# Patient Record
Sex: Female | Born: 1950 | Race: White | Hispanic: No | Smoking: Never smoker
Health system: Southern US, Community
[De-identification: ages and names within clinical notes are randomized; demographics above are authoritative.]

## PROBLEM LIST (undated history)

## (undated) DIAGNOSIS — M810 Age-related osteoporosis without current pathological fracture: Secondary | ICD-10-CM

## (undated) DIAGNOSIS — M069 Rheumatoid arthritis, unspecified: Secondary | ICD-10-CM

## (undated) DIAGNOSIS — J90 Pleural effusion, not elsewhere classified: Secondary | ICD-10-CM

## (undated) DIAGNOSIS — E7800 Pure hypercholesterolemia, unspecified: Secondary | ICD-10-CM

## (undated) DIAGNOSIS — G43909 Migraine, unspecified, not intractable, without status migrainosus: Secondary | ICD-10-CM

## (undated) DIAGNOSIS — C439 Malignant melanoma of skin, unspecified: Secondary | ICD-10-CM

## (undated) DIAGNOSIS — J189 Pneumonia, unspecified organism: Secondary | ICD-10-CM

## (undated) HISTORY — DX: Migraine, unspecified, not intractable, without status migrainosus: G43.909

## (undated) HISTORY — PX: FINGER SURGERY: SHX640

## (undated) HISTORY — DX: Pure hypercholesterolemia, unspecified: E78.00

## (undated) HISTORY — DX: Age-related osteoporosis without current pathological fracture: M81.0

## (undated) HISTORY — DX: Rheumatoid arthritis, unspecified: M06.9

## (undated) HISTORY — DX: Malignant melanoma of skin, unspecified: C43.9

---

## 1998-01-04 ENCOUNTER — Other Ambulatory Visit: Admission: RE | Admit: 1998-01-04 | Discharge: 1998-01-04 | Payer: Self-pay | Admitting: Obstetrics and Gynecology

## 1999-08-08 ENCOUNTER — Other Ambulatory Visit: Admission: RE | Admit: 1999-08-08 | Discharge: 1999-08-08 | Payer: Self-pay | Admitting: Obstetrics and Gynecology

## 2002-08-02 ENCOUNTER — Other Ambulatory Visit: Admission: RE | Admit: 2002-08-02 | Discharge: 2002-08-02 | Payer: Self-pay | Admitting: Obstetrics and Gynecology

## 2002-10-27 ENCOUNTER — Ambulatory Visit (HOSPITAL_BASED_OUTPATIENT_CLINIC_OR_DEPARTMENT_OTHER): Admission: RE | Admit: 2002-10-27 | Discharge: 2002-10-27 | Payer: Self-pay | Admitting: Orthopedic Surgery

## 2002-10-28 ENCOUNTER — Encounter (INDEPENDENT_AMBULATORY_CARE_PROVIDER_SITE_OTHER): Payer: Self-pay | Admitting: Specialist

## 2004-03-05 ENCOUNTER — Other Ambulatory Visit: Admission: RE | Admit: 2004-03-05 | Discharge: 2004-03-05 | Payer: Self-pay | Admitting: Obstetrics and Gynecology

## 2005-03-14 ENCOUNTER — Ambulatory Visit: Payer: Self-pay | Admitting: Sports Medicine

## 2005-04-25 ENCOUNTER — Ambulatory Visit: Payer: Self-pay | Admitting: Sports Medicine

## 2005-07-30 ENCOUNTER — Ambulatory Visit (HOSPITAL_COMMUNITY): Admission: RE | Admit: 2005-07-30 | Discharge: 2005-07-30 | Payer: Self-pay | Admitting: Gastroenterology

## 2006-09-07 ENCOUNTER — Other Ambulatory Visit: Admission: RE | Admit: 2006-09-07 | Discharge: 2006-09-07 | Payer: Self-pay | Admitting: Obstetrics and Gynecology

## 2009-10-02 ENCOUNTER — Ambulatory Visit: Payer: Self-pay | Admitting: Obstetrics and Gynecology

## 2009-10-15 ENCOUNTER — Other Ambulatory Visit: Admission: RE | Admit: 2009-10-15 | Discharge: 2009-10-15 | Payer: Self-pay | Admitting: Obstetrics and Gynecology

## 2009-10-15 ENCOUNTER — Ambulatory Visit: Payer: Self-pay | Admitting: Obstetrics and Gynecology

## 2009-12-04 ENCOUNTER — Ambulatory Visit: Payer: Self-pay | Admitting: Obstetrics and Gynecology

## 2009-12-07 ENCOUNTER — Ambulatory Visit: Payer: Self-pay | Admitting: Obstetrics and Gynecology

## 2010-11-29 NOTE — Op Note (Signed)
NAMEMELORA, MENON                              ACCOUNT NO.:  [id number]   MEDICAL RECORD NO.:  [id number]                   PATIENT TYPE:  AMB   LOCATION:  DSC                                  FACILITY:  [facility]   PHYSICIAN:  Katy Fitch. Naaman Plummer., M.D.          DATE OF BIRTH:  [birth year]   DATE OF PROCEDURE:  10/27/2002  DATE OF DISCHARGE:                                 OPERATIVE REPORT   PREOPERATIVE DIAGNOSIS:  Ossifying mass, right long finger volar aspect P2.   POSTOPERATIVE DIAGNOSIS:  Ossifying mass, right long finger volar aspect P2.   OPERATION PERFORMED:  Excisional biopsy of an ossifying mass on volar radial  aspect of right long finger involving neurovascular bundle, rule out  probable ossifying chondroma.   SURGEON:  Katy Fitch. Sypher, M.D.   ASSISTANT:  Jonni Sanger, P.A.   ANESTHESIA:  IV regional forearm level.   SUPERVISING ANESTHESIOLOGIST:  Judie Petit, M.D.   INDICATIONS FOR PROCEDURE:  The patient is a 60 year old woman who presented  for evaluation and management of a firm mass on the volar aspect of her  right long finger.  She had a history of this enlarging over the past  several years.  This was painless.  She had no history of injury,  specifically, no history of a penetrating injury.   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  After informed consent, she was brought to  the operating room at this time for excisional biopsy.   DESCRIPTION OF PROCEDURE:  The patient was brought to the operating room and  placed in supine position upon the operating table.  Following placement of  an IV regional block at the proximal forearm level, the right arm was  prepped with Betadine soap and solution and sterilely draped.  Following  confirmation of satisfactory anesthesia, a Brunner zigzag incision was  fashioned in a manner to allow VY flap creation to reduce the skin mass.  Subcutaneous  tissues were carefully divided taking care to identify the  radial neurovascular bundle.  The mass was circumferentially dissected and  found to have a pedicle emanating from the volar aspect of the PIP joint.  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   This was circumferentially dissected and gently teased off the radial proper  digital nerve.  The wound was then irrigated.  A delta shaped portion of the  skin was removed to relieve the redundant dermis followed by repair of the  skin with a corner suture of 5-0 nylon and horizontal mattress suture of 5-0  nylon.  Skin was dressed with Xeroflo, sterile gauze and Coban.   For aftercare, the patient was given a prescription for Vicodin p.r.n. pain.  She will return to our office in follow-up for one week.  Pathology results  should be available within four days.                                               Katy Fitch Naaman Plummer., M.D.    RVS/MEDQ  D:  10/27/2002  T:  10/27/2002  Job:  [id number]

## 2010-11-29 NOTE — Op Note (Signed)
[name] [name], ROGEL                  ACCOUNT NO.:  [id number]   MEDICAL RECORD NO.:  [id number]          PATIENT TYPE:  AMB   LOCATION:  ENDO                         FACILITY:  [facility]   PHYSICIAN:  Danise Edge, M.D.   DATE OF BIRTH:  [birth year]   DATE OF PROCEDURE:  07/30/2005  DATE OF DISCHARGE:                                 OPERATIVE REPORT   PROCEDURE:  Screening colonoscopy.Marland Kitchen   REFERRING PHYSICIAN:  Erskine Speed, M.D. and Rande Brunt. Eda Paschal, M.D.   INDICATIONS:  Ms. Lilinoe Acklin is a 60 year old female born May 11, 1951.  Ms. Wilmes is scheduled to undergo her first screening colonoscopy with  polypectomy to prevent colon cancer.   ENDOSCOPIST:  Danise Edge, M.D.   PREMEDICATION:  Versed 8.5 mg, Demerol 60 mg.   DESCRIPTION OF PROCEDURE:  After obtaining informed consent, Ms. Tocci was  placed in the left lateral decubitus position. I administered intravenous  Demerol and intravenous Versed to achieve conscious sedation for the  procedure. The patient's blood pressure, oxygen saturation and cardiac  rhythm were monitored throughout the procedure and documented in the medical  record.   Anal inspection and digital rectal exam were normal. The Olympus adjustable  pediatric colonoscope was introduced into the rectum and advanced to the  cecum. A normal-appearing ileocecal valve and appendiceal orifice were  identified. Colonic preparation for the exam today was excellent.   RECTUM:  Normal. Retroflexed view of the distal rectum normal.  SIGMOID COLON AND DESCENDING COLON:  Normal.  SPLENIC FLEXURE:  Normal.  TRANSVERSE COLON:  Normal.  HEPATIC FLEXURE:  Normal.  ASCENDING COLON:  Normal.  CECUM AND ILEOCECAL VALVE:  Normal.   ASSESSMENT:  Normal screening proctocolonoscopy to the cecum. No endoscopic  evidence for the presence of colorectal neoplasia.           ______________________________  Danise Edge, M.D.     MJ/MEDQ  D:  07/30/2005  T:  07/30/2005   Job:  [id number]   cc:   Erskine Speed, M.D.  Fax: [phone]   Rande Brunt. Eda Paschal, M.D.  Fax: [phone]

## 2010-12-16 ENCOUNTER — Encounter (INDEPENDENT_AMBULATORY_CARE_PROVIDER_SITE_OTHER): Payer: BC Managed Care – PPO | Admitting: Obstetrics and Gynecology

## 2010-12-16 DIAGNOSIS — N390 Urinary tract infection, site not specified: Secondary | ICD-10-CM

## 2010-12-16 DIAGNOSIS — R82998 Other abnormal findings in urine: Secondary | ICD-10-CM

## 2010-12-23 ENCOUNTER — Other Ambulatory Visit: Payer: BC Managed Care – PPO

## 2011-01-16 ENCOUNTER — Other Ambulatory Visit: Payer: Self-pay | Admitting: Internal Medicine

## 2011-01-16 DIAGNOSIS — R29810 Facial weakness: Secondary | ICD-10-CM

## 2011-01-16 DIAGNOSIS — H547 Unspecified visual loss: Secondary | ICD-10-CM

## 2011-01-17 ENCOUNTER — Ambulatory Visit
Admission: RE | Admit: 2011-01-17 | Discharge: 2011-01-17 | Disposition: A | Discharge status: Home / Self Care | Payer: BC Managed Care – PPO | Source: Ambulatory Visit | Attending: Internal Medicine | Admitting: Internal Medicine

## 2011-01-17 DIAGNOSIS — R29810 Facial weakness: Secondary | ICD-10-CM

## 2011-01-17 DIAGNOSIS — H547 Unspecified visual loss: Secondary | ICD-10-CM

## 2011-01-20 ENCOUNTER — Other Ambulatory Visit: Payer: BC Managed Care – PPO

## 2011-01-23 ENCOUNTER — Ambulatory Visit (INDEPENDENT_AMBULATORY_CARE_PROVIDER_SITE_OTHER): Payer: BC Managed Care – PPO | Admitting: Obstetrics and Gynecology

## 2011-01-23 DIAGNOSIS — R350 Frequency of micturition: Secondary | ICD-10-CM

## 2011-01-23 DIAGNOSIS — N390 Urinary tract infection, site not specified: Secondary | ICD-10-CM

## 2011-02-07 ENCOUNTER — Ambulatory Visit (INDEPENDENT_AMBULATORY_CARE_PROVIDER_SITE_OTHER): Payer: BC Managed Care – PPO | Admitting: Obstetrics and Gynecology

## 2011-02-07 DIAGNOSIS — Z5189 Encounter for other specified aftercare: Secondary | ICD-10-CM

## 2011-02-07 DIAGNOSIS — R82998 Other abnormal findings in urine: Secondary | ICD-10-CM

## 2011-03-11 ENCOUNTER — Encounter: Payer: Self-pay | Admitting: Gynecology

## 2011-03-11 DIAGNOSIS — N952 Postmenopausal atrophic vaginitis: Secondary | ICD-10-CM | POA: Insufficient documentation

## 2011-03-20 ENCOUNTER — Ambulatory Visit (INDEPENDENT_AMBULATORY_CARE_PROVIDER_SITE_OTHER): Payer: BC Managed Care – PPO | Admitting: Obstetrics and Gynecology

## 2011-03-20 ENCOUNTER — Other Ambulatory Visit (HOSPITAL_COMMUNITY)
Admission: RE | Admit: 2011-03-20 | Discharge: 2011-03-20 | Disposition: A | Discharge status: Home / Self Care | Payer: BC Managed Care – PPO | Source: Ambulatory Visit | Attending: Obstetrics and Gynecology | Admitting: Obstetrics and Gynecology

## 2011-03-20 ENCOUNTER — Encounter: Payer: Self-pay | Admitting: Obstetrics and Gynecology

## 2011-03-20 VITALS — BP 104/60 | Ht 66.5 in | Wt 140.0 lb

## 2011-03-20 DIAGNOSIS — Z01419 Encounter for gynecological examination (general) (routine) without abnormal findings: Secondary | ICD-10-CM | POA: Insufficient documentation

## 2011-03-20 DIAGNOSIS — N950 Postmenopausal bleeding: Secondary | ICD-10-CM

## 2011-03-20 NOTE — Progress Notes (Signed)
The patient came to see me today for an annual GYN exam. She does not take HRT to 3 weeks ago she had 2 or 3 episodes of light spotting. She's had none since. She is up-to-date on mammograms but we and get the report. She will have it sent to [place]. Her last bone density showed osteopenia without elevated fracture risk. She is having no pelvic pain.  Physical examination: HEENT within normal limits. Neck: Thyroid not large. No masses. Supraclavicular nodes: not enlarged. Breasts: Examined in both sitting midline position. No skin changes and no masses. Abdomen: Soft no guarding rebound or masses or hernia. Pelvic: External: Within normal limits. BUS: Within normal limits. Vaginal:within normal limits. Poor estrogen effect. No evidence of cystocele rectocele or enterocele. Cervix: clean. Uterus: Normal size and shape. Adnexa: No masses. Rectovaginal exam: Confirmatory and negative. Extremities: Within normal limits.  Assessment: Possible postmenopausal bleeding. Osteopenia.  Plan: Endometrial biopsy done. Patient had not gotten [place] a urine before the biopsy so we decided to wait till next week to do a urinalysis so it would not be contaminated. I will see her that same day and give her her biopsy report and we will decide what else needs to be done.

## 2011-03-21 NOTE — Progress Notes (Signed)
Addended byCammie Mcgee T on: 03/21/2011 02:16 PM   Modules accepted: Orders

## 2011-03-27 ENCOUNTER — Ambulatory Visit (INDEPENDENT_AMBULATORY_CARE_PROVIDER_SITE_OTHER): Payer: BC Managed Care – PPO | Admitting: Obstetrics and Gynecology

## 2011-03-27 DIAGNOSIS — R300 Dysuria: Secondary | ICD-10-CM

## 2011-03-27 DIAGNOSIS — R82998 Other abnormal findings in urine: Secondary | ICD-10-CM

## 2011-03-27 DIAGNOSIS — N950 Postmenopausal bleeding: Secondary | ICD-10-CM

## 2011-03-27 NOTE — Progress Notes (Signed)
Patient came back today for followup. She's had no more vaginal bleeding. We did a urinalysis and she did not have microscopic hematuria. Her Pap smear and endometrial biopsy were fine. We discussed proceeding with an SIH to rule out an occult  Polyp. Patient has elected to wait. She will call immediately if she has any bleeding and we will schedule SIH.

## 2012-08-31 ENCOUNTER — Other Ambulatory Visit: Payer: Self-pay | Admitting: Gynecology

## 2012-08-31 DIAGNOSIS — M8580 Other specified disorders of bone density and structure, unspecified site: Secondary | ICD-10-CM

## 2012-09-02 ENCOUNTER — Ambulatory Visit (INDEPENDENT_AMBULATORY_CARE_PROVIDER_SITE_OTHER): Payer: BC Managed Care – PPO

## 2012-09-02 DIAGNOSIS — M8580 Other specified disorders of bone density and structure, unspecified site: Secondary | ICD-10-CM

## 2012-09-02 DIAGNOSIS — M899 Disorder of bone, unspecified: Secondary | ICD-10-CM

## 2012-09-06 ENCOUNTER — Encounter: Payer: Self-pay | Admitting: Gynecology

## 2012-09-24 ENCOUNTER — Encounter: Payer: Self-pay | Admitting: *Deleted

## 2012-10-20 ENCOUNTER — Ambulatory Visit (INDEPENDENT_AMBULATORY_CARE_PROVIDER_SITE_OTHER): Payer: BC Managed Care – PPO | Admitting: Gynecology

## 2012-10-20 ENCOUNTER — Encounter: Payer: Self-pay | Admitting: Gynecology

## 2012-10-20 VITALS — BP 120/76 | Ht 66.0 in | Wt 139.0 lb

## 2012-10-20 DIAGNOSIS — M949 Disorder of cartilage, unspecified: Secondary | ICD-10-CM

## 2012-10-20 DIAGNOSIS — N952 Postmenopausal atrophic vaginitis: Secondary | ICD-10-CM

## 2012-10-20 DIAGNOSIS — M8580 Other specified disorders of bone density and structure, unspecified site: Secondary | ICD-10-CM

## 2012-10-20 DIAGNOSIS — M899 Disorder of bone, unspecified: Secondary | ICD-10-CM

## 2012-10-20 DIAGNOSIS — Z01419 Encounter for gynecological examination (general) (routine) without abnormal findings: Secondary | ICD-10-CM

## 2012-10-20 NOTE — Progress Notes (Signed)
[name] [name] October 19, 1950 [id number]        62 y.o.  G2P2002 for annual exam.  Doing well without complaints. Former patient of Dr. Eda Paschal.  Past medical history,surgical history, medications, allergies, family history and social history were all reviewed and documented in the EPIC chart. ROS:  Was performed and pertinent positives and negatives are included in the history.  Exam: [name] [name] assistant Filed Vitals:   10/20/12 0923  BP: 120/76  Height: 5\' 6"  (1.676 m)  Weight: 139 lb (63.05 kg)   General appearance  Normal Skin grossly normal Head/Neck normal with no cervical or supraclavicular adenopathy thyroid normal Lungs  clear Cardiac RR, without RMG Abdominal  soft, nontender, without masses, organomegaly or hernia Breasts  examined lying and sitting without masses, retractions, discharge or axillary adenopathy. Pelvic  Ext/BUS/vagina  normal with atrophic changes  Cervix  normal with atrophic changes  Uterus  anteverted, normal size, shape and contour, midline and mobile nontender   Adnexa  Without masses or tenderness    Anus and perineum  normal   Rectovaginal  normal sphincter tone without palpated masses or tenderness.    Assessment/Plan:  62 y.o. G31P2002 female for annual exam.   1. Postmenopausal. Doing well without significant hot flashes, night sweats, vaginal dryness or irritation. Continue expectant management. Follow up if any issues. 2. Osteopenia.  DEXA 08/2012 with T score -2.1 FRAX 19%/1.7%. Stable at the AP spine and left hip. 4% loss on the right hip from prior study. Reviewed all this with the patient. Calcium/vitamin D recommendations reviewed. She does exercise routinely. Recommend vitamin D level today. Repeat DEXA in 2 years. Probable need for treatment as we move forward reviewed with her. 3. Mammography 2013. Patient will follow up in 2014 for her mammogram. SBE monthly reviewed. 4. Pap smear 2012. No Pap smear done today. No history of abnormal Pap smears.  Plan repeat next year at 3 year interval. 5. Colonoscopy 8 years ago. Due to repeat a 10 year interval. 6. Health maintenance. Routinely sees Dr. Chilton Si who does all of her blood work. No other blood work done vitamin D done today. Follow up one year, sooner as needed.    [name] Lords MD, 10:12 AM 10/20/2012

## 2012-10-20 NOTE — Patient Instructions (Signed)
Follow up one year, sooner as needed.

## 2012-10-26 ENCOUNTER — Encounter: Payer: Self-pay | Admitting: Obstetrics and Gynecology

## 2013-02-25 ENCOUNTER — Ambulatory Visit
Admission: RE | Admit: 2013-02-25 | Discharge: 2013-02-25 | Disposition: A | Discharge status: Home / Self Care | Payer: BC Managed Care – PPO | Source: Ambulatory Visit | Attending: Internal Medicine | Admitting: Internal Medicine

## 2013-02-25 ENCOUNTER — Other Ambulatory Visit: Payer: Self-pay | Admitting: Internal Medicine

## 2013-02-25 DIAGNOSIS — R52 Pain, unspecified: Secondary | ICD-10-CM

## 2013-07-14 DIAGNOSIS — M810 Age-related osteoporosis without current pathological fracture: Secondary | ICD-10-CM

## 2013-07-14 HISTORY — DX: Age-related osteoporosis without current pathological fracture: M81.0

## 2013-08-03 ENCOUNTER — Other Ambulatory Visit: Payer: Self-pay | Admitting: Dermatology

## 2013-11-14 ENCOUNTER — Inpatient Hospital Stay (HOSPITAL_COMMUNITY): Payer: No Typology Code available for payment source

## 2013-11-14 ENCOUNTER — Emergency Department (HOSPITAL_COMMUNITY): Payer: No Typology Code available for payment source

## 2013-11-14 ENCOUNTER — Other Ambulatory Visit: Payer: Self-pay | Admitting: Internal Medicine

## 2013-11-14 ENCOUNTER — Encounter (HOSPITAL_COMMUNITY): Payer: No Typology Code available for payment source | Admitting: Anesthesiology

## 2013-11-14 ENCOUNTER — Encounter (HOSPITAL_COMMUNITY)
Admission: EM | Disposition: A | Discharge status: Home Health | Payer: Self-pay | Source: Home / Self Care | Attending: Internal Medicine

## 2013-11-14 ENCOUNTER — Inpatient Hospital Stay (HOSPITAL_COMMUNITY)
Admission: EM | Admit: 2013-11-14 | Discharge: 2013-11-15 | DRG: 482 | Disposition: A | Discharge status: Home Health | Payer: No Typology Code available for payment source | Attending: Internal Medicine | Admitting: Internal Medicine

## 2013-11-14 ENCOUNTER — Inpatient Hospital Stay (HOSPITAL_COMMUNITY): Payer: No Typology Code available for payment source | Admitting: Anesthesiology

## 2013-11-14 ENCOUNTER — Ambulatory Visit
Admission: RE | Admit: 2013-11-14 | Discharge: 2013-11-14 | Disposition: A | Discharge status: Home / Self Care | Payer: No Typology Code available for payment source | Source: Ambulatory Visit | Attending: Internal Medicine | Admitting: Internal Medicine

## 2013-11-14 ENCOUNTER — Encounter (HOSPITAL_COMMUNITY): Payer: Self-pay | Admitting: Emergency Medicine

## 2013-11-14 DIAGNOSIS — Y93K1 Activity, walking an animal: Secondary | ICD-10-CM

## 2013-11-14 DIAGNOSIS — Z8249 Family history of ischemic heart disease and other diseases of the circulatory system: Secondary | ICD-10-CM

## 2013-11-14 DIAGNOSIS — E785 Hyperlipidemia, unspecified: Secondary | ICD-10-CM | POA: Diagnosis present

## 2013-11-14 DIAGNOSIS — W19XXXA Unspecified fall, initial encounter: Secondary | ICD-10-CM

## 2013-11-14 DIAGNOSIS — M8580 Other specified disorders of bone density and structure, unspecified site: Secondary | ICD-10-CM

## 2013-11-14 DIAGNOSIS — N952 Postmenopausal atrophic vaginitis: Secondary | ICD-10-CM

## 2013-11-14 DIAGNOSIS — Z7982 Long term (current) use of aspirin: Secondary | ICD-10-CM

## 2013-11-14 DIAGNOSIS — M899 Disorder of bone, unspecified: Secondary | ICD-10-CM | POA: Diagnosis present

## 2013-11-14 DIAGNOSIS — S72001A Fracture of unspecified part of neck of right femur, initial encounter for closed fracture: Secondary | ICD-10-CM | POA: Diagnosis present

## 2013-11-14 DIAGNOSIS — S72033A Displaced midcervical fracture of unspecified femur, initial encounter for closed fracture: Principal | ICD-10-CM | POA: Diagnosis present

## 2013-11-14 DIAGNOSIS — Z79899 Other long term (current) drug therapy: Secondary | ICD-10-CM

## 2013-11-14 DIAGNOSIS — M949 Disorder of cartilage, unspecified: Secondary | ICD-10-CM

## 2013-11-14 HISTORY — PX: HIP ARTHROPLASTY: SHX981

## 2013-11-14 LAB — CBC WITH DIFFERENTIAL/PLATELET
Basophils Absolute: 0 10*3/uL (ref 0.0–0.1)
Basophils Relative: 0 % (ref 0–1)
EOS PCT: 1 % (ref 0–5)
Eosinophils Absolute: 0.1 10*3/uL (ref 0.0–0.7)
HEMATOCRIT: 40 % (ref 36.0–46.0)
Hemoglobin: 13.6 g/dL (ref 12.0–15.0)
LYMPHS ABS: 1.7 10*3/uL (ref 0.7–4.0)
LYMPHS PCT: 12 % (ref 12–46)
MCH: 32.5 pg (ref 26.0–34.0)
MCHC: 34 g/dL (ref 30.0–36.0)
MCV: 95.5 fL (ref 78.0–100.0)
MONO ABS: 0.8 10*3/uL (ref 0.1–1.0)
Monocytes Relative: 6 % (ref 3–12)
NEUTROS ABS: 11.6 10*3/uL — AB (ref 1.7–7.7)
Neutrophils Relative %: 81 % — ABNORMAL HIGH (ref 43–77)
PLATELETS: 268 10*3/uL (ref 150–400)
RBC: 4.19 MIL/uL (ref 3.87–5.11)
RDW: 12.3 % (ref 11.5–15.5)
WBC: 14.3 10*3/uL — AB (ref 4.0–10.5)

## 2013-11-14 LAB — BASIC METABOLIC PANEL
BUN: 23 mg/dL (ref 6–23)
CHLORIDE: 95 meq/L — AB (ref 96–112)
CO2: 26 mEq/L (ref 19–32)
Calcium: 9.5 mg/dL (ref 8.4–10.5)
Creatinine, Ser: 0.97 mg/dL (ref 0.50–1.10)
GFR calc non Af Amer: 60 mL/min — ABNORMAL LOW (ref >90)
GFR, EST AFRICAN AMERICAN: 70 mL/min — AB (ref >90)
Glucose, Bld: 108 mg/dL — ABNORMAL HIGH (ref 70–99)
Potassium: 3.9 mEq/L (ref 3.7–5.3)
SODIUM: 136 meq/L — AB (ref 137–147)

## 2013-11-14 LAB — PROTIME-INR
INR: 0.97 (ref 0.00–1.49)
PROTHROMBIN TIME: 12.7 s (ref 11.6–15.2)

## 2013-11-14 LAB — TYPE AND SCREEN
ABO/RH(D): O POS
ANTIBODY SCREEN: NEGATIVE

## 2013-11-14 LAB — ABO/RH: ABO/RH(D): O POS

## 2013-11-14 SURGERY — HEMIARTHROPLASTY, HIP, DIRECT ANTERIOR APPROACH, FOR FRACTURE
Anesthesia: General | Site: Hip | Laterality: Right

## 2013-11-14 MED ORDER — FENTANYL CITRATE 0.05 MG/ML IJ SOLN
INTRAMUSCULAR | Status: AC
  Filled 2013-11-14: qty 5

## 2013-11-14 MED ORDER — SODIUM CHLORIDE 0.9 % IV SOLN
1000.0000 mL | Freq: Once | INTRAVENOUS | Status: AC
  Administered 2013-11-14: 1000 mL via INTRAVENOUS

## 2013-11-14 MED ORDER — OXYCODONE-ACETAMINOPHEN 5-325 MG PO TABS: 1.0000 | ORAL_TABLET | Freq: Four times a day (QID) | ORAL | Status: DC | PRN

## 2013-11-14 MED ORDER — GLYCOPYRROLATE 0.2 MG/ML IJ SOLN
INTRAMUSCULAR | Status: DC | PRN
  Administered 2013-11-14: 0.4 mg via INTRAVENOUS

## 2013-11-14 MED ORDER — CEFAZOLIN SODIUM-DEXTROSE 2-3 GM-% IV SOLR
INTRAVENOUS | Status: DC | PRN
  Administered 2013-11-14: 2 g via INTRAVENOUS

## 2013-11-14 MED ORDER — SODIUM CHLORIDE 0.9 % IJ SOLN
3.0000 mL | Freq: Two times a day (BID) | INTRAMUSCULAR | Status: DC
  Administered 2013-11-15: 3 mL via INTRAVENOUS

## 2013-11-14 MED ORDER — MORPHINE SULFATE 4 MG/ML IJ SOLN
4.0000 mg | Freq: Once | INTRAMUSCULAR | Status: AC
Start: 2013-11-14 — End: 2013-11-14
  Administered 2013-11-14: 4 mg via INTRAVENOUS
  Filled 2013-11-14: qty 1

## 2013-11-14 MED ORDER — SUCCINYLCHOLINE CHLORIDE 20 MG/ML IJ SOLN
INTRAMUSCULAR | Status: DC | PRN
  Administered 2013-11-14: 100 mg via INTRAVENOUS

## 2013-11-14 MED ORDER — SIMVASTATIN 40 MG PO TABS
40.0000 mg | ORAL_TABLET | Freq: Every day | ORAL | Status: DC
  Filled 2013-11-14: qty 1

## 2013-11-14 MED ORDER — MIDAZOLAM HCL 5 MG/5ML IJ SOLN
INTRAMUSCULAR | Status: DC | PRN
  Administered 2013-11-14: 2 mg via INTRAVENOUS

## 2013-11-14 MED ORDER — ONDANSETRON HCL 4 MG PO TABS: 4.0000 mg | ORAL_TABLET | Freq: Four times a day (QID) | ORAL | Status: DC | PRN

## 2013-11-14 MED ORDER — BUPIVACAINE HCL (PF) 0.25 % IJ SOLN
INTRAMUSCULAR | Status: AC
  Filled 2013-11-14: qty 30

## 2013-11-14 MED ORDER — FENTANYL CITRATE 0.05 MG/ML IJ SOLN
INTRAMUSCULAR | Status: DC | PRN
  Administered 2013-11-14: 50 ug via INTRAVENOUS
  Administered 2013-11-14: 100 ug via INTRAVENOUS
  Administered 2013-11-14: 50 ug via INTRAVENOUS

## 2013-11-14 MED ORDER — MIDAZOLAM HCL 2 MG/2ML IJ SOLN
INTRAMUSCULAR | Status: AC
  Filled 2013-11-14: qty 2

## 2013-11-14 MED ORDER — DOCUSATE SODIUM 100 MG PO CAPS
100.0000 mg | ORAL_CAPSULE | Freq: Two times a day (BID) | ORAL | Status: DC
  Administered 2013-11-15: 100 mg via ORAL

## 2013-11-14 MED ORDER — MORPHINE SULFATE 4 MG/ML IJ SOLN: 4.0000 mg | INTRAMUSCULAR | Status: DC | PRN

## 2013-11-14 MED ORDER — ASPIRIN EC 325 MG PO TBEC: 325.0000 mg | DELAYED_RELEASE_TABLET | Freq: Two times a day (BID) | ORAL | Status: DC

## 2013-11-14 MED ORDER — 0.9 % SODIUM CHLORIDE (POUR BTL) OPTIME
TOPICAL | Status: DC | PRN
  Administered 2013-11-14: 1000 mL

## 2013-11-14 MED ORDER — LACTATED RINGERS IV SOLN: INTRAVENOUS | Status: DC

## 2013-11-14 MED ORDER — OXYCODONE HCL 5 MG PO TABS
5.0000 mg | ORAL_TABLET | ORAL | Status: DC | PRN
  Administered 2013-11-15 (×2): 5 mg via ORAL
  Filled 2013-11-14 (×3): qty 1

## 2013-11-14 MED ORDER — ASPIRIN EC 325 MG PO TBEC
325.0000 mg | DELAYED_RELEASE_TABLET | Freq: Two times a day (BID) | ORAL | Status: DC
  Administered 2013-11-15: 325 mg via ORAL
  Filled 2013-11-14 (×3): qty 1

## 2013-11-14 MED ORDER — CEFAZOLIN SODIUM-DEXTROSE 2-3 GM-% IV SOLR
INTRAVENOUS | Status: AC
  Filled 2013-11-14: qty 50

## 2013-11-14 MED ORDER — GLYCOPYRROLATE 0.2 MG/ML IJ SOLN
INTRAMUSCULAR | Status: AC
Start: 2013-11-14 — End: 2013-11-14
  Filled 2013-11-14: qty 2

## 2013-11-14 MED ORDER — NEOSTIGMINE METHYLSULFATE 10 MG/10ML IV SOLN
INTRAVENOUS | Status: DC | PRN
  Administered 2013-11-14: 3 mg via INTRAVENOUS

## 2013-11-14 MED ORDER — ACETAMINOPHEN 325 MG PO TABS: 650.0000 mg | ORAL_TABLET | Freq: Four times a day (QID) | ORAL | Status: DC | PRN

## 2013-11-14 MED ORDER — CEFAZOLIN SODIUM-DEXTROSE 2-3 GM-% IV SOLR
2.0000 g | Freq: Four times a day (QID) | INTRAVENOUS | Status: AC
  Administered 2013-11-15 (×2): 2 g via INTRAVENOUS
  Filled 2013-11-14 (×2): qty 50

## 2013-11-14 MED ORDER — PROPOFOL 10 MG/ML IV BOLUS
INTRAVENOUS | Status: DC | PRN
  Administered 2013-11-14: 150 mg via INTRAVENOUS

## 2013-11-14 MED ORDER — ONDANSETRON HCL 4 MG/2ML IJ SOLN
4.0000 mg | Freq: Once | INTRAMUSCULAR | Status: AC
  Administered 2013-11-14: 4 mg via INTRAVENOUS
  Filled 2013-11-14: qty 2

## 2013-11-14 MED ORDER — ROCURONIUM BROMIDE 100 MG/10ML IV SOLN
INTRAVENOUS | Status: DC | PRN
  Administered 2013-11-14: 25 mg via INTRAVENOUS

## 2013-11-14 MED ORDER — LIDOCAINE HCL (CARDIAC) 20 MG/ML IV SOLN
INTRAVENOUS | Status: AC
  Filled 2013-11-14: qty 5

## 2013-11-14 MED ORDER — SODIUM CHLORIDE 0.9 % IV SOLN: 250.0000 mL | INTRAVENOUS | Status: DC | PRN

## 2013-11-14 MED ORDER — ONDANSETRON HCL 4 MG/2ML IJ SOLN: 4.0000 mg | Freq: Four times a day (QID) | INTRAMUSCULAR | Status: DC | PRN

## 2013-11-14 MED ORDER — METHOCARBAMOL 500 MG PO TABS
500.0000 mg | ORAL_TABLET | Freq: Four times a day (QID) | ORAL | Status: DC | PRN
  Administered 2013-11-15 (×2): 500 mg via ORAL
  Filled 2013-11-14 (×2): qty 1

## 2013-11-14 MED ORDER — SODIUM CHLORIDE 0.9 % IV SOLN
INTRAVENOUS | Status: DC
  Administered 2013-11-15: via INTRAVENOUS

## 2013-11-14 MED ORDER — ACETAMINOPHEN 650 MG RE SUPP: 650.0000 mg | Freq: Four times a day (QID) | RECTAL | Status: DC | PRN

## 2013-11-14 MED ORDER — SENNA 8.6 MG PO TABS
1.0000 | ORAL_TABLET | Freq: Two times a day (BID) | ORAL | Status: DC
  Administered 2013-11-15: 8.6 mg via ORAL

## 2013-11-14 MED ORDER — BUPIVACAINE HCL (PF) 0.25 % IJ SOLN
INTRAMUSCULAR | Status: DC | PRN
  Administered 2013-11-14: 10 mL

## 2013-11-14 MED ORDER — PHENYLEPHRINE HCL 10 MG/ML IJ SOLN
INTRAMUSCULAR | Status: DC | PRN
  Administered 2013-11-14 (×4): 80 ug via INTRAVENOUS

## 2013-11-14 MED ORDER — PROPOFOL 10 MG/ML IV BOLUS
INTRAVENOUS | Status: AC
Start: 2013-11-14 — End: 2013-11-14
  Filled 2013-11-14: qty 20

## 2013-11-14 MED ORDER — SODIUM CHLORIDE 0.9 % IJ SOLN: 3.0000 mL | INTRAMUSCULAR | Status: DC | PRN

## 2013-11-14 MED ORDER — METHOCARBAMOL 1000 MG/10ML IJ SOLN
500.0000 mg | Freq: Four times a day (QID) | INTRAVENOUS | Status: DC | PRN
  Filled 2013-11-14: qty 5

## 2013-11-14 MED ORDER — LIDOCAINE HCL (CARDIAC) 20 MG/ML IV SOLN
INTRAVENOUS | Status: DC | PRN
  Administered 2013-11-14: 50 mg via INTRAVENOUS

## 2013-11-14 MED ORDER — SODIUM CHLORIDE 0.9 % IV SOLN: 1000.0000 mL | INTRAVENOUS | Status: DC

## 2013-11-14 MED ORDER — ONDANSETRON HCL 4 MG/2ML IJ SOLN
INTRAMUSCULAR | Status: DC | PRN
  Administered 2013-11-14: 4 mg via INTRAVENOUS

## 2013-11-14 MED ORDER — HYDROMORPHONE HCL PF 1 MG/ML IJ SOLN: 0.2500 mg | INTRAMUSCULAR | Status: DC | PRN

## 2013-11-14 MED ORDER — LACTATED RINGERS IV SOLN
INTRAVENOUS | Status: DC | PRN
Start: 2013-11-14 — End: 2013-11-14
  Administered 2013-11-14 (×2): via INTRAVENOUS

## 2013-11-14 SURGICAL SUPPLY — 54 items
BAG SPEC THK2 15X12 ZIP CLS (MISCELLANEOUS) ×1
BAG ZIPLOCK 12X15 (MISCELLANEOUS) ×3 IMPLANT
BIT DRILL 2.4X128 (BIT) ×1 IMPLANT
BIT DRILL 2.4X128MM (BIT)
BLADE SAW SAG 73X25 THK (BLADE)
BLADE SAW SGTL 73X25 THK (BLADE) ×1 IMPLANT
BRUSH FEMORAL CANAL (MISCELLANEOUS) IMPLANT
COVER SURGICAL LIGHT HANDLE (MISCELLANEOUS) ×5 IMPLANT
DRAPE INCISE IOBAN 66X45 STRL (DRAPES) ×1 IMPLANT
DRAPE LG THREE QUARTER DISP (DRAPES) ×1 IMPLANT
DRAPE ORTHO SPLIT 77X108 STRL (DRAPES)
DRAPE POUCH INSTRU U-SHP 10X18 (DRAPES) ×3 IMPLANT
DRAPE STERI IOBAN 125X83 (DRAPES) ×2 IMPLANT
DRAPE SURG ORHT 6 SPLT 77X108 (DRAPES) ×2 IMPLANT
DRSG MEPILEX BORDER 4X4 (GAUZE/BANDAGES/DRESSINGS) ×2 IMPLANT
DRSG MEPILEX BORDER 4X8 (GAUZE/BANDAGES/DRESSINGS) ×3 IMPLANT
DURAPREP 26ML APPLICATOR (WOUND CARE) ×3 IMPLANT
ELECT BLADE TIP CTD 4 INCH (ELECTRODE) ×1 IMPLANT
ELECT REM PT RETURN 9FT ADLT (ELECTROSURGICAL) ×3
ELECTRODE REM PT RTRN 9FT ADLT (ELECTROSURGICAL) ×1 IMPLANT
EVACUATOR 1/8 PVC DRAIN (DRAIN) IMPLANT
FACESHIELD LNG OPTICON STERILE (SAFETY) ×4 IMPLANT
GLOVE BIO SURGEON STRL SZ8 (GLOVE) ×6 IMPLANT
GLOVE ORTHO TXT STRL SZ7.5 (GLOVE) ×4 IMPLANT
HANDPIECE INTERPULSE COAX TIP (DISPOSABLE)
HOOD PEEL AWAY FACE SHEILD DIS (HOOD) ×2 IMPLANT
IMMOBILIZER KNEE 20 (SOFTGOODS) IMPLANT
MANIFOLD NEPTUNE II (INSTRUMENTS) ×3 IMPLANT
NEEDLE HYPO 22GX1.5 SAFETY (NEEDLE) ×3 IMPLANT
NS IRRIG 1000ML POUR BTL (IV SOLUTION) ×3 IMPLANT
PACK GENERAL/GYN (CUSTOM PROCEDURE TRAY) ×2 IMPLANT
PACK TOTAL JOINT (CUSTOM PROCEDURE TRAY) ×1 IMPLANT
PASSER SUT SWANSON 36MM LOOP (INSTRUMENTS) ×1 IMPLANT
PIN GUIDE DRILL TIP 2.8X300 (DRILL) ×6 IMPLANT
POSITIONER SURGICAL ARM (MISCELLANEOUS) ×3 IMPLANT
PRESSURIZER FEMORAL UNIV (MISCELLANEOUS) IMPLANT
SCREW CANN 8.0X100 HIP (Screw) ×2 IMPLANT
SCREW CANN 8.0X105 HIP (Screw) ×2 IMPLANT
SCREW CANN 8.0X85 HIP (Screw) ×2 IMPLANT
SET HNDPC FAN SPRY TIP SCT (DISPOSABLE) IMPLANT
STAPLER VISISTAT 35W (STAPLE) ×3 IMPLANT
SUCTION FRAZIER TIP 10 FR DISP (SUCTIONS) ×1 IMPLANT
SUT ETHIBOND NAB CT1 #1 30IN (SUTURE) ×4 IMPLANT
SUT VIC AB 0 CT1 36 (SUTURE) ×2 IMPLANT
SUT VIC AB 0 CTX 27 (SUTURE) ×2 IMPLANT
SUT VIC AB 1 CTX 36 (SUTURE)
SUT VIC AB 1 CTX36XBRD ANBCTR (SUTURE) ×4 IMPLANT
SUT VIC AB 2-0 CT1 27 (SUTURE)
SUT VIC AB 2-0 CT1 TAPERPNT 27 (SUTURE) ×3 IMPLANT
SYR CONTROL 10ML LL (SYRINGE) ×3 IMPLANT
TOWEL OR 17X26 10 PK STRL BLUE (TOWEL DISPOSABLE) ×3 IMPLANT
TOWER CARTRIDGE SMART MIX (DISPOSABLE) IMPLANT
TRAY FOLEY CATH 14FRSI W/METER (CATHETERS) ×2 IMPLANT
WATER STERILE IRR 1500ML POUR (IV SOLUTION) ×1 IMPLANT

## 2013-11-14 NOTE — ED Notes (Addendum)
Pt was sent by PCP, Nyoka Cowden MD, to [facility], due to pain.  Image of R femur showed a hip fracture.

## 2013-11-14 NOTE — Anesthesia Postprocedure Evaluation (Signed)
  Anesthesia Post-op Note  Patient: [name] [name]  Procedure(s) Performed: Procedure(s) (LRB): RIGHT CANNULATED HIP PINNING (Right)  Patient Location: PACU  Anesthesia Type: General  Level of Consciousness: awake and alert   Airway and Oxygen Therapy: Patient Spontanous Breathing  Post-op Pain: mild  Post-op Assessment: Post-op Vital signs reviewed, Patient's Cardiovascular Status Stable, Respiratory Function Stable, Patent Airway and No signs of Nausea or vomiting  Last Vitals:  Filed Vitals:   11/14/13 2315  BP:   Pulse:   Temp: 36.8 C  Resp:     Post-op Vital Signs: stable   Complications: No apparent anesthesia complications

## 2013-11-14 NOTE — Consult Note (Signed)
Reason for Consult:fall with hip pain Referring Physician: hospitalists  [name] [name] is an 63 y.o. female.  HPI: the patient is a 63 year old female who was walking her dog today and fell onto her right hip.  She presented to the emergency room with a nondisplaced femoral neck fracture.  We are consulted for management of the fracture.she will be admitted to the internal medicine service and we will manage her hip fracture.  Past Medical History  Diagnosis Date  . Osteopenia 08/2012    T score -2.1 FRAX 19%/1.7%  . Atrophic vaginitis   . Elevated cholesterol     Past Surgical History  Procedure Laterality Date  . Finger surgery      Family History  Problem Relation Age of Onset  . Heart disease Father     Social History:  reports that she has never smoked. She has never used smokeless tobacco. She reports that she does not drink alcohol or use illicit drugs.  Allergies:  Allergies  Allergen Reactions  . Cephalexin Rash    Medications: I have reviewed the patient's current medications.  Results for orders placed during the hospital encounter of 11/14/13 (from the past 48 hour(s))  BASIC METABOLIC PANEL     Status: Abnormal   Collection Time    11/14/13  7:20 PM      Result Value Ref Range   Sodium 136 (*) 137 - 147 mEq/L   Potassium 3.9  3.7 - 5.3 mEq/L   Chloride 95 (*) 96 - 112 mEq/L   CO2 26  19 - 32 mEq/L   Glucose, Bld 108 (*) 70 - 99 mg/dL   BUN 23  6 - 23 mg/dL   Creatinine, Ser 0.97  0.50 - 1.10 mg/dL   Calcium 9.5  8.4 - 10.5 mg/dL   GFR calc non Af Amer 60 (*) >90 mL/min   GFR calc Af Amer 70 (*) >90 mL/min   Comment: (NOTE)     The eGFR has been calculated using the CKD EPI equation.     This calculation has not been validated in all clinical situations.     eGFR's persistently <90 mL/min signify possible Chronic Kidney     Disease.  CBC WITH DIFFERENTIAL     Status: Abnormal   Collection Time    11/14/13  7:20 PM      Result Value Ref Range   WBC  14.3 (*) 4.0 - 10.5 K/uL   RBC 4.19  3.87 - 5.11 MIL/uL   Hemoglobin 13.6  12.0 - 15.0 g/dL   HCT 40.0  36.0 - 46.0 %   MCV 95.5  78.0 - 100.0 fL   MCH 32.5  26.0 - 34.0 pg   MCHC 34.0  30.0 - 36.0 g/dL   RDW 12.3  11.5 - 15.5 %   Platelets 268  150 - 400 K/uL   Neutrophils Relative % 81 (*) 43 - 77 %   Neutro Abs 11.6 (*) 1.7 - 7.7 K/uL   Lymphocytes Relative 12  12 - 46 %   Lymphs Abs 1.7  0.7 - 4.0 K/uL   Monocytes Relative 6  3 - 12 %   Monocytes Absolute 0.8  0.1 - 1.0 K/uL   Eosinophils Relative 1  0 - 5 %   Eosinophils Absolute 0.1  0.0 - 0.7 K/uL   Basophils Relative 0  0 - 1 %   Basophils Absolute 0.0  0.0 - 0.1 K/uL  PROTIME-INR     Status: None  Collection Time    11/14/13  7:20 PM      Result Value Ref Range   Prothrombin Time 12.7  11.6 - 15.2 seconds   INR 0.97  0.00 - 1.49    Dg Femur Right  11/14/2013   ADDENDUM REPORT: 11/14/2013 17:05  ADDENDUM: These results were called by telephone at the time of interpretation on 11/14/2013 at 5:00 PM to Dr. Levin Erp , who verbally acknowledged these results. The findings were also discussed directly with the patient. At Dr Rolly Salter direction, the patient is to report to the emergency department.   Electronically Signed   By: Julian Hy M.D.   On: 11/14/2013 17:05   11/14/2013   CLINICAL DATA:  Fall, right hip pain  EXAM: RIGHT FEMUR - 2 VIEW  COMPARISON:  None.  FINDINGS: Nondisplaced transcervical right femoral neck fracture.  The joint spaces are preserved.  Visualized bony pelvis appears intact.  No suprapatellar knee joint effusion.  IMPRESSION: Nondisplaced transcervical right femoral neck fracture.  Electronically Signed: By: Julian Hy M.D. On: 11/14/2013 16:58    ROS ROS: I have reviewed the patient's review of systems thoroughly and there are no positive responses as relates to the HPI. Exam: Blood pressure 115/52, pulse 79, temperature 98.2 F (36.8 C), temperature source Oral, resp. rate 18, last  menstrual period 07/14/1993, SpO2 100.00%. Physical Exam Well-developed well-nourished patient in no acute distress. Alert and oriented x3 HEENT:within normal limits Cardiac: Regular rate and rhythm Pulmonary: Lungs clear to auscultation Abdomen: Soft and nontender.  Normal active bowel sounds  Musculoskeletal: (right hip neurovascular intact distally.  There is pain with all range of motion. Assessment/Plan: 63 year old female with nondisplaced femoral neck fracture right side.//Had a long discussion with the patient today about treatment options but I feel that the most appropriate course of action will be percutaneous hip pinning.  She will have to maintain some nonweightbearing but I think this gives her the best opportunity for excellent long-term result.I have had a prolonged discussion with the patient regarding the risk and benefits of the surgical procedure.  The patient understands the risks include but are not limited to bleeding, infection and failure of the surgery to cure the problem and need for further surgery.  The patient understands there is a slight risk of death at the time of surgery.  The patient understands these risks along with the potential benefits and wishes to proceed with surgical intervention.    The patient will be followed in the office in the postoperative period.  Alta Corning 11/14/2013, 8:11 PM

## 2013-11-14 NOTE — Progress Notes (Signed)
CSW met with patient and friend at bedside.  Patient is alert, oriented x4, and cooperative.  Patient reports that she is not willing to go to any rehab facilities therefore declined resources that were offered.  Patient's friend on the other hand requested the resource list for review.  Patient and friend reports she lives with her daughter and she would possibly need someone to assist her while her daughter is at work.  The patient reports that she work with [facility] and is aware of the facilities and services.       Chesley Noon, MSW, [place], 11/14/2013 Evening Clinical Social Worker [phone]

## 2013-11-14 NOTE — Brief Op Note (Signed)
11/14/2013  10:45 PM  PATIENT:  [name] [name]  63 y.o. female  PRE-OPERATIVE DIAGNOSIS:  right hip fracture  POST-OPERATIVE DIAGNOSIS:  right hip fracture  PROCEDURE:  Procedure(s) with comments: RIGHT CANNULATED HIP PINNING (Right) - BIOMET  SURGEON:  Surgeon(s) and Role:    * Alta Corning, MD - Primary  PHYSICIAN ASSISTANT:   ASSISTANTS: bethune   ANESTHESIA:   general  EBL:  Total I/O In: -  Out: 800 [Urine:800]  BLOOD ADMINISTERED:none  DRAINS: none   LOCAL MEDICATIONS USED:  MARCAINE     SPECIMEN:  No Specimen  DISPOSITION OF SPECIMEN:  N/A  COUNTS:  YES  TOURNIQUET:  * No tourniquets in log *  DICTATION: .Other Dictation: Dictation Number [id number]  PLAN OF CARE: Admit to inpatient   PATIENT DISPOSITION:  PACU - hemodynamically stable.   Delay start of Pharmacological VTE agent (>24hrs) due to surgical blood loss or risk of bleeding: no

## 2013-11-14 NOTE — ED Notes (Signed)
Bed: VA91 Expected date:  Expected time:  Means of arrival:  Comments: Hold Triage 1

## 2013-11-14 NOTE — ED Notes (Signed)
6 FLoor notified re pt going to the OR prior to going to the floor

## 2013-11-14 NOTE — ED Notes (Signed)
Pt c/o R hip injury after tripping and falling this morning.  Pain score 1/10, increases w/ movement.  Pt had xray at [facility] that resulted a hip fracture.

## 2013-11-14 NOTE — ED Provider Notes (Signed)
CSN: [id number]     Arrival date & time 11/14/13  1737 History   First MD Initiated Contact with Patient 11/14/13 1832     Chief Complaint  Patient presents with  . Hip Injury     (Consider location/radiation/quality/duration/timing/severity/associated sxs/prior Treatment) The history is provided by the patient. No language interpreter was used.    [name] [name] is a(n) 63 y.o. female who presents to ED from PCP with hip fracture. Patient statest that she was walking her dog on a trail today when she  Tripped over a root landing on her R hip. SHe had immedate severe pain and was unable to ambulate. She denies hitting her head or LOC. She contacted a friend who brought her crutches. She was able to ambulate out of the woods with crutches. She saw her pcp who ordered an x ray which showed a nondisplaced transcervical right femoral neck fracture. She deneis numbness or tingling in the foot. She has a history of osteopenia. Pain is 1/10 at rest. She feels better with the hip sitting in flexion.    Past Medical History  Diagnosis Date  . Osteopenia 08/2012    T score -2.1 FRAX 19%/1.7%  . Atrophic vaginitis   . Elevated cholesterol    Past Surgical History  Procedure Laterality Date  . Finger surgery     Family History  Problem Relation Age of Onset  . Heart disease Father    History  Substance Use Topics  . Smoking status: Never Smoker   . Smokeless tobacco: Never Used  . Alcohol Use: No   OB History   Grav Para Term Preterm Abortions TAB SAB Ect Mult Living   2 2 2       2      Review of Systems  Ten systems reviewed and are negative for acute change, except as noted in the HPI.    Allergies  Cephalexin  Home Medications   Prior to Admission medications   Medication Sig Start Date End Date Taking? Authorizing Provider  aspirin 81 MG tablet Take 81 mg by mouth daily.     Yes Historical Provider, MD  Calcium Carbonate-Vitamin D (CALCIUM + D PO) Take by mouth daily.      Yes Historical Provider, MD  ibuprofen (ADVIL,MOTRIN) 200 MG tablet Take 200 mg by mouth every 6 (six) hours as needed (pain).   Yes Historical Provider, MD  simvastatin (ZOCOR) 40 MG tablet Take 40 mg by mouth daily.    Yes Historical Provider, MD   BP 115/52  Pulse 79  Temp(Src) 98.2 F (36.8 C) (Oral)  Resp 18  SpO2 100%  LMP 07/14/1993 Physical Exam Physical Exam  Nursing note and vitals reviewed. Constitutional: She is oriented to person, place, and time. She appears well-developed and well-nourished. No distress.  HENT:  Head: Normocephalic and atraumatic.  Eyes: Conjunctivae normal and EOM are normal. Pupils are equal, round, and reactive to light. No scleral icterus.  Neck: Normal range of motion.  Cardiovascular: Normal rate, regular rhythm and normal heart sounds.  Exam reveals no gallop and no friction rub.  Distal pulses intact No murmur heard. Pulmonary/Chest: Effort normal and breath sounds normal. No respiratory distress.  Abdominal: Soft. Bowel sounds are normal. She exhibits no distension and no mass. There is no tenderness. There is no guarding.  Neurological: She is alert and oriented to person, place, and time.  Musculoskeletal: R hip in flexion. TTP  Limited ROM due to pain. Skin: Skin is warm and dry.  She is not diaphoretic.    ED Course  Procedures (including critical care time) Labs Review Labs Reviewed - No data to display  Imaging Review Dg Femur Right  11/14/2013   ADDENDUM REPORT: 11/14/2013 17:05  ADDENDUM: These results were called by telephone at the time of interpretation on 11/14/2013 at 5:00 PM to Dr. Levin Erp , who verbally acknowledged these results. The findings were also discussed directly with the patient. At Dr Rolly Salter direction, the patient is to report to the emergency department.   Electronically Signed   By: Julian Hy M.D.   On: 11/14/2013 17:05   11/14/2013   CLINICAL DATA:  Fall, right hip pain  EXAM: RIGHT FEMUR - 2 VIEW   COMPARISON:  None.  FINDINGS: Nondisplaced transcervical right femoral neck fracture.  The joint spaces are preserved.  Visualized bony pelvis appears intact.  No suprapatellar knee joint effusion.  IMPRESSION: Nondisplaced transcervical right femoral neck fracture.  Electronically Signed: By: Julian Hy M.D. On: 11/14/2013 16:58   Dg Chest Port 1 View  11/14/2013   CLINICAL DATA:  HIP INJURY  EXAM: PORTABLE CHEST - 1 VIEW  COMPARISON:  DG CHEST 2 VIEW dated 02/25/2013  FINDINGS: The lungs hyperinflated. Cardiac silhouette within normal limits. No focal regions of consolidation or focal infiltrates. No acute osseous abnormalities.  IMPRESSION: COPD no acute cardiopulmonary disease.   Electronically Signed   By: Margaree Mackintosh M.D.   On: 11/14/2013 20:12     EKG Interpretation None     ECG interpretation   Date: 11/15/2013  Rate: 75  Rhythm: normal sinus rhythm  QRS Axis: normal  Intervals: normal  ST/T Wave abnormalities: normal  Conduction Disutrbances: none  Narrative Interpretation:   Old EKG Reviewed:     MDM   Final diagnoses:  Atrophic vaginitis  Hyperlipidemia  Osteopenia  Closed right hip fracture    7:12 PM BP 115/52  Pulse 79  Temp(Src) 98.2 F (36.8 C) (Oral)  Resp 18  SpO2 100%  LMP 07/14/1993 Patient with hip Fracture. I have ordered pre op labs.     7:22 PM BP 115/52  Pulse 79  Temp(Src) 98.2 F (36.8 C) (Oral)  Resp 18  SpO2 100%  LMP 07/14/1993 I have spoken with Dr. Berenice Primas who has asked that the patient remain npo at this time.  ekg and cxr unremarkable.  Patient will be admitted by Dr. Posey Pronto. The patient appears reasonably stabilized for admission considering the current resources, flow, and capabilities available in the ED at this time, and I doubt any other [facility] requiring further screening and/or treatment in the ED prior to admission.   Margarita Mail, PA-C 11/15/13 [phone]

## 2013-11-14 NOTE — Progress Notes (Signed)
  CARE MANAGEMENT ED NOTE 11/14/2013  Patient:  [name],[name]   Account Number:  [id number]  Date Initiated:  11/14/2013  Documentation initiated by:  Livia Snellen  Subjective/Objective Assessment:   Patient presents to Ed post fall injuring right hip     Subjective/Objective Assessment Detail:   Xray right hip Nondisplaced transcervical right femoral neck fracture     Action/Plan:   Patient to be admitted, ortho consulted   Action/Plan Detail:   Anticipated DC Date:       Status Recommendation to Physician:   Result of Recommendation:    Other ED Services  Consult Working [place]  Other  CM consult    Choice offered to / List presented to:            Status of service:  Completed, signed off  ED Comments:   ED Comments Detail:  EDCM spoke to patient at bedside.  Patient reports she lives at home with her daughter Seth Bake [phone]. Patient has another daughter Clarise Cruz [phone].  Patient reports she would like to go home after surgery.  Patient reports she has a walker, crutches and safety rails in her bathroom.  EDCM provided patient with a list of home health agencies in [facility]  EDCM explained with home health the patient may receive a visiting RN, PT, OT, aide and social worker if needed.  Patient currently going to surgery.  Patient thankful for information.  No further EDCM needs at this time.

## 2013-11-14 NOTE — Anesthesia Preprocedure Evaluation (Signed)
Anesthesia Evaluation  Patient identified by MRN, date of birth, ID band Patient awake    Reviewed: Allergy & Precautions, H&P , NPO status , Patient's Chart, lab work & pertinent test results  Airway Mallampati: II TM Distance: >3 FB Neck ROM: Full    Dental no notable dental hx.    Pulmonary neg pulmonary ROS,  breath sounds clear to auscultation  Pulmonary exam normal       Cardiovascular negative cardio ROS  Rhythm:Regular Rate:Normal     Neuro/Psych negative neurological ROS  negative psych ROS   GI/Hepatic negative GI ROS, Neg liver ROS,   Endo/Other  negative endocrine ROS  Renal/GU negative Renal ROS  negative genitourinary   Musculoskeletal negative musculoskeletal ROS (+)   Abdominal   Peds negative pediatric ROS (+)  Hematology negative hematology ROS (+)   Anesthesia Other Findings   Reproductive/Obstetrics negative OB ROS                           Anesthesia Physical Anesthesia Plan  ASA: I  Anesthesia Plan: General   Post-op Pain Management:    Induction:   Airway Management Planned: Oral ETT  Additional Equipment:   Intra-op Plan:   Post-operative Plan: Extubation in OR  Informed Consent:   Plan Discussed with: Surgeon  Anesthesia Plan Comments:         Anesthesia Quick Evaluation

## 2013-11-14 NOTE — ED Notes (Signed)
Pt reports she was walking her dog this am, tipped and fell.  Called her doctor and went in for an OP hip xray.  +hip fx, was sent here.  Pt reports pain 1/10 unless it's moved.  In no NAD.

## 2013-11-14 NOTE — ED Notes (Signed)
Pt is resting comfortably with family at bedside.  

## 2013-11-14 NOTE — H&P (Signed)
[facility]                                                                                    Patient Demographics  [name] [name], is a 63 y.o. female  CSN: [id number]  MRN: [id number]  DOB - [birth year]  Admit Date - 11/14/2013  Outpatient Primary MD for the patient is GREEN, Keenan Bachelor, MD   With History of -  Past Medical History  Diagnosis Date  . Osteopenia 08/2012    T score -2.1 FRAX 19%/1.7%  . Atrophic vaginitis   . Elevated cholesterol       Past Surgical History  Procedure Laterality Date  . Finger surgery      in for   Chief Complaint  Patient presents with  . Hip Injury     HPI  [name] [name]  is a 63 y.o. female, with past medical history significant for hypercholesterolemia on simvastatin and osteopenia who sustained a right hip fracture while walking her dog today. No prior chest pains headache or dizziness. She just slipped. I was called to admit for pain management and risk stratification. Patient reports that her pain is now 1/10 and she refused to take morphine.    Review of Systems    In addition to the HPI above,  No Fever-chills, No Headache, No changes with Vision or hearing, No problems swallowing food or Liquids, No Chest pain, Cough or Shortness of Breath, No Abdominal pain, No Nausea or Vommitting, Bowel movements are regular, No Blood in stool or Urine, No dysuria, No new skin rashes or bruises,  No new weakness, tingling, numbness in any extremity, No recent weight gain or loss, No polyuria, polydypsia or polyphagia, No significant Mental Stressors.  A full 10 point Review of Systems was done, except as stated above, all other Review of Systems were negative.   Social History History  Substance Use Topics  . Smoking status: Never Smoker   . Smokeless tobacco: Never Used  . Alcohol Use: No     Family History Family History  Problem Relation Age of Onset  . Heart disease Father      Prior to Admission  medications   Medication Sig Start Date End Date Taking? Authorizing Provider  aspirin 81 MG tablet Take 81 mg by mouth daily.     Yes Historical Provider, MD  Calcium Carbonate-Vitamin D (CALCIUM + D PO) Take by mouth daily.     Yes Historical Provider, MD  ibuprofen (ADVIL,MOTRIN) 200 MG tablet Take 200 mg by mouth every 6 (six) hours as needed (pain).   Yes Historical Provider, MD  simvastatin (ZOCOR) 40 MG tablet Take 40 mg by mouth daily.    Yes Historical Provider, MD    Allergies  Allergen Reactions  . Cephalexin Rash    Physical Exam  Vitals  Blood pressure 115/52, pulse 79, temperature 98.2 F (36.8 C), temperature source Oral, resp. rate 18, last menstrual period 07/14/1993, SpO2 100.00%.   1. General a very pleasant elderly white female in no acute distress at the moment  2. Normal affect and insight, Not Suicidal or Homicidal, Awake Alert, Oriented X 3.  3. No F.N  deficits, ALL C.Nerves Intact, Strength 5/5 all 4 extremities, Sensation intact all 4 extremities, Plantars down going.  4. Ears and Eyes appear Normal, Conjunctivae clear, PERRLA. Moist Oral Mucosa.  5. Supple Neck, No JVD, No cervical lymphadenopathy appriciated, No Carotid Bruits.  6. Symmetrical Chest wall movement, Good air movement bilaterally, CTAB.  7. RRR, No Gallops, Rubs or Murmurs, No Parasternal Heave.  8. Positive Bowel Sounds, Abdomen Soft, Non tender, No organomegaly appriciated,No rebound -guarding or rigidity.  9.  No Cyanosis, Normal Skin Turgor, No Skin Rash or Bruise.  10. Good muscle tone, right leg appears retracted and shorter than left.  11. No Palpable Lymph Nodes in Neck or Axillae    Data Review  CBC  Recent Labs Lab 11/14/13 1920  WBC 14.3*  HGB 13.6  HCT 40.0  PLT 268  MCV 95.5  MCH 32.5  MCHC 34.0  RDW 12.3  LYMPHSABS 1.7  MONOABS 0.8  EOSABS 0.1  BASOSABS 0.0    ------------------------------------------------------------------------------------------------------------------  Chemistries   Recent Labs Lab 11/14/13 1920  NA 136*  K 3.9  CL 95*  CO2 26  GLUCOSE 108*  BUN 23  CREATININE 0.97  CALCIUM 9.5   ------------------------------------------------------------------------------------------------------------------ CrCl is unknown because both a height and weight (above a minimum accepted value) are required for this calculation. ------------------------------------------------------------------------------------------------------------------ No results found for this basename: TSH, T4TOTAL, FREET3, T3FREE, THYROIDAB,  in the last 72 hours   Coagulation profile  Recent Labs Lab 11/14/13 1920  INR 0.97   ------------------------------------------------------------------------------------------------------------------- No results found for this basename: DDIMER,  in the last 72 hours -------------------------------------------------------------------------------------------------------------------  Cardiac Enzymes No results found for this basename: CK, CKMB, TROPONINI, MYOGLOBIN,  in the last 168 hours ------------------------------------------------------------------------------------------------------------------ No components found with this basename: POCBNP,    ---------------------------------------------------------------------------------------------------------------  Urinalysis No results found for this basename: colorurine, appearanceur, labspec, phurine, glucoseu, hgbur, bilirubinur, ketonesur, proteinur, urobilinogen, nitrite, leukocytesur    ----------------------------------------------------------------------------------------------------------------   Imaging results:   Dg Femur Right  11/14/2013   ADDENDUM REPORT: 11/14/2013 17:05  ADDENDUM: These results were called by telephone at the time of  interpretation on 11/14/2013 at 5:00 PM to Dr. Levin Erp , who verbally acknowledged these results. The findings were also discussed directly with the patient. At Dr Rolly Salter direction, the patient is to report to the emergency department.   Electronically Signed   By: Julian Hy M.D.   On: 11/14/2013 17:05   11/14/2013   CLINICAL DATA:  Fall, right hip pain  EXAM: RIGHT FEMUR - 2 VIEW  COMPARISON:  None.  FINDINGS: Nondisplaced transcervical right femoral neck fracture.  The joint spaces are preserved.  Visualized bony pelvis appears intact.  No suprapatellar knee joint effusion.  IMPRESSION: Nondisplaced transcervical right femoral neck fracture.  Electronically Signed: By: Julian Hy M.D. On: 11/14/2013 16:58   Dg Chest Port 1 View  11/14/2013   CLINICAL DATA:  HIP INJURY  EXAM: PORTABLE CHEST - 1 VIEW  COMPARISON:  DG CHEST 2 VIEW dated 02/25/2013  FINDINGS: The lungs hyperinflated. Cardiac silhouette within normal limits. No focal regions of consolidation or focal infiltrates. No acute osseous abnormalities.  IMPRESSION: COPD no acute cardiopulmonary disease.   Electronically Signed   By: Margaree Mackintosh M.D.   On: 11/14/2013 20:12    My personal review of EKG: Normal    Assessment & Plan  1. right femoral neck fracture, closed 2. Hyperlipidemia 3. History of osteopenia  Plan After reviewing her labs and EKG and examining the patient, she seems that  she is at low risk for surgery Morphine and OxyIR for pain management Consult orthopedics for surgery   DVT Prophylaxis Lovenox  AM Labs Ordered, also please review Full Orders  Family Communication: Admission, patients condition and plan of care including tests being ordered have been discussed with the patient and daughters who indicate understanding and agree with the plan and Code Status.  Code Status full  Disposition Plan: Home  Time spent in minutes : 32 minutes  Condition fair   @SIGNATURE @

## 2013-11-14 NOTE — Transfer of Care (Signed)
Immediate Anesthesia Transfer of Care Note  Patient: [name] [name]  Procedure(s) Performed: Procedure(s) with comments: RIGHT CANNULATED HIP PINNING (Right) - BIOMET  Patient Location: PACU  Anesthesia Type:General  Level of Consciousness: awake, alert  and oriented  Airway & Oxygen Therapy: Patient Spontanous Breathing and Patient connected to face mask oxygen  Post-op Assessment: Report given to PACU RN and Post -op Vital signs reviewed and stable  Post vital signs: Reviewed and stable  Complications: No apparent anesthesia complications

## 2013-11-15 DIAGNOSIS — S72009A Fracture of unspecified part of neck of unspecified femur, initial encounter for closed fracture: Secondary | ICD-10-CM

## 2013-11-15 LAB — PROTIME-INR
INR: 0.96 (ref 0.00–1.49)
Prothrombin Time: 12.6 seconds (ref 11.6–15.2)

## 2013-11-15 LAB — BASIC METABOLIC PANEL
BUN: 14 mg/dL (ref 6–23)
CHLORIDE: 104 meq/L (ref 96–112)
CO2: 26 meq/L (ref 19–32)
Calcium: 9.1 mg/dL (ref 8.4–10.5)
Creatinine, Ser: 0.71 mg/dL (ref 0.50–1.10)
GFR calc Af Amer: >90 mL/min (ref >90)
GFR calc non Af Amer: 89 mL/min — ABNORMAL LOW (ref >90)
Glucose, Bld: 181 mg/dL — ABNORMAL HIGH (ref 70–99)
POTASSIUM: 4.6 meq/L (ref 3.7–5.3)
Sodium: 141 mEq/L (ref 137–147)

## 2013-11-15 LAB — CBC
HEMATOCRIT: 37.3 % (ref 36.0–46.0)
HEMOGLOBIN: 12.4 g/dL (ref 12.0–15.0)
MCH: 31.6 pg (ref 26.0–34.0)
MCHC: 33.2 g/dL (ref 30.0–36.0)
MCV: 95.2 fL (ref 78.0–100.0)
Platelets: 270 10*3/uL (ref 150–400)
RBC: 3.92 MIL/uL (ref 3.87–5.11)
RDW: 12.5 % (ref 11.5–15.5)
WBC: 10.6 10*3/uL — ABNORMAL HIGH (ref 4.0–10.5)

## 2013-11-15 NOTE — Discharge Summary (Signed)
Physician Discharge Summary  [name] [name] ZOX:[id number] DOB: 1951/05/05 DOA: 11/14/2013  PCP: Criselda Peaches, MD  Admit date: 11/14/2013 Discharge date: 11/15/2013  Recommendations for Outpatient Follow-up:  1. Pt will need to follow up with PCP in 2-3 weeks post discharge 2. Please obtain BMP to evaluate electrolytes and kidney function 3. Please also check CBC to evaluate Hg and Hct levels  Discharge Diagnoses: Right hip fracture  Active Problems:   Closed right hip fracture   Hyperlipidemia   Hip fracture, right  Discharge Condition: Stable  Diet recommendation: Heart healthy diet discussed in details   History of present illness:  63 y.o. female, with past medical history significant for hypercholesterolemia on simvastatin and osteopenia who sustained a right hip fracture while walking her dog several hours prior to this admission. No prior chest pains, headache, or dizziness. No similar events in the past.   Hospital Course:  Active Problems:   Closed right hip fracture - status post cannulated screw fixation of right femoral neck fracture, post op day #1 - pt is clinically stable and doing well this AM, she wants to go home - ortho cleared for d/c, home health PT requested     Hyperlipidemia - continue statin    Procedures/Studies: Dg Hip Operative Right   11/15/2013  No adverse features after right femoral neck fracture ORIF. Nondisplaced transcervical right femoral neck fracture.   Dg Chest Port 1 View   11/14/2013  COPD no acute cardiopulmonary disease.     Consultations:  None  Antibiotics:  None  Discharge Exam: Filed Vitals:   11/15/13 1027  BP: 96/56  Pulse: 73  Temp: 98 F (36.7 C)  Resp: 16   Filed Vitals:   11/15/13 0130 11/15/13 0230 11/15/13 0516 11/15/13 1027  BP: 120/72 119/69 116/70 96/56  Pulse: 60 59 67 73  Temp: 97.9 F (36.6 C) 98 F (36.7 C) 97.9 F (36.6 C) 98 F (36.7 C)  TempSrc: Oral Oral Axillary   Resp: 16 16 16 16   Height:       Weight:      SpO2: 100% 100% 100% 98%    General: Pt is alert, follows commands appropriately, not in acute distress Cardiovascular: Regular rate and rhythm, S1/S2 +, no murmurs, no rubs, no gallops Respiratory: Clear to auscultation bilaterally, no wheezing, no crackles, no rhonchi Abdominal: Soft, non tender, non distended, bowel sounds +, no guarding Extremities: no edema, no cyanosis, pulses palpable bilaterally DP and PT Neuro: Grossly nonfocal  Discharge Instructions  Discharge Orders   Future Orders Complete By Expires   Diet - low sodium heart healthy  As directed    Increase activity slowly  As directed    Touch down weight bearing  As directed    Questions:     Laterality:  right   Extremity:  Lower       Medication List    STOP taking these medications       aspirin 81 MG tablet  Replaced by:  aspirin EC 325 MG tablet     ibuprofen 200 MG tablet  Commonly known as:  ADVIL,MOTRIN      TAKE these medications       aspirin EC 325 MG tablet  Take 1 tablet (325 mg total) by mouth 2 (two) times daily after a meal. Take x 1 month post op to decrease risk of blood clots.     CALCIUM + D PO  Take by mouth daily.     oxyCODONE-acetaminophen 5-325  MG per tablet  Commonly known as:  PERCOCET/ROXICET  Take 1-2 tablets by mouth every 6 (six) hours as needed for severe pain.     simvastatin 40 MG tablet  Commonly known as:  ZOCOR  Take 40 mg by mouth daily.           Follow-up Information   Follow up with GRAVES,JOHN L, MD. Schedule an appointment as soon as possible for a visit in 2 weeks.   Specialty:  Orthopedic Surgery   Contact information:   [place] [address] [phone]       Schedule an appointment as soon as possible for a visit with GREEN, Keenan Bachelor, MD.   Specialty:  Internal Medicine   Contact information:   [address] [place] [address] [place] [address] [phone]       Follow up with Faye Ramsay,  MD. (As needed, If symptoms worsen, call my cell phone [phone])    Specialty:  Internal Medicine   Contact information:   [address] [place] [address] [phone]        The results of significant diagnostics from this hospitalization (including imaging, microbiology, ancillary and laboratory) are listed below for reference.     Microbiology: No results found for this or any previous visit (from the past 240 hour(s)).   Labs: Basic Metabolic Panel:  Recent Labs Lab 11/14/13 1920 11/15/13 0506  NA 136* 141  K 3.9 4.6  CL 95* 104  CO2 26 26  GLUCOSE 108* 181*  BUN 23 14  CREATININE 0.97 0.71  CALCIUM 9.5 9.1   CBC:  Recent Labs Lab 11/14/13 1920 11/15/13 0506  WBC 14.3* 10.6*  NEUTROABS 11.6*  --   HGB 13.6 12.4  HCT 40.0 37.3  MCV 95.5 95.2  PLT 268 270   SIGNED: Time coordinating discharge: Over 30 minutes  Theodis Blaze, MD  [facility] 11/15/2013, 11:22 AM Pager [phone]  If 7PM-7AM, please contact night-coverage [email or web address] Password [email or web address]

## 2013-11-15 NOTE — Evaluation (Addendum)
Physical Therapy Evaluation Patient Details Name: [name] [name] MRN: [id number] DOB: May 28, 1951 Today's Date: 11/15/2013   History of Present Illness  63 yo female s/p R hip pinning 5/4. Hx of osteopenia  Clinical Impression  On eval, pt required Min-Min guard assist for mobility-able to ambulate ~115 feet with 2 crutches, ~30 feet with walker. Pt performed well with both assistive devices. Pt prefers crutches (instructed her to use walker if/when needed). Practiced ambulation, exercises, and stair negotiation. All education completed. Recommend HHPT at discharge. Ready to d/c from PT standpoint.     Follow Up Recommendations Home health PT;Supervision/Assistance - 24 hour (initially)    Equipment Recommendations  None recommended by PT (pt states she has crutches and RW already)    Recommendations for Other Services OT consult     Precautions / Restrictions Precautions Precautions: Fall Restrictions Weight Bearing Restrictions: Yes RLE Weight Bearing: Touchdown weight bearing      Mobility  Bed Mobility Overal bed mobility: Needs Assistance Bed Mobility: Supine to Sit     Supine to sit: Min assist     General bed mobility comments: Assist for R LE initially. Pt able to use L LE to hook behind R to lower to floor  Transfers Overall transfer level: Needs assistance Equipment used: Rolling walker (2 wheeled) Transfers: Sit to/from Stand Sit to Stand: Min guard         General transfer comment: VCs safety, technique, handplacement.   Ambulation/Gait Ambulation/Gait assistance: Min guard Ambulation Distance (Feet): 115 Feet Assistive device: Rolling walker (2 wheeled);Crutches Gait Pattern/deviations: Step-to pattern;Antalgic;Decreased stride length     General Gait Details: Ambulated in room to and from bathroom with walker. Progressed to ambulation with 2 crutches. VCs safety, pacing, control with crutch use.   Stairs Stairs: Yes Stairs assistance: Min  guard Stair Management: Step to pattern;Forwards;With crutches Number of Stairs: 2 General stair comments: Vcs safety, technique, sequence, R LE positioning, adherence to TDWB status. Pt maintainted WBing well-alternated b/t NWB and TDWB. close guard for safety.   Wheelchair Mobility    Modified Rankin (Stroke Patients Only)       Balance                                             Pertinent Vitals/Pain R LE 3/10 with activity. Ice applied end of session    Home Living Family/patient expects to be discharged to:: Private residence Living Arrangements: Children Available Help at Discharge: Family Type of Home: House Home Access: Stairs to enter Entrance Stairs-Rails: Right;Left;Can reach both Technical brewer of Steps: 2 Home Layout: One level Home Equipment: Environmental consultant - 2 wheels;Crutches      Prior Function Level of Independence: Independent               Hand Dominance        Extremity/Trunk Assessment   Upper Extremity Assessment: Overall WFL for tasks assessed           Lower Extremity Assessment: RLE deficits/detail RLE Deficits / Details: hip abd/add 2/5, hip flex 2/5, moves ankle well    Cervical / Trunk Assessment: Normal  Communication   Communication: No difficulties  Cognition Arousal/Alertness: Awake/alert Behavior During Therapy: WFL for tasks assessed/performed Overall Cognitive Status: Within Functional Limits for tasks assessed  General Comments      Exercises General Exercises - Lower Extremity Ankle Circles/Pumps: AROM;Both;10 reps;Seated Quad Sets: AROM;Both;10 reps;Seated Heel Slides: AAROM;Right;10 reps;Seated Hip ABduction/ADduction: AAROM;Right;10 reps;Seated      Assessment/Plan    PT Assessment Patient needs continued PT services  PT Diagnosis Difficulty walking;Abnormality of gait;Acute pain   PT Problem List Decreased strength;Decreased range of  motion;Decreased mobility;Pain;Decreased knowledge of use of DME;Decreased knowledge of precautions;Decreased balance  PT Treatment Interventions DME instruction;Gait training;Stair training;Functional mobility training;Therapeutic exercise;Therapeutic activities;Patient/family education;Balance training   PT Goals (Current goals can be found in the Care Plan section) Acute Rehab PT Goals Patient Stated Goal: regain independence and return to PLOF PT Goal Formulation: With patient/family Time For Goal Achievement: 11/22/13 Potential to Achieve Goals: Good    Frequency Min 5X/week   Barriers to discharge        Co-evaluation               End of Session Equipment Utilized During Treatment: Gait belt Activity Tolerance: Patient tolerated treatment well Patient left: in chair;with call bell/phone within reach;with family/visitor present           Time: 0925-1004 PT Time Calculation (min): 39 min   Charges:   PT Evaluation $Initial PT Evaluation Tier I: 1 Procedure PT Treatments $Gait Training: 23-37 mins $Therapeutic Activity: 8-22 mins   PT G Codes:          Weston Anna, MPT Pager: [phone]

## 2013-11-15 NOTE — Progress Notes (Signed)
Utilization review completed.  

## 2013-11-15 NOTE — Evaluation (Signed)
Occupational Therapy Evaluation Patient Details Name: [name] [name] MRN: [id number] DOB: 1951-06-04 Today's Date: 11/15/2013    History of Present Illness 63 yo female s/p R hip pinning 5/4. Hx of osteopenia   Clinical Impression   Pt is doing well and has assist at d/c by daughters. Practiced up to [place] and educated on AE use and DME.     Follow Up Recommendations  No OT follow up;Supervision/Assistance - 24 hour    Equipment Recommendations  None recommended by OT    Recommendations for Other Services       Precautions / Restrictions Precautions Precautions: Fall Restrictions Weight Bearing Restrictions: Yes RLE Weight Bearing: Touchdown weight bearing      Mobility Bed Mobility             Transfers Overall transfer level: Needs assistance Equipment used: Crutches Transfers: Sit to/from Stand Sit to Stand: Min guard         General transfer comment: VCs safety, technique, handplacement.     Balance                                            ADL Overall ADL's : Needs assistance/impaired Eating/Feeding: Independent;Sitting   Grooming: Wash/dry hands;Min guard;Standing Grooming Details (indicate cue type and reason): did well with TWB Upper Body Bathing: Set up;Sitting   Lower Body Bathing: Minimal assistance;Sit to/from stand   Upper Body Dressing : Set up;Sitting   Lower Body Dressing: Moderate assistance;Sit to/from stand   Toilet Transfer: Minimal assistance;Ambulation;BSC;Min Psychiatric nurse Details (indicate cue type and reason): with crutches Toileting- Clothing Manipulation and Hygiene: Minimal assistance;Sit to/from stand         General ADL Comments: pt states she has a fully walk in shower. Daugthers are there it assist at d/c. Educated on AE options for LB dressing /bathing but pt and family state they will assist with this. Pt states she may get a reacher to help with pickign up things. Discussed use of a BSC  at night for safety and pt states she may have a 3in1 or may can borrow one. Educated on where to obtain one if not. Pt has high toilet and grab bars in bathroom. Discussed use of 3in1 as a shower seat or obtanining a shower chair. THey willl look into shower chair options on their own but are aware of OT recommendation for a seat as she is TWB R LE. Pt doing well with min cues for safe use of crutches.      Vision                     Perception     Praxis      Pertinent Vitals/Pain 2/10 R hip; reposition, ice     Hand Dominance     Extremity/Trunk Assessment Upper Extremity Assessment Upper Extremity Assessment: Overall WFL for tasks assessed   Lower Extremity Assessment Lower Extremity Assessment: RLE deficits/detail RLE Deficits / Details: hip abd/add 2/5, hip flex 2/5, moves ankle well   Cervical / Trunk Assessment Cervical / Trunk Assessment: Normal   Communication Communication Communication: No difficulties   Cognition Arousal/Alertness: Awake/alert Behavior During Therapy: WFL for tasks assessed/performed Overall Cognitive Status: Within Functional Limits for tasks assessed                     General Comments  Exercises       Shoulder Instructions      Home Living Family/patient expects to be discharged to:: Private residence Living Arrangements: Children Available Help at Discharge: Family Type of Home: House Home Access: Stairs to enter Technical brewer of Steps: 2 Entrance Stairs-Rails: Right;Left;Can reach both Home Layout: One level     Bathroom Shower/Tub: Walk-in shower (no ledge)   Bathroom Toilet: Handicapped height     Home Equipment: Environmental consultant - 2 wheels;Crutches;Grab bars - toilet;Grab bars - tub/shower   Additional Comments: pt states she may be able to borrow a 3in1 for night use.      Prior Functioning/Environment Level of Independence: Independent             OT Diagnosis: Generalized weakness    OT Problem List: Decreased strength;Decreased knowledge of use of DME or AE   OT Treatment/Interventions: Self-care/ADL training;Patient/family education;Therapeutic activities;DME and/or AE instruction    OT Goals(Current goals can be found in the care plan section) Acute Rehab OT Goals Patient Stated Goal: regain independence and return to PLOF OT Goal Formulation: With patient Time For Goal Achievement: 11/22/13 Potential to Achieve Goals: Good  OT Frequency: Min 2X/week   Barriers to D/C:            Co-evaluation              End of Session Equipment Utilized During Treatment: Gait belt;Other (comment) (crutches)  Activity Tolerance: Patient tolerated treatment well Patient left: in chair;with call bell/phone within reach;with family/visitor present   Time: 7048-8891 OT Time Calculation (min): 38 min Charges:  OT General Charges $OT Visit: 1 Procedure OT Evaluation $Initial OT Evaluation Tier I: 1 Procedure OT Treatments $Self Care/Home Management : 8-22 mins $Therapeutic Activity: 8-22 mins G-Codes:    Alycia Patten Damira Kem [phone] 11/15/2013, 11:22 AM

## 2013-11-15 NOTE — Progress Notes (Signed)
Subjective: 1 Day Post-Op Procedure(s) (LRB): RIGHT CANNULATED HIP PINNING (Right) Patient reports pain as mild.    Objective: Vital signs in last 24 hours: Temp:  [97.7 F (36.5 C)-98.7 F (37.1 C)] 97.9 F (36.6 C) (05/05 0516) Pulse Rate:  [56-87] 67 (05/05 0516) Resp:  [12-20] 16 (05/05 0516) BP: (111-125)/(52-72) 116/70 mmHg (05/05 0516) SpO2:  [100 %] 100 % (05/05 0516) Weight:  [139 lb (63.05 kg)] 139 lb (63.05 kg) (05/04 2222)  Intake/Output from previous day: 05/04 0701 - 05/05 0700 In: 1000 [I.V.:1000] Out: 1350 [Urine:1250; Blood:100] Intake/Output this shift:     Recent Labs  11/14/13 1920 11/15/13 0506  HGB 13.6 12.4    Recent Labs  11/14/13 1920 11/15/13 0506  WBC 14.3* 10.6*  RBC 4.19 3.92  HCT 40.0 37.3  PLT 268 270    Recent Labs  11/14/13 1920 11/15/13 0506  NA 136* 141  K 3.9 4.6  CL 95* 104  CO2 26 26  BUN 23 14  CREATININE 0.97 0.71  GLUCOSE 108* 181*  CALCIUM 9.5 9.1    Recent Labs  11/14/13 1920 11/15/13 0506  INR 0.97 0.96    Neurologically intact ABD soft Neurovascular intact Sensation intact distally Intact pulses distally Dorsiflexion/Plantar flexion intact No cellulitis present Compartment soft  Assessment/Plan: 1 Day Post-Op Procedure(s) (LRB): RIGHT CANNULATED HIP PINNING (Right) Advance diet Up with therapy Discharge home with home health either today or tomorrow Asaparin 325 bid for prophylaxis ttwb currently May d/c home today if clearing pt at ttwb  Alta Corning 11/15/2013, 7:58 AM

## 2013-11-15 NOTE — Op Note (Signed)
NAMEMILISSA, FESPERMAN                  ACCOUNT NO.:  [id number]  MEDICAL RECORD NO.:  [id number]  LOCATION:  6503                         FACILITY:  [facility]  PHYSICIAN:  Alta Corning, M.D.   DATE OF BIRTH:  Aug 24, 1950  DATE OF PROCEDURE:  11/14/2013 DATE OF DISCHARGE:  11/15/2013                              OPERATIVE REPORT   PREOPERATIVE DIAGNOSIS:  Nondisplaced femoral neck fracture, right.  POSTOPERATIVE DIAGNOSIS:  Nondisplaced femoral neck fracture, right.  PROCEDURE: 1. Cannulated screw fixation of right femoral neck fracture. 2. Interpretation of multiple intraoperative fluoroscopic images.  SURGEON:  Alta Corning, M.D.  ASSISTANT:  Gary Fleet, PA  ANESTHESIA:  General.  BRIEF HISTORY:  Ms. Jha is a 63 year old female with a history of having had a fall onto her right hip.  She suffered a femoral neck fracture.  She was trying to move around on it some, got some crutches, was sitting, standing, lying, riding in a car, all those things having been done.  She ultimately had x-rays, which showed a nondisplaced femoral neck fracture.  She was seen in the emergency room and we were consulted.  I had a long discussion with her about treatment options, but even understanding of some slight risk of nonhealing, we felt internal fixation was the most appropriate course of action.  She was brought to the operating room for this procedure.  PROCEDURE:  The patient was brought to the operating room.  After adequate anesthesia was obtained with general anesthetic, the patient was placed supine on the operating table.  The right leg was then prepped and draped in usual sterile fashion after she had been moved onto the fracture table.  Care being taken to move her with a roller and not to frog the leg when the Foley was placed.  Once she was moved onto the fracture table, she was carefully placed in a boot with no traction and the opposite side was held up in an upward position  and once this was done, she was prepped and draped as outlined and we used some measuring devices to get a sense of where her femur was and the right alignment for her pins and once this was done, pin was moved into the posterior position, we went superior and inferior and then we went anterior to the inferior position.  This gave 3 beautiful screws, fixation was great, really compressed on the fracture site nicely for an anatomic reduction.  Irrigated the small 1 inch incision and closed it with some Vicryl.  She was taken to the recovery room and she was noted to be in a satisfactory condition.  Estimated blood loss for the procedure was less than 50 mL.     Alta Corning, M.D.     Corliss Skains  D:  11/14/2013  T:  11/15/2013  Job:  [id number]

## 2013-11-15 NOTE — Discharge Instructions (Signed)
Hip Fracture (Upper Femoral Fracture) °You have a hip fracture (break in bone). This is a fracture of the upper part of the big bone (femur, thigh bone) between your hip and knee. If your caregiver feels it is a stable fracture, occasionally it can be treated without surgery. Usually these fractures are unstable. This means that the bones will not heal properly without surgery. Surgery is necessary to hold the bones together in a good position where they will heal well. °DIAGNOSIS °A physical exam can determine if a fracture has occurred. X-ray studies are needed to see what type of fracture is present and to look for other injuries. These studies will help your caregiver determine what the best treatment is for you. If there is more than one option, your caregiver can give you the information needed to help you decide on the treatment. °TREATMENT  °The treatment for an unstable fracture is usually surgery. This means using a screw, nail, or rod to hold the bones in place.  °RISKS AND COMPLICATIONS °All surgery is associated with risks. Sometimes the implant may fail. Other complications of surgery include infection or the bones not healing properly. Sometimes the fracture may damage the blood supply to the head of the femur. That portion of bone may die (osteonecrosis or avascular necrosis). Sometimes to avoid this complication, an implant is used which just replaces the ball of the femur (hemi-arthroplasty or prosthetic replacement). Some of the other risks are: °· Excessive bleeding. °· Infection. °· Dislocation if a hemi-arthroplasty or a total hip was inserted. °· Failure to heal properly resulting in an unstable hip. °· Stiffness of hip following repair. °· On occasion, blood may have to be replaced before or during the procedure °LET YOUR CAREGIVERS KNOW ABOUT: °· Allergies. °· Medications taken including herbs, eye drops, over the counter medications, and creams. °· Use of steroids (by mouth or  creams). °· History of bleeding or blood problems. °· History of serious infection. °· Previous problems with anesthetics or novocaine. °· Possibility of pregnancy, if this applies. °· History of blood clots (thrombophlebitis). °· Previous surgery. °· Other health problems. °BEFORE THE PROCEDURE °Before surgery, an IV (intravenous line connected to your vein) may be started. You will be given an anesthetic (medications and gas to make you sleep) or given medications in your back to make you numb from the waist down (spinal anesthetic). °AFTER THE PROCEDURE °After surgery, you will be taken to the recovery area where a nurse will watch your progress. You may have a catheter (a long, narrow, hollow tube) in your bladder that helps you pass your water. Once you're awake, stable, and taking fluids well, you will be returned to your room. You will receive physical therapy and other care until you are doing well and your caregiver feels it is safe for you to be transferred either to home or to an extended care facility. Your activity level will change as your caregiver determines what is best for you. °· You may resume normal diet and activities as directed or allowed. °· Change dressings if necessary or as directed. °· Only take over-the-counter or prescription medicines for pain, discomfort, or fever as directed by your caregiver. °· You may be placed on blood thinners for 4-6 weeks to prevent blood clots. °SEEK IMMEDIATE MEDICAL CARE IF: °· There is swelling of your calf or leg. °· You have shortness of breath or chest pain. °· There is redness, swelling, or increasing pain in the wound. °· There is   pus coming from wound. °· You have an unexplained oral temperature above 102° F (38.9° C). °· There is a foul (bad) smell coming from the wound or dressing. °· There is a breaking open of the wound (edges not staying together) after sutures or staples have been removed. °· There is a marked increase in pain or shortening of  the leg. °· You have severe pain anywhere in the leg. °· There is any change in color or temperature of your leg below the injury. °MAKE SURE YOU:  °· Understand these instructions. °· Will watch your condition. °· Will get help right away if you are not doing well or get worse. °Document Released: 06/30/2005 Document Revised: 09/22/2011 Document Reviewed: 02/09/2013 °ExitCare® Patient Information ©2014 [employer]. ° °

## 2013-11-15 NOTE — Care Management Note (Signed)
    Page 1 of 1   11/15/2013     12:27:24 PM CARE MANAGEMENT NOTE 11/15/2013  Patient:  Clune,Saleah   Account Number:  [id number]  Date Initiated:  11/15/2013  Documentation initiated by:  [facility]  Subjective/Objective Assessment:   adm: Hip injury/R hip fx; Rcannulated hip pinning     Action/Plan:   discharge planning   Anticipated DC Date:  11/15/2013   Anticipated DC Plan:  [place]  CM consult      [facility] Choice  HOME HEALTH   Choice offered to / List presented to:  C-1 Patient        [place] arranged  HH-2 PT      [place].   Status of service:  Completed, signed off Medicare Important Message given?   (If response is "NO", the following Medicare IM given date fields will be blank) Date Medicare IM given:   Date Additional Medicare IM given:    Discharge Disposition:  [place]  Per UR Regulation:    If discussed at Long Length of Stay Meetings, dates discussed:    Comments:  11/15/13 08:00 CM spoke with pt concerning choice and gave her a list of [place] agencies in anticipation of need for HHPT (waiting for PT eval/order).  Pt does not need any DME as she states she has crutches, a walker at home, a raised toilet seat and bars in her bathroom for support. Address and contact numbers were verified with pt.  Pt chooses [facility] for HHPT.  [id number] complete and order placed.No other CM needs were communicated.   Mariane Masters, BSN, CM [phone].

## 2013-11-16 NOTE — ED Provider Notes (Signed)
Medical screening examination/treatment/procedure(s) were performed by non-physician practitioner and as supervising physician I was immediately available for consultation/collaboration.   EKG Interpretation None       Merryl Hacker, MD 11/16/13 [phone]

## 2013-11-17 ENCOUNTER — Encounter (HOSPITAL_COMMUNITY): Payer: Self-pay | Admitting: Orthopedic Surgery

## 2013-12-21 ENCOUNTER — Encounter (HOSPITAL_COMMUNITY): Payer: Self-pay | Admitting: Orthopedic Surgery

## 2013-12-21 NOTE — OR Nursing (Signed)
Procedure time added

## 2014-01-08 ENCOUNTER — Telehealth: Payer: Self-pay | Admitting: Gynecology

## 2014-01-08 MED ORDER — SULFAMETHOXAZOLE-TRIMETHOPRIM 800-160 MG PO TABS: 1.0000 | ORAL_TABLET | Freq: Two times a day (BID) | ORAL | Status: DC

## 2014-01-08 NOTE — Telephone Encounter (Signed)
On call note:  Awoke with suprapubic pressure and thinks is getting UTI.  No fever, chills, low back pain.  Slight dysuria, no frequency.  Hx a same before.  Septra DS bid x 3

## 2014-02-18 ENCOUNTER — Other Ambulatory Visit: Payer: Self-pay | Admitting: Gynecology

## 2014-03-20 ENCOUNTER — Ambulatory Visit (INDEPENDENT_AMBULATORY_CARE_PROVIDER_SITE_OTHER): Payer: No Typology Code available for payment source | Admitting: Family Medicine

## 2014-03-20 VITALS — BP 98/68 | HR 66 | Temp 98.1°F | Resp 16 | Ht 66.75 in | Wt 135.8 lb

## 2014-03-20 DIAGNOSIS — L255 Unspecified contact dermatitis due to plants, except food: Secondary | ICD-10-CM

## 2014-03-20 DIAGNOSIS — M810 Age-related osteoporosis without current pathological fracture: Secondary | ICD-10-CM

## 2014-03-20 DIAGNOSIS — L237 Allergic contact dermatitis due to plants, except food: Secondary | ICD-10-CM

## 2014-03-20 NOTE — Patient Instructions (Signed)
Continue to use your caladryl, and benadryl at night.  I am sorry you have such bad poison ivy- if you change your mind about steroids give me a call.

## 2014-03-20 NOTE — Progress Notes (Signed)
[facility] [address] [place] [address] [phone]  Date:  03/20/2014   Name:  [name] [name]   DOB:  07-16-1950   MRN:  [id number]  PCP:  Criselda Peaches, MD    Chief Complaint: Poison Ivy   History of Present Illness:  [name] [name] is a 63 y.o. very pleasant female patient who presents with the following:  Here today with PI.  She does have osteopenia and had a hip fracture with surgical repair in May of this year.  She tripped and fell, and broke her hip.  Per her surgeon "they think I have osteoporosis because of this fracture."  However she is otherwise healthy  She was exposed to [place] about 5 days ago while doing some outdoor work.  She has a pretty severe case all over her body.  She is using caladryl and benadryl po as needed and is doing ok, but was not sure if steroids might be a good idea.  No fever or other systemic sx   Patient Active Problem List   Diagnosis Date Noted  . Closed right hip fracture 11/14/2013  . Hyperlipidemia 11/14/2013  . Hip fracture, right 11/14/2013  . Osteopenia   . Atrophic vaginitis     Past Medical History  Diagnosis Date  . Osteopenia 08/2012    T score -2.1 FRAX 19%/1.7%  . Atrophic vaginitis   . Elevated cholesterol     Past Surgical History  Procedure Laterality Date  . Finger surgery    . Hip arthroplasty Right 11/14/2013    Procedure: RIGHT CANNULATED HIP PINNING;  Surgeon: Alta Corning, MD;  Location: [facility];  Service: Orthopedics;  Laterality: Right;  BIOMET    History  Substance Use Topics  . Smoking status: Never Smoker   . Smokeless tobacco: Never Used  . Alcohol Use: No    Family History  Problem Relation Age of Onset  . Heart disease Father     Allergies  Allergen Reactions  . Cephalexin Rash    Medication list has been reviewed and updated.  Current Outpatient Prescriptions on File Prior to Visit  Medication Sig Dispense Refill  . Calcium Carbonate-Vitamin D (CALCIUM + D PO)  Take by mouth daily.        . simvastatin (ZOCOR) 40 MG tablet Take 40 mg by mouth daily.       Marland Kitchen aspirin EC 325 MG tablet Take 1 tablet (325 mg total) by mouth 2 (two) times daily after a meal. Take x 1 month post op to decrease risk of blood clots.  60 tablet  0  . oxyCODONE-acetaminophen (PERCOCET/ROXICET) 5-325 MG per tablet Take 1-2 tablets by mouth every 6 (six) hours as needed for severe pain.  50 tablet  0  . sulfamethoxazole-trimethoprim (SEPTRA DS) 800-160 MG per tablet Take 1 tablet by mouth 2 (two) times daily. For 3 days  6 tablet  0   No current facility-administered medications on file prior to visit.    Review of Systems:  As per HPI- otherwise negative.   Physical Examination: Filed Vitals:   03/20/14 0845  BP: 98/68  Pulse: 66  Temp: 98.1 F (36.7 C)  Resp: 16   Filed Vitals:   03/20/14 0845  Height: 5' 6.75" (1.695 m)  Weight: 135 lb 12.8 oz (61.598 kg)   Body mass index is 21.44 kg/(m^2). Ideal Body Weight: Weight in (lb) to have BMI = 25: 158.1  GEN: WDWN, NAD, Non-toxic, A &  O x 3, slim build, looks well HEENT: Atraumatic, Normocephalic. Neck supple. No masses, No LAD. Ears and Nose: No external deformity. CV: RRR, No M/G/R. No JVD. No thrill. No extra heart sounds. PULM: CTA B, no wheezes, crackles, rhonchi. No retractions. No resp. distress. No accessory muscle use. EXTR: No c/c/e NEURO Normal gait.  PSYCH: Normally interactive. Conversant. Not depressed or anxious appearing.  Calm demeanor.  She does have rhus dermatitis especially behind her knees and on her arms, also some on her chest and abdomen   Assessment and Plan: Poison ivy  Discussed with her in detail.  I am certainly glad to rx a steroid, but it may increase her risk of worsening bone density.  She feels that this is not worth the risk for her, she is getting by ok as she is and will continue to use caldryl and benadryl.  She will let me know if she has any other trouble or if she  changes her mind   Signed Lamar Blinks, MD

## 2014-04-12 ENCOUNTER — Encounter: Payer: Self-pay | Admitting: Gynecology

## 2014-04-12 ENCOUNTER — Ambulatory Visit (INDEPENDENT_AMBULATORY_CARE_PROVIDER_SITE_OTHER): Payer: No Typology Code available for payment source | Admitting: Gynecology

## 2014-04-12 ENCOUNTER — Other Ambulatory Visit (HOSPITAL_COMMUNITY)
Admission: RE | Admit: 2014-04-12 | Discharge: 2014-04-12 | Disposition: A | Discharge status: Home / Self Care | Payer: No Typology Code available for payment source | Source: Ambulatory Visit | Attending: Gynecology | Admitting: Gynecology

## 2014-04-12 VITALS — BP 110/70 | Ht 66.0 in | Wt 135.0 lb

## 2014-04-12 DIAGNOSIS — Z1151 Encounter for screening for human papillomavirus (HPV): Secondary | ICD-10-CM | POA: Diagnosis present

## 2014-04-12 DIAGNOSIS — Z01419 Encounter for gynecological examination (general) (routine) without abnormal findings: Secondary | ICD-10-CM | POA: Diagnosis present

## 2014-04-12 DIAGNOSIS — M810 Age-related osteoporosis without current pathological fracture: Secondary | ICD-10-CM | POA: Insufficient documentation

## 2014-04-12 MED ORDER — ALENDRONATE SODIUM 70 MG PO TABS: 70.0000 mg | ORAL_TABLET | ORAL | Status: DC

## 2014-04-12 NOTE — Patient Instructions (Addendum)
Start on Fosamax as we discussed. Call me if you have any issues.  Alendronate tablets What is this medicine? ALENDRONATE (a LEN droe nate) slows calcium loss from bones. It helps to make normal healthy bone and to slow bone loss in people with Paget's disease and osteoporosis. It may be used in others at risk for bone loss. This medicine may be used for other purposes; ask your health care provider or pharmacist if you have questions. COMMON BRAND NAME(S): Fosamax What should I tell my health care provider before I take this medicine? They need to know if you have any of these conditions: -dental disease -esophagus, stomach, or intestine problems, like acid reflux or GERD -kidney disease -low blood calcium -low vitamin D -problems sitting or standing 30 minutes -trouble swallowing -an unusual or allergic reaction to alendronate, other medicines, foods, dyes, or preservatives -pregnant or trying to get pregnant -breast-feeding How should I use this medicine? You must take this medicine exactly as directed or you will lower the amount of the medicine you absorb into your body or you may cause yourself harm. Take this medicine by mouth first thing in the morning, after you are up for the day. Do not eat or drink anything before you take your medicine. Swallow the tablet with a full glass (6 to 8 fluid ounces) of plain water. Do not take this medicine with any other drink. Do not chew or crush the tablet. After taking this medicine, do not eat breakfast, drink, or take any medicines or vitamins for at least 30 minutes. Sit or stand up for at least 30 minutes after you take this medicine; do not lie down. Do not take your medicine more often than directed. Talk to your pediatrician regarding the use of this medicine in children. Special care may be needed. Overdosage: If you think you have taken too much of this medicine contact a poison control center or emergency room at once. NOTE: This  medicine is only for you. Do not share this medicine with others. What if I miss a dose? If you miss a dose, do not take it later in the day. Continue your normal schedule starting the next morning. Do not take double or extra doses. What may interact with this medicine? -aluminum hydroxide -antacids -aspirin -calcium supplements -drugs for inflammation like ibuprofen, naproxen, and others -iron supplements -magnesium supplements -vitamins with minerals This list may not describe all possible interactions. Give your health care provider a list of all the medicines, herbs, non-prescription drugs, or dietary supplements you use. Also tell them if you smoke, drink alcohol, or use illegal drugs. Some items may interact with your medicine. What should I watch for while using this medicine? Visit your doctor or health care professional for regular checks ups. It may be some time before you see benefit from this medicine. Do not stop taking your medicine except on your doctor's advice. Your doctor or health care professional may order blood tests and other tests to see how you are doing. You should make sure you get enough calcium and vitamin D while you are taking this medicine, unless your doctor tells you not to. Discuss the foods you eat and the vitamins you take with your health care professional. Some people who take this medicine have severe bone, joint, and/or muscle pain. This medicine may also increase your risk for a broken thigh bone. Tell your doctor right away if you have pain in your upper leg or groin. Tell your doctor  if you have any pain that does not go away or that gets worse. This medicine can make you more sensitive to the sun. If you get a rash while taking this medicine, sunlight may cause the rash to get worse. Keep out of the sun. If you cannot avoid being in the sun, wear protective clothing and use sunscreen. Do not use sun lamps or tanning beds/booths. What side effects may I  notice from receiving this medicine? Side effects that you should report to your doctor or health care professional as soon as possible: -allergic reactions like skin rash, itching or hives, swelling of the face, lips, or tongue -black or tarry stools -bone, muscle or joint pain -changes in vision -chest pain -heartburn or stomach pain -jaw pain, especially after dental work -pain or trouble when swallowing -redness, blistering, peeling or loosening of the skin, including inside the mouth Side effects that usually do not require medical attention (report to your doctor or health care professional if they continue or are bothersome): -changes in taste -diarrhea or constipation -eye pain or itching -headache -nausea or vomiting -stomach gas or fullness This list may not describe all possible side effects. Call your doctor for medical advice about side effects. You may report side effects to FDA at 1-800-FDA-1088. Where should I keep my medicine? Keep out of the reach of children. Store at room temperature of 15 and 30 degrees C (59 and 86 degrees F). Throw away any unused medicine after the expiration date. NOTE: This sheet is a summary. It may not cover all possible information. If you have questions about this medicine, talk to your doctor, pharmacist, or health care provider.  2015, Elsevier/Gold Standard. (2010-12-27 08:56:09)  Call to Schedule your mammogram  Facilities in [place]: 1)  [facility], [address] Phone: [phone] 2)  [facility]. [address] [place] Phone: [phone] 3)  Dr. Isaiah Blakes at [facility] [address] Phone: [phone]     Mammogram A mammogram is an X-ray test to find changes in a woman's breast. You should get a mammogram if:  You are 25 years of age or older  You have risk factors.   Your doctor recommends that you have one.   BEFORE THE TEST  Do not schedule the test the week before your period, especially if your breasts are sore during this time.  On the day of your mammogram:  Wash your breasts and armpits well. After washing, do not put on any deodorant or talcum powder on until after your test.   Eat and drink as you usually do.   Take your medicines as usual.   If you are diabetic and take insulin, make sure you:   Eat before coming for your test.   Take your insulin as usual.   If you cannot keep your appointment, call before the appointment to cancel. Schedule another appointment.  TEST  You will need to undress from the waist up. You will put on a hospital gown.   Your breast will be put on the mammogram machine, and it will press firmly on your breast with a piece of plastic called a compression paddle. This will make your breast flatter so that the machine can X-ray all parts of your breast.   Both breasts will be X-rayed. Each breast will be X-rayed from above and from the side. An X-ray might need to be taken again  if the picture is not good enough.   The mammogram will last about 15 to 30 minutes.  AFTER THE TEST Finding out the results of your test Ask when your test results will be ready. Make sure you get your test results.  Document Released: 09/26/2008 Document Revised: 06/19/2011 Document Reviewed: 09/26/2008 [facility] Patient Information 2012 [place].  You may obtain a copy of any labs that were done today by logging onto MyChart as outlined in the instructions provided with your AVS (after visit summary). The office will not call with normal lab results but certainly if there are any significant abnormalities then we will contact you.   Health Maintenance, Female A healthy lifestyle and preventative care can promote health and wellness.  Maintain regular health, dental, and eye exams.  Eat a healthy diet. Foods like vegetables, fruits, whole grains, low-fat dairy  products, and lean protein foods contain the nutrients you need without too many calories. Decrease your intake of foods high in solid fats, added sugars, and salt. Get information about a proper diet from your caregiver, if necessary.  Regular physical exercise is one of the most important things you can do for your health. Most adults should get at least 150 minutes of moderate-intensity exercise (any activity that increases your heart rate and causes you to sweat) each week. In addition, most adults need muscle-strengthening exercises on 2 or more days a week.   Maintain a healthy weight. The body mass index (BMI) is a screening tool to identify possible weight problems. It provides an estimate of body fat based on height and weight. Your caregiver can help determine your BMI, and can help you achieve or maintain a healthy weight. For adults 20 years and older:  A BMI below 18.5 is considered underweight.  A BMI of 18.5 to 24.9 is normal.  A BMI of 25 to 29.9 is considered overweight.  A BMI of 30 and above is considered obese.  Maintain normal blood lipids and cholesterol by exercising and minimizing your intake of saturated fat. Eat a balanced diet with plenty of fruits and vegetables. Blood tests for lipids and cholesterol should begin at age 38 and be repeated every 5 years. If your lipid or cholesterol levels are high, you are over 50, or you are a high risk for heart disease, you may need your cholesterol levels checked more frequently.Ongoing high lipid and cholesterol levels should be treated with medicines if diet and exercise are not effective.  If you smoke, find out from your caregiver how to quit. If you do not use tobacco, do not start.  Lung cancer screening is recommended for adults aged 63 80 years who are at high risk for developing lung cancer because of a history of smoking. Yearly low-dose computed tomography (CT) is recommended for people who have at least a 30-pack-year  history of smoking and are a current smoker or have quit within the past 15 years. A pack year of smoking is smoking an average of 1 pack of cigarettes a day for 1 year (for example: 1 pack a day for 30 years or 2 packs a day for 15 years). Yearly screening should continue until the smoker has stopped smoking for at least 15 years. Yearly screening should also be stopped for people who develop a health problem that would prevent them from having lung cancer treatment.  If you are pregnant, do not drink alcohol. If you are breastfeeding, be very cautious about drinking alcohol. If you are  not pregnant and choose to drink alcohol, do not exceed 1 drink per day. One drink is considered to be 12 ounces (355 mL) of beer, 5 ounces (148 mL) of wine, or 1.5 ounces (44 mL) of liquor.  Avoid use of street drugs. Do not share needles with anyone. Ask for help if you need support or instructions about stopping the use of drugs.  High blood pressure causes heart disease and increases the risk of stroke. Blood pressure should be checked at least every 1 to 2 years. Ongoing high blood pressure should be treated with medicines, if weight loss and exercise are not effective.  If you are 27 to 65 years old, ask your caregiver if you should take aspirin to prevent strokes.  Diabetes screening involves taking a blood sample to check your fasting blood sugar level. This should be done once every 3 years, after age 91, if you are within normal weight and without risk factors for diabetes. Testing should be considered at a younger age or be carried out more frequently if you are overweight and have at least 1 risk factor for diabetes.  Breast cancer screening is essential preventative care for women. You should practice "breast self-awareness." This means understanding the normal appearance and feel of your breasts and may include breast self-examination. Any changes detected, no matter how small, should be reported to a  caregiver. Women in their 31s and 30s should have a clinical breast exam (CBE) by a caregiver as part of a regular health exam every 1 to 3 years. After age 66, women should have a CBE every year. Starting at age 40, women should consider having a mammogram (breast X-ray) every year. Women who have a family history of breast cancer should talk to their caregiver about genetic screening. Women at a high risk of breast cancer should talk to their caregiver about having an MRI and a mammogram every year.  Breast cancer gene (BRCA)-related cancer risk assessment is recommended for women who have family members with BRCA-related cancers. BRCA-related cancers include breast, ovarian, tubal, and peritoneal cancers. Having family members with these cancers may be associated with an increased risk for harmful changes (mutations) in the breast cancer genes BRCA1 and BRCA2. Results of the assessment will determine the need for genetic counseling and BRCA1 and BRCA2 testing.  The Pap test is a screening test for cervical cancer. Women should have a Pap test starting at age 38. Between ages 23 and 23, Pap tests should be repeated every 2 years. Beginning at age 29, you should have a Pap test every 3 years as long as the past 3 Pap tests have been normal. If you had a hysterectomy for a problem that was not cancer or a condition that could lead to cancer, then you no longer need Pap tests. If you are between ages 98 and 69, and you have had normal Pap tests going back 10 years, you no longer need Pap tests. If you have had past treatment for cervical cancer or a condition that could lead to cancer, you need Pap tests and screening for cancer for at least 20 years after your treatment. If Pap tests have been discontinued, risk factors (such as a new sexual partner) need to be reassessed to determine if screening should be resumed. Some women have medical problems that increase the chance of getting cervical cancer. In these  cases, your caregiver may recommend more frequent screening and Pap tests.  The human papillomavirus (HPV) test is  an additional test that may be used for cervical cancer screening. The HPV test looks for the virus that can cause the cell changes on the cervix. The cells collected during the Pap test can be tested for HPV. The HPV test could be used to screen women aged 24 years and older, and should be used in women of any age who have unclear Pap test results. After the age of 88, women should have HPV testing at the same frequency as a Pap test.  Colorectal cancer can be detected and often prevented. Most routine colorectal cancer screening begins at the age of 75 and continues through age 60. However, your caregiver may recommend screening at an earlier age if you have risk factors for colon cancer. On a yearly basis, your caregiver may provide home test kits to check for hidden blood in the stool. Use of a small camera at the end of a tube, to directly examine the colon (sigmoidoscopy or colonoscopy), can detect the earliest forms of colorectal cancer. Talk to your caregiver about this at age 63, when routine screening begins. Direct examination of the colon should be repeated every 5 to 10 years through age 52, unless early forms of pre-cancerous polyps or small growths are found.  Hepatitis C blood testing is recommended for all people born from [birth year] through 1967 and any individual with known risks for hepatitis C.  Practice safe sex. Use condoms and avoid high-risk sexual practices to reduce the spread of sexually transmitted infections (STIs). Sexually active women aged 26 and younger should be checked for Chlamydia, which is a common sexually transmitted infection. Older women with new or multiple partners should also be tested for Chlamydia. Testing for other STIs is recommended if you are sexually active and at increased risk.  Osteoporosis is a disease in which the bones lose minerals and  strength with aging. This can result in serious bone fractures. The risk of osteoporosis can be identified using a bone density scan. Women ages 43 and over and women at risk for fractures or osteoporosis should discuss screening with their caregivers. Ask your caregiver whether you should be taking a calcium supplement or vitamin D to reduce the rate of osteoporosis.  Menopause can be associated with physical symptoms and risks. Hormone replacement therapy is available to decrease symptoms and risks. You should talk to your caregiver about whether hormone replacement therapy is right for you.  Use sunscreen. Apply sunscreen liberally and repeatedly throughout the day. You should seek shade when your shadow is shorter than you. Protect yourself by wearing long sleeves, pants, a wide-brimmed hat, and sunglasses year round, whenever you are outdoors.  Notify your caregiver of new moles or changes in moles, especially if there is a change in shape or color. Also notify your caregiver if a mole is larger than the size of a pencil eraser.  Stay current with your immunizations. Document Released: 01/13/2011 Document Revised: 10/25/2012 Document Reviewed: 01/13/2011 [facility] Patient Information 2014 [place].

## 2014-04-12 NOTE — Progress Notes (Signed)
[name] [name] 22-Oct-1950 [id number]        62 y.o.  G2P2002 for annual exam.  Several issues noted below.  Past medical history,surgical history, problem list, medications, allergies, family history and social history were all reviewed and documented as reviewed in the EPIC chart.  ROS:  12 system ROS performed with pertinent positives and negatives included in the history, assessment and plan.   Additional significant findings :  none   Exam: [name] Vitals:   04/12/14 0858  BP: 110/70  Height: 5\' 6"  (1.676 m)  Weight: 135 lb (61.236 kg)   General appearance:  Normal affect, orientation and appearance. Skin: Grossly normal HEENT: Without gross lesions.  No cervical or supraclavicular adenopathy. Thyroid normal.  Lungs:  Clear without wheezing, rales or rhonchi Cardiac: RR, without RMG Abdominal:  Soft, nontender, without masses, guarding, rebound, organomegaly or hernia Breasts:  Examined lying and sitting without masses, retractions, discharge or axillary adenopathy. Pelvic:  Ext/BUS/vagina with generalized atrophic changes  Cervix atrophic. Pap/HPV  Uterus anteverted, normal size, shape and contour, midline and mobile nontender   Adnexa  Without masses or tenderness    Anus and perineum  Normal   Rectovaginal  Normal sphincter tone without palpated masses or tenderness.    Assessment/Plan:  63 y.o. [id number] female for annual exam .   1. Postmenopausal/atrophic genital changes. Without significant symptoms of hot flushes, night sweats, vaginal dryness. No vaginal bleeding. Continue to monitor. Report any vaginal bleeding. 2. Osteoporosis. Patient fell in May and fractured her right hip. By definition she has osteoporosis. DEXA 2014 showed T score -2.1. Dr. Nyoka Cowden started her on Boniva but apparently is going to cost her over $150 per pill. Reviewed alternative options and ultimately we decided on alendronate 70 mg weekly. I reviewed how to take the medication, risks and  side effects. GERD, osteonecrosis of the jaw atypical fractures all discussed. Tentatively plan on a 4 year course and then a drug-free holiday following assuming she has a good response.  Plan repeat DEXA next year at two-year interval. Increased calcium vitamin D reviewed. Recently had blood work and she's going to check to make sure they did a vitamin D level. If not she'll return and have it drawn here. 3. Pap smear 2012. Pap/HPV today. No history of significant abnormal Pap smears previously. 4. Mammography 08/2011. Needs mammography now and patient agrees to follow up for this. Understands she is overdue. SBE monthly reviewed. 5. Colonoscopy 6-7 years ago with planned repeat a 10 year interval. 6. Health maintenance. No routine blood work done as she reports this done at her primary physician's office. Follow up one year, sooner as needed.     [name] MD, 9:23 AM 04/12/2014

## 2014-04-12 NOTE — Addendum Note (Signed)
Addended by: Nelva Nay on: 04/12/2014 09:45 AM   Modules accepted: Orders

## 2014-04-13 LAB — CYTOLOGY - PAP

## 2014-05-15 ENCOUNTER — Encounter: Payer: Self-pay | Admitting: Gynecology

## 2014-08-09 ENCOUNTER — Other Ambulatory Visit: Payer: Self-pay | Admitting: Dermatology

## 2015-03-14 ENCOUNTER — Other Ambulatory Visit: Payer: Self-pay | Admitting: Internal Medicine

## 2015-03-14 ENCOUNTER — Ambulatory Visit
Admission: RE | Admit: 2015-03-14 | Discharge: 2015-03-14 | Disposition: A | Discharge status: Home / Self Care | Payer: No Typology Code available for payment source | Source: Ambulatory Visit | Attending: Internal Medicine | Admitting: Internal Medicine

## 2015-03-14 DIAGNOSIS — J189 Pneumonia, unspecified organism: Secondary | ICD-10-CM

## 2015-03-15 DIAGNOSIS — J189 Pneumonia, unspecified organism: Secondary | ICD-10-CM

## 2015-03-15 HISTORY — DX: Pneumonia, unspecified organism: J18.9

## 2015-03-18 ENCOUNTER — Emergency Department (HOSPITAL_COMMUNITY): Payer: No Typology Code available for payment source

## 2015-03-18 ENCOUNTER — Inpatient Hospital Stay (HOSPITAL_COMMUNITY): Payer: No Typology Code available for payment source

## 2015-03-18 ENCOUNTER — Inpatient Hospital Stay (HOSPITAL_COMMUNITY)
Admission: EM | Admit: 2015-03-18 | Discharge: 2015-03-21 | DRG: 871 | Disposition: A | Discharge status: Home / Self Care | Payer: No Typology Code available for payment source | Attending: Internal Medicine | Admitting: Internal Medicine

## 2015-03-18 ENCOUNTER — Encounter (HOSPITAL_COMMUNITY): Payer: Self-pay

## 2015-03-18 DIAGNOSIS — E785 Hyperlipidemia, unspecified: Secondary | ICD-10-CM | POA: Diagnosis present

## 2015-03-18 DIAGNOSIS — E46 Unspecified protein-calorie malnutrition: Secondary | ICD-10-CM | POA: Diagnosis present

## 2015-03-18 DIAGNOSIS — A419 Sepsis, unspecified organism: Principal | ICD-10-CM | POA: Diagnosis present

## 2015-03-18 DIAGNOSIS — R197 Diarrhea, unspecified: Secondary | ICD-10-CM | POA: Diagnosis present

## 2015-03-18 DIAGNOSIS — I951 Orthostatic hypotension: Secondary | ICD-10-CM | POA: Diagnosis not present

## 2015-03-18 DIAGNOSIS — J189 Pneumonia, unspecified organism: Secondary | ICD-10-CM | POA: Diagnosis present

## 2015-03-18 DIAGNOSIS — M810 Age-related osteoporosis without current pathological fracture: Secondary | ICD-10-CM | POA: Diagnosis present

## 2015-03-18 DIAGNOSIS — J159 Unspecified bacterial pneumonia: Secondary | ICD-10-CM | POA: Diagnosis present

## 2015-03-18 DIAGNOSIS — Z6820 Body mass index (BMI) 20.0-20.9, adult: Secondary | ICD-10-CM | POA: Diagnosis not present

## 2015-03-18 DIAGNOSIS — Z8249 Family history of ischemic heart disease and other diseases of the circulatory system: Secondary | ICD-10-CM | POA: Diagnosis not present

## 2015-03-18 DIAGNOSIS — J449 Chronic obstructive pulmonary disease, unspecified: Secondary | ICD-10-CM | POA: Diagnosis present

## 2015-03-18 DIAGNOSIS — Z8701 Personal history of pneumonia (recurrent): Secondary | ICD-10-CM | POA: Diagnosis not present

## 2015-03-18 DIAGNOSIS — R0781 Pleurodynia: Secondary | ICD-10-CM

## 2015-03-18 DIAGNOSIS — R55 Syncope and collapse: Secondary | ICD-10-CM | POA: Diagnosis present

## 2015-03-18 DIAGNOSIS — R509 Fever, unspecified: Secondary | ICD-10-CM | POA: Diagnosis present

## 2015-03-18 HISTORY — DX: Pneumonia, unspecified organism: J18.9

## 2015-03-18 LAB — URINALYSIS, ROUTINE W REFLEX MICROSCOPIC
BILIRUBIN URINE: NEGATIVE
Glucose, UA: NEGATIVE mg/dL
Hgb urine dipstick: NEGATIVE
Ketones, ur: NEGATIVE mg/dL
Leukocytes, UA: NEGATIVE
NITRITE: NEGATIVE
Protein, ur: NEGATIVE mg/dL
SPECIFIC GRAVITY, URINE: 1.005 (ref 1.005–1.030)
UROBILINOGEN UA: 0.2 mg/dL (ref 0.0–1.0)
pH: 7.5 (ref 5.0–8.0)

## 2015-03-18 LAB — CBC WITH DIFFERENTIAL/PLATELET
Basophils Absolute: 0 10*3/uL (ref 0.0–0.1)
Basophils Relative: 0 % (ref 0–1)
EOS ABS: 0.1 10*3/uL (ref 0.0–0.7)
EOS PCT: 1 % (ref 0–5)
HCT: 38 % (ref 36.0–46.0)
Hemoglobin: 12.5 g/dL (ref 12.0–15.0)
LYMPHS PCT: 9 % — AB (ref 12–46)
Lymphs Abs: 1.5 10*3/uL (ref 0.7–4.0)
MCH: 31.7 pg (ref 26.0–34.0)
MCHC: 32.9 g/dL (ref 30.0–36.0)
MCV: 96.4 fL (ref 78.0–100.0)
Monocytes Absolute: 0.9 10*3/uL (ref 0.1–1.0)
Monocytes Relative: 5 % (ref 3–12)
NEUTROS PCT: 85 % — AB (ref 43–77)
Neutro Abs: 13.9 10*3/uL — ABNORMAL HIGH (ref 1.7–7.7)
PLATELETS: 547 10*3/uL — AB (ref 150–400)
RBC: 3.94 MIL/uL (ref 3.87–5.11)
RDW: 12.1 % (ref 11.5–15.5)
WBC: 16.4 10*3/uL — AB (ref 4.0–10.5)

## 2015-03-18 LAB — BASIC METABOLIC PANEL
Anion gap: 10 (ref 5–15)
BUN: 7 mg/dL (ref 6–20)
CALCIUM: 9.4 mg/dL (ref 8.9–10.3)
CO2: 28 mmol/L (ref 22–32)
CREATININE: 0.59 mg/dL (ref 0.44–1.00)
Chloride: 98 mmol/L — ABNORMAL LOW (ref 101–111)
Glucose, Bld: 134 mg/dL — ABNORMAL HIGH (ref 65–99)
Potassium: 3.4 mmol/L — ABNORMAL LOW (ref 3.5–5.1)
SODIUM: 136 mmol/L (ref 135–145)

## 2015-03-18 LAB — TROPONIN I

## 2015-03-18 LAB — D-DIMER, QUANTITATIVE (NOT AT ARMC): D DIMER QUANT: 0.99 ug{FEU}/mL — AB (ref 0.00–0.48)

## 2015-03-18 LAB — I-STAT CG4 LACTIC ACID, ED: Lactic Acid, Venous: 0.83 mmol/L (ref 0.5–2.0)

## 2015-03-18 MED ORDER — ENSURE ENLIVE PO LIQD
237.0000 mL | Freq: Two times a day (BID) | ORAL | Status: DC
  Administered 2015-03-19 – 2015-03-21 (×5): 237 mL via ORAL

## 2015-03-18 MED ORDER — DIPHENHYDRAMINE HCL 25 MG PO CAPS
25.0000 mg | ORAL_CAPSULE | Freq: Once | ORAL | Status: AC
  Administered 2015-03-18: 25 mg via ORAL
  Filled 2015-03-18: qty 1

## 2015-03-18 MED ORDER — PIPERACILLIN-TAZOBACTAM 3.375 G IVPB
3.3750 g | Freq: Three times a day (TID) | INTRAVENOUS | Status: DC
  Administered 2015-03-19 – 2015-03-21 (×7): 3.375 g via INTRAVENOUS
  Filled 2015-03-18 (×9): qty 50

## 2015-03-18 MED ORDER — PIPERACILLIN-TAZOBACTAM 3.375 G IVPB 30 MIN
3.3750 g | INTRAVENOUS | Status: AC
  Administered 2015-03-18: 3.375 g via INTRAVENOUS
  Filled 2015-03-18: qty 50

## 2015-03-18 MED ORDER — SODIUM CHLORIDE 0.9 % IV SOLN
INTRAVENOUS | Status: DC
  Administered 2015-03-18 – 2015-03-21 (×3): via INTRAVENOUS

## 2015-03-18 MED ORDER — ASPIRIN EC 81 MG PO TBEC
81.0000 mg | DELAYED_RELEASE_TABLET | Freq: Every day | ORAL | Status: DC
  Administered 2015-03-19 – 2015-03-21 (×3): 81 mg via ORAL
  Filled 2015-03-18 (×4): qty 1

## 2015-03-18 MED ORDER — ACETAMINOPHEN 325 MG PO TABS
650.0000 mg | ORAL_TABLET | Freq: Once | ORAL | Status: AC
  Administered 2015-03-18: 650 mg via ORAL
  Filled 2015-03-18: qty 2

## 2015-03-18 MED ORDER — SIMVASTATIN 40 MG PO TABS
40.0000 mg | ORAL_TABLET | Freq: Every day | ORAL | Status: DC
  Administered 2015-03-19 – 2015-03-21 (×3): 40 mg via ORAL
  Filled 2015-03-18 (×4): qty 1

## 2015-03-18 MED ORDER — IOHEXOL 350 MG/ML SOLN
100.0000 mL | Freq: Once | INTRAVENOUS | Status: AC | PRN
  Administered 2015-03-18: 100 mL via INTRAVENOUS

## 2015-03-18 MED ORDER — VANCOMYCIN HCL IN DEXTROSE 750-5 MG/150ML-% IV SOLN
750.0000 mg | Freq: Two times a day (BID) | INTRAVENOUS | Status: DC
  Administered 2015-03-19 – 2015-03-20 (×4): 750 mg via INTRAVENOUS
  Filled 2015-03-18 (×5): qty 150

## 2015-03-18 MED ORDER — ENOXAPARIN SODIUM 40 MG/0.4ML ~~LOC~~ SOLN
40.0000 mg | Freq: Every day | SUBCUTANEOUS | Status: DC
  Administered 2015-03-18 – 2015-03-19 (×2): 40 mg via SUBCUTANEOUS
  Filled 2015-03-18 (×3): qty 0.4

## 2015-03-18 MED ORDER — VANCOMYCIN HCL IN DEXTROSE 1-5 GM/200ML-% IV SOLN
1000.0000 mg | Freq: Once | INTRAVENOUS | Status: AC
  Administered 2015-03-18: 1000 mg via INTRAVENOUS
  Filled 2015-03-18: qty 200

## 2015-03-18 NOTE — ED Notes (Addendum)
Pt asked if her family member could bring her a "big hamburger", as she was starving.  I advised her to wait until we got some of the test results back and she speaks with her doctor.

## 2015-03-18 NOTE — ED Notes (Signed)
She states she was recently dx and treated for a right-sided pneumonia by her pcp Dr. Zada Girt.  She states she has completed her Z-pack a few days ago, but has noted continuation of her back pan, anorexia and waxing and waning fever.  She is in no distress.

## 2015-03-18 NOTE — Progress Notes (Signed)
ANTIBIOTIC CONSULT NOTE - INITIAL  Pharmacy Consult for Vancomycin, Zosyn Indication: pneumonia  Allergies  Allergen Reactions  . Cephalexin Rash    Patient Measurements:     Vital Signs: Temp: 100.1 F (37.8 C) (09/04 1814) Temp Source: Oral (09/04 1814) BP: 132/58 mmHg (09/04 1814) Pulse Rate: 99 (09/04 1814) Intake/Output from previous day:   Intake/Output from this shift:    Labs:  Recent Labs  03/18/15 1659  WBC 16.4*  HGB 12.5  PLT 547*  CREATININE 0.59   CrCl cannot be calculated (Unknown ideal weight.). No results for input(s): VANCOTROUGH, VANCOPEAK, VANCORANDOM, GENTTROUGH, GENTPEAK, GENTRANDOM, TOBRATROUGH, TOBRAPEAK, TOBRARND, AMIKACINPEAK, AMIKACINTROU, AMIKACIN in the last 72 hours.   Microbiology: No results found for this or any previous visit (from the past 720 hour(s)).  Medical History: Past Medical History  Diagnosis Date  . Atrophic vaginitis   . Elevated cholesterol   . Osteoporosis 2015    based on hip fracture from fall  DEXA 2014 T score -2.1 FRAX 19%/1.7%  . Pneumonia     Assessment: 66 yoF presents with 1 week history of fever and non-productive cough.  Recently completed a Z-pack for CAP but continues to have occasional fevers.  WBC elevated, tachycardic, CXR shows PNA.  Patient will be admitted and started for vancomycin and zosyn for sepsis due to pneumonia.    9/4 >> vancomycin >> 9/4 >> zosyn >>  Patient reports weight is 133 lbs (60 kg) SCr 0.59, CrCl~65 ml/min (CG, using SCr 0.8 adjusted for age) 9/4 blood cultures x 2: collected  Goal of Therapy:  Vancomycin trough level 15-20 mcg/ml  Doses adjusted per renal function Eradication of infection  Plan:  1.  Vancomycin 1g x 1 then 750 mg IV q12h 2.  Zosyn 3.375g x 1 now over 30 min then start 3.375g IV q8h (4 hour infusion time). 3.  F/u culture results, SCr, trough levels as needed.  Hershal Coria 03/18/2015,8:05 PM

## 2015-03-18 NOTE — H&P (Addendum)
PCP:  Criselda Peaches, MD    Referring provider Stevie PA   Chief Complaint: persistent PNA   HPI: [name] [name] is a 64 y.o. female   has a past medical history of Atrophic vaginitis; Elevated cholesterol; Osteoporosis (2015); and Pneumonia.   Presented with  1 week hx of fever,cough non-productive,   and right lower back pain. On 31 of August was diagnosed with CAP and treated with Z-pack. Patient continued to have occasional fevers poor appetite. Today she got up to the bathroom and felt hot shortly after she had a syncopal episode. Patient has 3 day hx of upper back pain as well since she woke up. It hurts worse when she tries to look up at the sky or sit up. Denies any numbness in her hands. Patient presented to emergency department she was found to have fever up to 100.1 white blood cell count 16.6 chest x-ray sure he anterior right lower lobe pneumonia and stable mild changes of COPD. She met criteria for sepsis with fever, tachycardia to 108, elevated white blood cell contact 16.4 blood cultures were obtained patient was started on broad-spectrum antibiotics vancomycin and Zosyn Hospitalist was called for admission for sepsis due to pneumonia  Review of Systems:    Pertinent positives include: Fevers, chills, fatigue, productive cough,   Constitutional:  No weight loss, night sweats, weight loss  HEENT:  No headaches, Difficulty swallowing,Tooth/dental problems,Sore throat,  No sneezing, itching, ear ache, nasal congestion, post nasal drip,  Cardio-vascular:  No chest pain, Orthopnea, PND, anasarca, dizziness, palpitations.no Bilateral lower extremity swelling  GI:  No heartburn, indigestion, abdominal pain, nausea, vomiting, diarrhea, change in bowel habits, loss of appetite, melena, blood in stool, hematemesis Resp:  no shortness of breath at rest. No dyspnea on exertion, No excess mucus, no No non-productive cough, No coughing up of blood.No change in color of mucus.No  wheezing. Skin:  no rash or lesions. No jaundice GU:  no dysuria, change in color of urine, no urgency or frequency. No straining to urinate.  No flank pain.  Musculoskeletal:  No joint pain or no joint swelling. No decreased range of motion. No back pain.  Psych:  No change in mood or affect. No depression or anxiety. No memory loss.  Neuro: no localizing neurological complaints, no tingling, no weakness, no double vision, no gait abnormality, no slurred speech, no confusion  Otherwise ROS are negative except for above, 10 systems were reviewed  Past Medical History: Past Medical History  Diagnosis Date  . Atrophic vaginitis   . Elevated cholesterol   . Osteoporosis 2015    based on hip fracture from fall  DEXA 2014 T score -2.1 FRAX 19%/1.7%  . Pneumonia    Past Surgical History  Procedure Laterality Date  . Finger surgery    . Hip arthroplasty Right 11/14/2013    Procedure: RIGHT CANNULATED HIP PINNING;  Surgeon: Alta Corning, MD;  Location: [facility];  Service: Orthopedics;  Laterality: Right;  BIOMET     Medications: Prior to Admission medications   Medication Sig Start Date End Date Taking? Authorizing Provider  acetaminophen (TYLENOL) 500 MG tablet Take 1,000 mg by mouth 3 (three) times daily as needed for moderate pain or fever.   Yes Historical Provider, MD  aspirin 81 MG tablet Take 81 mg by mouth daily.   Yes Historical Provider, MD  simvastatin (ZOCOR) 40 MG tablet Take 40 mg by mouth daily.    Yes Historical Provider, MD  alendronate (FOSAMAX)  70 MG tablet Take 1 tablet (70 mg total) by mouth every 7 (seven) days. Take with a full glass of water on an empty stomach. Patient not taking: Reported on 03/18/2015 04/12/14   [name], MD  aspirin EC 325 MG tablet Take 1 tablet (325 mg total) by mouth 2 (two) times daily after a meal. Take x 1 month post op to decrease risk of blood clots. Patient not taking: Reported on 03/18/2015 11/14/13   Gary Fleet, PA-C  Calcium  Carbonate-Vitamin D (CALCIUM + D PO) Take 1 tablet by mouth daily.     Historical Provider, MD    Allergies:   Allergies  Allergen Reactions  . Cephalexin Rash    Social History:  Ambulatory   independently    Lives at home alone      reports that she has never smoked. She has never used smokeless tobacco. She reports that she does not drink alcohol or use illicit drugs.    Family History: family history includes Heart disease in her father.    Physical Exam: Patient Vitals for the past 24 hrs:  BP Temp Temp src Pulse Resp SpO2  03/18/15 1814 132/58 mmHg 100.1 F (37.8 C) Oral 99 20 97 %  03/18/15 1659 - 100 F (37.8 C) Oral - - -  03/18/15 1547 155/79 mmHg 99.6 F (37.6 C) Oral 108 20 96 %    1. General:  in No Acute distress 2. Psychological: Alert and   Oriented 3. Head/ENT:   Moist  Mucous Membranes                          Head Non traumatic, neck supple                          Normal   Dentition 4. SKIN:  decreased Skin turgor,  Skin clean Dry and intact no rash 5. Heart: Regular rate and rhythm no Murmur, Rub or gallop 6. Lungs: no wheezes some crackles  Right > left, distant 7. Abdomen: Soft, non-tender, Non distended 8. Lower extremities: no clubbing, cyanosis, or edema 9. Neurologically Grossly intact, moving all 4 extremities equally 10. MSK: Normal range of motion, thoracic sine tenderness mild  body mass index is unknown because there is no weight on file.   Labs on Admission:   Results for orders placed or performed during the hospital encounter of 03/18/15 (from the past 24 hour(s))  CBC with Differential     Status: Abnormal   Collection Time: 03/18/15  4:59 PM  Result Value Ref Range   WBC 16.4 (H) 4.0 - 10.5 K/uL   RBC 3.94 3.87 - 5.11 MIL/uL   Hemoglobin 12.5 12.0 - 15.0 g/dL   HCT 38.0 36.0 - 46.0 %   MCV 96.4 78.0 - 100.0 fL   MCH 31.7 26.0 - 34.0 pg   MCHC 32.9 30.0 - 36.0 g/dL   RDW 12.1 11.5 - 15.5 %   Platelets 547 (H) 150 - 400  K/uL   Neutrophils Relative % 85 (H) 43 - 77 %   Neutro Abs 13.9 (H) 1.7 - 7.7 K/uL   Lymphocytes Relative 9 (L) 12 - 46 %   Lymphs Abs 1.5 0.7 - 4.0 K/uL   Monocytes Relative 5 3 - 12 %   Monocytes Absolute 0.9 0.1 - 1.0 K/uL   Eosinophils Relative 1 0 - 5 %   Eosinophils Absolute 0.1 0.0 -  0.7 K/uL   Basophils Relative 0 0 - 1 %   Basophils Absolute 0.0 0.0 - 0.1 K/uL  Basic metabolic panel     Status: Abnormal   Collection Time: 03/18/15  4:59 PM  Result Value Ref Range   Sodium 136 135 - 145 mmol/L   Potassium 3.4 (L) 3.5 - 5.1 mmol/L   Chloride 98 (L) 101 - 111 mmol/L   CO2 28 22 - 32 mmol/L   Glucose, Bld 134 (H) 65 - 99 mg/dL   BUN 7 6 - 20 mg/dL   Creatinine, Ser 0.59 0.44 - 1.00 mg/dL   Calcium 9.4 8.9 - 10.3 mg/dL   GFR calc non Af Amer >60 >60 mL/min   GFR calc Af Amer >60 >60 mL/min   Anion gap 10 5 - 15    UA not obtained  No results found for: HGBA1C  CrCl cannot be calculated (Unknown ideal weight.).  BNP (last 3 results) No results for input(s): PROBNP in the last 8760 hours.  Other results:  I have pearsonaly reviewed this: ECG REPORT  Not obtained  There were no vitals filed for this visit.   Cultures: No results found for: SDES, [place], CULT, REPTSTATUS   Radiological Exams on Admission: Dg Chest 2 View  03/18/2015   CLINICAL DATA:  Cough, shortness of breath, chest congestion, fever for the past 7 days. Followup pneumonia.  EXAM: CHEST  2 VIEW  COMPARISON:  03/14/2015.  FINDINGS: Minimal increase in patchy airspace opacity at the right lung base on the frontal view. This appears slightly less prominent on the lateral view and is currently seen below the major fissure. Clear left lung. The lungs remain mildly hyperexpanded. Normal sized heart. Stable mild scoliosis.  IMPRESSION: 1. Little change in anterior right lower lobe pneumonia. 2. Stable mild changes of COPD.   Electronically Signed   By: Claudie Revering M.D.   On: 03/18/2015 17:30     Chart has been reviewed  Family not  at  Bedside    Assessment/Plan  64 yo F with hx of Hyperlipidemia here with persistent low-grade fever and pleuritic back pain in spite of being treated with Z-Pak for presumed community-acquired pneumonia being admitted for sepsis and outpatient antibiotics failure.  Present on Admission:  . Recurrent pneumonia - will broaden antibiotics coverage to vancomycin and Zosyn given evidence of sepsis. Observe on telemetry given syncope pleuritic back pain and elevated d-dimer we will obtain CT angiogram chest to confirm pneumonia rule out PE  . Sepsis will administer IV fluids and broad-spectrum antibiotics currently hemodynamically appears to be stable  . Syncope most likely secondary to orthostasis per history. We'll check orthostatics administer IV fluids, monitor on telemetry cycle cardiac enzymes,  will order echo, and carotid Dopplers this could be discontinued and later time if patient has clear evidence of orthostasis.  Upper back pain is likely unrelated to the pleuritic back pain. Upper thoracic back pain seems to be more musculoskeletal in origin. Supportive management for now  Prophylaxis: Lovenox   CODE STATUS:  FULL CODE   as per patient    Disposition:  To home once workup is complete and patient is stable  Other plan as per orders.  I have spent a total of 55 min on this admission  [name] [name] 03/18/2015, 7:15 PM  [facility]  Pager [phone]   after 2 AM please page floor coverage PA If 7AM-7PM, please contact the day team taking care of the patient  [email or web address]  Password  [email or web address]

## 2015-03-18 NOTE — ED Provider Notes (Signed)
CSN: [id number]     Arrival date & time 03/18/15  1543 History   First MD Initiated Contact with Patient 03/18/15 1555     Chief Complaint  Patient presents with  . Fever   HPI  [name] [name] is a 64 year old female presenting today with fever and worsening back pain. Pt was diagnosed with RL pneumonia by her PCP and treated with a z pack (start date Monday, end date Saturday). Pt is presenting today with worsening back pain and persistent fevers. She states that she noted a fever at home last evening to 100 and feeling sweaty. Denies temperatures over 101. She also reports that her back pain is "spreading up my back". Pt reports that she had mid-right back pain with inspiration when she was diagnosed with PNA. The pain has improved in severity but she feels it further up her back. Pt also reports getting up in the middle of the night last night for a glass of water. When she stood, she got lightheaded and had to sit down. No fall to floor, head injury or LOC. Pt also endorses decreased appetite and dry cough unchanged since PNA diagnosis. Denies headache, neck stiffness, sore throat, worsening cough, chest pain, SOB, abdominal pain, nausea, vomiting, diarrhea, lightheadedness or syncope.  Past Medical History  Diagnosis Date  . Atrophic vaginitis   . Elevated cholesterol   . Osteoporosis 2015    based on hip fracture from fall  DEXA 2014 T score -2.1 FRAX 19%/1.7%  . Pneumonia    Past Surgical History  Procedure Laterality Date  . Finger surgery    . Hip arthroplasty Right 11/14/2013    Procedure: RIGHT CANNULATED HIP PINNING;  Surgeon: Alta Corning, MD;  Location: [facility];  Service: Orthopedics;  Laterality: Right;  BIOMET   Family History  Problem Relation Age of Onset  . Heart disease Father    Social History  Substance Use Topics  . Smoking status: Never Smoker   . Smokeless tobacco: Never Used  . Alcohol Use: No   OB History    Gravida Para Term Preterm AB TAB SAB Ectopic Multiple  Living   2 2 2       2      Review of Systems  Constitutional: Positive for fever, appetite change and fatigue. Negative for chills.  HENT: Negative for congestion and sore throat.   Respiratory: Positive for cough. Negative for shortness of breath and wheezing.   Cardiovascular: Negative for chest pain.  Gastrointestinal: Negative for nausea, vomiting, abdominal pain and diarrhea.  Musculoskeletal: Positive for back pain. Negative for myalgias, neck pain and neck stiffness.  Neurological: Negative for dizziness, syncope, light-headedness and headaches.      Allergies  Cephalexin  Home Medications   Prior to Admission medications   Medication Sig Start Date End Date Taking? Authorizing Provider  acetaminophen (TYLENOL) 500 MG tablet Take 1,000 mg by mouth 3 (three) times daily as needed for moderate pain or fever.   Yes Historical Provider, MD  aspirin 81 MG tablet Take 81 mg by mouth daily.   Yes Historical Provider, MD  simvastatin (ZOCOR) 40 MG tablet Take 40 mg by mouth daily.    Yes Historical Provider, MD  alendronate (FOSAMAX) 70 MG tablet Take 1 tablet (70 mg total) by mouth every 7 (seven) days. Take with a full glass of water on an empty stomach. Patient not taking: Reported on 03/18/2015 04/12/14   [name], MD  aspirin EC 325 MG tablet  Take 1 tablet (325 mg total) by mouth 2 (two) times daily after a meal. Take x 1 month post op to decrease risk of blood clots. Patient not taking: Reported on 03/18/2015 11/14/13   Gary Fleet, PA-C  Calcium Carbonate-Vitamin D (CALCIUM + D PO) Take 1 tablet by mouth daily.     Historical Provider, MD   BP 134/57 mmHg  Pulse 96  Temp(Src) 98.8 F (37.1 C) (Oral)  Resp 18  Ht 5\' 7"  (1.702 m)  Wt 133 lb 9.6 oz (60.6 kg)  BMI 20.92 kg/m2  SpO2 96%  LMP 07/14/1993 Physical Exam  Constitutional: She is oriented to person, place, and time. She appears well-developed and well-nourished. No distress.  HENT:  Head: Normocephalic and  atraumatic.  Mouth/Throat: Oropharynx is clear and moist. No oropharyngeal exudate.  Eyes: Conjunctivae and EOM are normal. Pupils are equal, round, and reactive to light.  Neck: Normal range of motion.  Cardiovascular: Normal rate, regular rhythm and normal heart sounds.   No murmur heard. Pulmonary/Chest: Effort normal. No accessory muscle usage. No respiratory distress. She has decreased breath sounds in the right middle field and the right lower field. She has no wheezes. She has no rhonchi. She has no rales.  Pt resting comfortably, breathing unlabored. Frequent dry cough  Abdominal: Soft. There is no tenderness.  Musculoskeletal: Normal range of motion.  Neurological: She is alert and oriented to person, place, and time.  Skin: Skin is warm and dry.  Psychiatric: She has a normal mood and affect. Her behavior is normal.  Nursing note and vitals reviewed.   ED Course  Procedures (including critical care time) Labs Review Labs Reviewed  CBC WITH DIFFERENTIAL/PLATELET - Abnormal; Notable for the following:    WBC 16.4 (*)    Platelets 547 (*)    Neutrophils Relative % 85 (*)    Neutro Abs 13.9 (*)    Lymphocytes Relative 9 (*)    All other components within normal limits  BASIC METABOLIC PANEL - Abnormal; Notable for the following:    Potassium 3.4 (*)    Chloride 98 (*)    Glucose, Bld 134 (*)    All other components within normal limits  URINALYSIS, ROUTINE W REFLEX MICROSCOPIC (NOT AT [facility]) - Abnormal; Notable for the following:    APPearance CLOUDY (*)    All other components within normal limits  CULTURE, BLOOD (ROUTINE X 2)  CULTURE, BLOOD (ROUTINE X 2)  CULTURE, EXPECTORATED SPUTUM-ASSESSMENT  GRAM STAIN  TROPONIN I  TROPONIN I  TROPONIN I  D-DIMER, QUANTITATIVE (NOT AT [facility])  LEGIONELLA ANTIGEN, URINE  STREP PNEUMONIAE URINARY ANTIGEN  COMPREHENSIVE METABOLIC PANEL  CBC  CREATININE, SERUM  CBC WITH DIFFERENTIAL/PLATELET  I-STAT CG4 LACTIC ACID, ED     Imaging Review Dg Chest 2 View  03/18/2015   CLINICAL DATA:  Cough, shortness of breath, chest congestion, fever for the past 7 days. Followup pneumonia.  EXAM: CHEST  2 VIEW  COMPARISON:  03/14/2015.  FINDINGS: Minimal increase in patchy airspace opacity at the right lung base on the frontal view. This appears slightly less prominent on the lateral view and is currently seen below the major fissure. Clear left lung. The lungs remain mildly hyperexpanded. Normal sized heart. Stable mild scoliosis.  IMPRESSION: 1. Little change in anterior right lower lobe pneumonia. 2. Stable mild changes of COPD.   Electronically Signed   By: Claudie Revering M.D.   On: 03/18/2015 17:30   I have personally reviewed and evaluated these  images and lab results as part of my medical decision-making.   EKG Interpretation None      MDM   Final diagnoses:  Syncope   Pt presenting with fevers and worsening back pain. Pt recently diagnosed with RL PNA and completed a z pack yesterday. Pt reports that she noted a fever of 100 last night and felt "sweaty". Pt also complaining of back pain moving further up her back. Decreased in severity since PNA diagnosis. Cough persistent. VSS. Pt afebrile on presentation to ED. Nontoxic, resting comfortably and breathing unlabored. Decreased breathing sounds in right lower field. Coughing throughout exam. Pt reports increase in pain with inspiration in back. WBC 16.4. HR 108. CXR shows little change in RLL PNA. Pt meets sepsis criteria. Will order blood cultures x 2. Pharmacy consult to start pt on vanc and zosyn. Will consult with hospitalist Dr. Roel Cluck for admission for recurrent pneumonia complicated by sepsis.      Josephina Gip, PA-C 03/18/15 2119  Leo Grosser, MD 03/21/15 1028

## 2015-03-19 ENCOUNTER — Inpatient Hospital Stay (HOSPITAL_COMMUNITY): Payer: No Typology Code available for payment source

## 2015-03-19 DIAGNOSIS — R55 Syncope and collapse: Secondary | ICD-10-CM

## 2015-03-19 LAB — STREP PNEUMONIAE URINARY ANTIGEN: STREP PNEUMO URINARY ANTIGEN: NEGATIVE

## 2015-03-19 LAB — LEGIONELLA ANTIGEN, URINE

## 2015-03-19 LAB — COMPREHENSIVE METABOLIC PANEL
ALT: 13 U/L — ABNORMAL LOW (ref 14–54)
AST: 22 U/L (ref 15–41)
Albumin: 3.2 g/dL — ABNORMAL LOW (ref 3.5–5.0)
Alkaline Phosphatase: 119 U/L (ref 38–126)
Anion gap: 8 (ref 5–15)
BILIRUBIN TOTAL: 0.7 mg/dL (ref 0.3–1.2)
BUN: 7 mg/dL (ref 6–20)
CHLORIDE: 100 mmol/L — AB (ref 101–111)
CO2: 29 mmol/L (ref 22–32)
CREATININE: 0.66 mg/dL (ref 0.44–1.00)
Calcium: 9 mg/dL (ref 8.9–10.3)
Glucose, Bld: 123 mg/dL — ABNORMAL HIGH (ref 65–99)
POTASSIUM: 3.6 mmol/L (ref 3.5–5.1)
Sodium: 137 mmol/L (ref 135–145)
TOTAL PROTEIN: 7.5 g/dL (ref 6.5–8.1)

## 2015-03-19 LAB — CBC WITH DIFFERENTIAL/PLATELET
BASOS ABS: 0 10*3/uL (ref 0.0–0.1)
Basophils Relative: 0 % (ref 0–1)
EOS ABS: 0.3 10*3/uL (ref 0.0–0.7)
Eosinophils Relative: 2 % (ref 0–5)
HCT: 37.2 % (ref 36.0–46.0)
HEMOGLOBIN: 11.9 g/dL — AB (ref 12.0–15.0)
LYMPHS ABS: 2.1 10*3/uL (ref 0.7–4.0)
Lymphocytes Relative: 14 % (ref 12–46)
MCH: 31.1 pg (ref 26.0–34.0)
MCHC: 32 g/dL (ref 30.0–36.0)
MCV: 97.1 fL (ref 78.0–100.0)
Monocytes Absolute: 1.1 10*3/uL — ABNORMAL HIGH (ref 0.1–1.0)
Monocytes Relative: 7 % (ref 3–12)
NEUTROS PCT: 77 % (ref 43–77)
Neutro Abs: 11.7 10*3/uL — ABNORMAL HIGH (ref 1.7–7.7)
PLATELETS: 526 10*3/uL — AB (ref 150–400)
RBC: 3.83 MIL/uL — AB (ref 3.87–5.11)
RDW: 12.3 % (ref 11.5–15.5)
WBC: 15.3 10*3/uL — AB (ref 4.0–10.5)

## 2015-03-19 LAB — TROPONIN I
Troponin I: <0.03 ng/mL (ref <0.031)
Troponin I: <0.03 ng/mL (ref <0.031)

## 2015-03-19 MED ORDER — SODIUM CHLORIDE 0.9 % IV BOLUS (SEPSIS)
500.0000 mL | Freq: Once | INTRAVENOUS | Status: AC
  Administered 2015-03-19: 500 mL via INTRAVENOUS

## 2015-03-19 MED ORDER — ACETAMINOPHEN 325 MG PO TABS
650.0000 mg | ORAL_TABLET | Freq: Four times a day (QID) | ORAL | Status: DC | PRN
  Administered 2015-03-19 – 2015-03-20 (×4): 650 mg via ORAL
  Filled 2015-03-19 (×4): qty 2

## 2015-03-19 MED ORDER — FLUCONAZOLE 100 MG PO TABS
100.0000 mg | ORAL_TABLET | Freq: Every day | ORAL | Status: AC
  Administered 2015-03-19 – 2015-03-21 (×3): 100 mg via ORAL
  Filled 2015-03-19 (×4): qty 1

## 2015-03-19 NOTE — Progress Notes (Addendum)
[facility] PROGRESS NOTE  [name] [name] OVA:[id number] DOB: 05/01/1951 DOA: 03/18/2015 PCP: Criselda Peaches, MD  Assessment/Plan: Active Problems:   Recurrent pneumonia   Sepsis   Syncope    Multifocal pneumonia - status post completion of azithromycin, continue broad-spectrum antibiotics coverage with vancomycin and Zosyn given evidence of sepsis. Follow blood culture closely, Observe on telemetry given syncope pleuritic back pain and elevated d-dimer, CT angiogram chest negative for PE , shows multifocal pneumonia, strep pneumo and Legionella antigen in the urine is negative   . Sepsis will repeat another fluid bolus today and broad-spectrum antibiotics currently hemodynamically appears to be stable   . Syncope most likely secondary to orthostasis per history. Continue generous IV fluids, monitor on telemetry , negative cardiac enzymes, follow results of 2-D echo, and carotid Dopplers   Upper back pain is likely unrelated to the pleuritic back pain. Upper thoracic back pain seems to be more musculoskeletal in origin. Supportive management for now  Yeast prophylaxis-Diflucan 100 mg by mouth 3 days   Code Status:      Code Status Orders        Start     Ordered   03/18/15 2108  Full code   Continuous     03/18/15 2107     Family Communication: family updated about patient's clinical progress Disposition Plan:  Anticipate discharge tomorrow if culture remains negative  Brief narrative: [name] [name] is a 64 y.o. female   has a past medical history of Atrophic vaginitis; Elevated cholesterol; Osteoporosis (2015); and Pneumonia.   Presented with  1 week hx of fever,cough non-productive, and right lower back pain. On 31 of August was diagnosed with CAP and treated with Z-pack. Patient continued to have occasional fevers poor appetite. Today she got up to the bathroom and felt hot shortly after she had a syncopal episode. Patient has 3 day hx of upper back pain as  well since she woke up. It hurts worse when she tries to look up at the sky or sit up. Denies any numbness in her hands. Patient presented to emergency department she was found to have fever up to 100.1 white blood cell count 16.6 chest x-ray sure he anterior right lower lobe pneumonia and stable mild changes of COPD. She met criteria for sepsis with fever, tachycardia to 108, elevated white blood cell contact 16.4 blood cultures were obtained patient was started on broad-spectrum antibiotics vancomycin and Zosyn Hospitalist was called for admission for sepsis due to pneumonia  Consultants:  None  Procedures:  None  Antibiotics: Anti-infectives    Start     Dose/Rate Route Frequency Ordered Stop   03/19/15 1130  fluconazole (DIFLUCAN) tablet 100 mg     100 mg Oral Daily 03/19/15 1117 03/22/15 0959   03/19/15 1000  vancomycin (VANCOCIN) IVPB 750 mg/150 ml premix     750 mg 150 mL/hr over 60 Minutes Intravenous Every 12 hours 03/18/15 2023     03/19/15 0200  piperacillin-tazobactam (ZOSYN) IVPB 3.375 g     3.375 g 12.5 mL/hr over 240 Minutes Intravenous Every 8 hours 03/18/15 2022     03/18/15 2030  vancomycin (VANCOCIN) IVPB 1000 mg/200 mL premix     1,000 mg 200 mL/hr over 60 Minutes Intravenous  Once 03/18/15 2022 03/18/15 2330   03/18/15 2015  piperacillin-tazobactam (ZOSYN) IVPB 3.375 g     3.375 g 100 mL/hr over 30 Minutes Intravenous NOW 03/18/15 2014 03/18/15 2121  HPI/Subjective: Patient denies any cough, chest pain, shortness of breath, continues to have low-grade fever this morning of 100.9  Objective: Filed Vitals:   03/18/15 2024 03/18/15 2108 03/19/15 0208 03/19/15 0514  BP: 126/62 134/57 111/51 98/81  Pulse: 105 96 87 103  Temp:  98.8 F (37.1 C) 99.4 F (37.4 C) 100.9 F (38.3 C)  TempSrc:  Oral Oral Oral  Resp: 16 18 16 16   Height:  5\' 7"  (1.702 m)    Weight:  60.6 kg (133 lb 9.6 oz)    SpO2: 93% 96% 98% 96%    Intake/Output Summary (Last 24  hours) at 03/19/15 1117 Last data filed at 03/19/15 0655  Gross per 24 hour  Intake 1441.67 ml  Output    600 ml  Net 841.67 ml    Exam:  General: No acute respiratory distress Lungs: Clear to auscultation bilaterally without wheezes or crackles Cardiovascular: Regular rate and rhythm without murmur gallop or rub normal S1 and S2 Abdomen: Nontender, nondistended, soft, bowel sounds positive, no rebound, no ascites, no appreciable mass Extremities: No significant cyanosis, clubbing, or edema bilateral lower extremities     Data Review   Micro Results No results found for this or any previous visit (from the past 240 hour(s)).  Radiology Reports Dg Chest 2 View  03/18/2015   CLINICAL DATA:  Cough, shortness of breath, chest congestion, fever for the past 7 days. Followup pneumonia.  EXAM: CHEST  2 VIEW  COMPARISON:  03/14/2015.  FINDINGS: Minimal increase in patchy airspace opacity at the right lung base on the frontal view. This appears slightly less prominent on the lateral view and is currently seen below the major fissure. Clear left lung. The lungs remain mildly hyperexpanded. Normal sized heart. Stable mild scoliosis.  IMPRESSION: 1. Little change in anterior right lower lobe pneumonia. 2. Stable mild changes of COPD.   Electronically Signed   By: 05/18/2015 M.D.   On: 03/18/2015 17:30   Dg Chest 2 View  03/14/2015   CLINICAL DATA:  Pneumonia.  High fever last night.  Cough.  EXAM: CHEST - 2 VIEW  COMPARISON:  One-view chest 11/14/2013  FINDINGS: The heart size is normal. Emphysematous changes are again noted. New airspace opacity is evident inferiorly in the right middle lobe. No other focal airspace consolidation is present. There is no edema or effusion. Mild rightward curvature is present in the thoracic spine. Endplate degenerative changes are noted. Atherosclerotic calcifications are again seen within the thoracic aorta. The visualized soft tissues and bony thorax are  unremarkable otherwise.  IMPRESSION: 1. New right middle lobe pneumonia. 2. Emphysema.   Electronically Signed   By: 03/16/2015 M.D.   On: 03/14/2015 13:32   Ct Angio Chest Pe W/cm &/or Wo Cm  03/18/2015   CLINICAL DATA:  64 year old female with fever and nonproductive cough.  EXAM: CT ANGIOGRAPHY CHEST WITH CONTRAST  TECHNIQUE: Multidetector CT imaging of the chest was performed using the standard protocol during bolus administration of intravenous contrast. Multiplanar CT image reconstructions and MIPs were obtained to evaluate the vascular anatomy.  CONTRAST:  05/18/2015 OMNIPAQUE IOHEXOL 350 MG/ML SOLN  COMPARISON:  Radiograph dated 03/18/2015  FINDINGS: Evaluation of this exam is limited due to respiratory motion artifact.  There are patchy areas of ground-glass airspace opacity predominantly involving the right lower and right middle lobe with smaller areas involving the upper lobes bilaterally most compatible with multifocal pneumonia. Clinical correlation and follow-up to resolution is recommended. There is mild emphysematous  changes of the lungs. There is no pleural effusion. The central airways are patent.  The visualized thoracic aorta appear unremarkable. No CT evidence of pulmonary embolism. There is no cardiomegaly or pericardial effusion. There is right hilar adenopathy. The visualized esophagus and thyroid gland appear unremarkable. There is no axillary adenopathy. The thoracic wall soft tissues are unremarkable. There is degenerative changes of the spine. No acute fracture. A small focal area of sclerosis along the anterior aspect of the T8 along the superior endplate is likely related to chronic changes. The visualized upper abdomen appear unremarkable.  Review of the MIP images confirms the above findings.  IMPRESSION: No CT evidence of pulmonary embolism.  Multifocal pneumonia. Clinical correlation and follow-up to resolution recommended.   Electronically Signed   By: Anner Crete M.D.    On: 03/18/2015 22:52     CBC  Recent Labs Lab 03/18/15 1659 03/19/15 0220  WBC 16.4* 15.3*  HGB 12.5 11.9*  HCT 38.0 37.2  PLT 547* 526*  MCV 96.4 97.1  MCH 31.7 31.1  MCHC 32.9 32.0  RDW 12.1 12.3  LYMPHSABS 1.5 2.1  MONOABS 0.9 1.1*  EOSABS 0.1 0.3  BASOSABS 0.0 0.0    Chemistries   Recent Labs Lab 03/18/15 1659 03/19/15 0220  NA 136 137  K 3.4* 3.6  CL 98* 100*  CO2 28 29  GLUCOSE 134* 123*  BUN 7 7  CREATININE 0.59 0.66  CALCIUM 9.4 9.0  AST  --  22  ALT  --  13*  ALKPHOS  --  119  BILITOT  --  0.7   ------------------------------------------------------------------------------------------------------------------ estimated creatinine clearance is 67.1 mL/min (by C-G formula based on Cr of 0.66). ------------------------------------------------------------------------------------------------------------------ No results for input(s): HGBA1C in the last 72 hours. ------------------------------------------------------------------------------------------------------------------ No results for input(s): CHOL, HDL, LDLCALC, TRIG, CHOLHDL, LDLDIRECT in the last 72 hours. ------------------------------------------------------------------------------------------------------------------ No results for input(s): TSH, T4TOTAL, T3FREE, THYROIDAB in the last 72 hours.  Invalid input(s): FREET3 ------------------------------------------------------------------------------------------------------------------ No results for input(s): VITAMINB12, FOLATE, FERRITIN, TIBC, IRON, RETICCTPCT in the last 72 hours.  Coagulation profile No results for input(s): INR, PROTIME in the last 168 hours.   Recent Labs  03/18/15 2015  DDIMER 0.99*    Cardiac Enzymes  Recent Labs Lab 03/18/15 2015 03/19/15 0220 03/19/15 0743  TROPONINI <0.03 <0.03 <0.03    ------------------------------------------------------------------------------------------------------------------ Invalid input(s): POCBNP   CBG: No results for input(s): GLUCAP in the last 168 hours.     Studies: Dg Chest 2 View  03/18/2015   CLINICAL DATA:  Cough, shortness of breath, chest congestion, fever for the past 7 days. Followup pneumonia.  EXAM: CHEST  2 VIEW  COMPARISON:  03/14/2015.  FINDINGS: Minimal increase in patchy airspace opacity at the right lung base on the frontal view. This appears slightly less prominent on the lateral view and is currently seen below the major fissure. Clear left lung. The lungs remain mildly hyperexpanded. Normal sized heart. Stable mild scoliosis.  IMPRESSION: 1. Little change in anterior right lower lobe pneumonia. 2. Stable mild changes of COPD.   Electronically Signed   By: Claudie Revering M.D.   On: 03/18/2015 17:30   Ct Angio Chest Pe W/cm &/or Wo Cm  03/18/2015   CLINICAL DATA:  64 year old female with fever and nonproductive cough.  EXAM: CT ANGIOGRAPHY CHEST WITH CONTRAST  TECHNIQUE: Multidetector CT imaging of the chest was performed using the standard protocol during bolus administration of intravenous contrast. Multiplanar CT image reconstructions and MIPs were obtained to evaluate the vascular anatomy.  CONTRAST:  156m OMNIPAQUE IOHEXOL 350 MG/ML SOLN  COMPARISON:  Radiograph dated 03/18/2015  FINDINGS: Evaluation of this exam is limited due to respiratory motion artifact.  There are patchy areas of ground-glass airspace opacity predominantly involving the right lower and right middle lobe with smaller areas involving the upper lobes bilaterally most compatible with multifocal pneumonia. Clinical correlation and follow-up to resolution is recommended. There is mild emphysematous changes of the lungs. There is no pleural effusion. The central airways are patent.  The visualized thoracic aorta appear unremarkable. No CT evidence of pulmonary  embolism. There is no cardiomegaly or pericardial effusion. There is right hilar adenopathy. The visualized esophagus and thyroid gland appear unremarkable. There is no axillary adenopathy. The thoracic wall soft tissues are unremarkable. There is degenerative changes of the spine. No acute fracture. A small focal area of sclerosis along the anterior aspect of the T8 along the superior endplate is likely related to chronic changes. The visualized upper abdomen appear unremarkable.  Review of the MIP images confirms the above findings.  IMPRESSION: No CT evidence of pulmonary embolism.  Multifocal pneumonia. Clinical correlation and follow-up to resolution recommended.   Electronically Signed   By: AAnner CreteM.D.   On: 03/18/2015 22:52      No results found for: HGBA1C Lab Results  Component Value Date   CREATININE 0.66 03/19/2015       Scheduled Meds: . aspirin EC  81 mg Oral Daily  . enoxaparin (LOVENOX) injection  40 mg Subcutaneous QHS  . feeding supplement (ENSURE ENLIVE)  237 mL Oral BID BM  . fluconazole  100 mg Oral Daily  . piperacillin-tazobactam (ZOSYN)  IV  3.375 g Intravenous Q8H  . simvastatin  40 mg Oral Daily  . vancomycin  750 mg Intravenous Q12H   Continuous Infusions: . sodium chloride 100 mL/hr at 03/19/15 1045    Active Problems:   Recurrent pneumonia   Sepsis   Syncope    Time spent: 45 minutes   [facility] Pager [phone] If 7PM-7AM, please contact night-coverage at [email or web address], password [email or web address]9/11/2014, 11:17 AM  LOS: 1 day

## 2015-03-19 NOTE — Progress Notes (Signed)
Echocardiogram 2D Echocardiogram has been performed.  [name] [name] 03/19/2015, 9:51 AM

## 2015-03-20 ENCOUNTER — Inpatient Hospital Stay (HOSPITAL_COMMUNITY): Payer: No Typology Code available for payment source

## 2015-03-20 DIAGNOSIS — R55 Syncope and collapse: Secondary | ICD-10-CM

## 2015-03-20 LAB — C DIFFICILE QUICK SCREEN W PCR REFLEX
C DIFFICILE (CDIFF) INTERP: NEGATIVE
C DIFFICILE (CDIFF) TOXIN: NEGATIVE
C Diff antigen: NEGATIVE

## 2015-03-20 LAB — COMPREHENSIVE METABOLIC PANEL
ALBUMIN: 2.9 g/dL — AB (ref 3.5–5.0)
ALT: 14 U/L (ref 14–54)
ANION GAP: 10 (ref 5–15)
AST: 23 U/L (ref 15–41)
Alkaline Phosphatase: 118 U/L (ref 38–126)
BUN: 8 mg/dL (ref 6–20)
CHLORIDE: 98 mmol/L — AB (ref 101–111)
CO2: 27 mmol/L (ref 22–32)
Calcium: 8.7 mg/dL — ABNORMAL LOW (ref 8.9–10.3)
Creatinine, Ser: 0.57 mg/dL (ref 0.44–1.00)
GFR calc non Af Amer: >60 mL/min (ref >60)
GLUCOSE: 120 mg/dL — AB (ref 65–99)
Potassium: 3.3 mmol/L — ABNORMAL LOW (ref 3.5–5.1)
SODIUM: 135 mmol/L (ref 135–145)
Total Bilirubin: 0.6 mg/dL (ref 0.3–1.2)
Total Protein: 6.7 g/dL (ref 6.5–8.1)

## 2015-03-20 LAB — CBC
HCT: 35.1 % — ABNORMAL LOW (ref 36.0–46.0)
HEMOGLOBIN: 11.4 g/dL — AB (ref 12.0–15.0)
MCH: 31.7 pg (ref 26.0–34.0)
MCHC: 32.5 g/dL (ref 30.0–36.0)
MCV: 97.5 fL (ref 78.0–100.0)
PLATELETS: 504 10*3/uL — AB (ref 150–400)
RBC: 3.6 MIL/uL — AB (ref 3.87–5.11)
RDW: 12.3 % (ref 11.5–15.5)
WBC: 15.4 10*3/uL — AB (ref 4.0–10.5)

## 2015-03-20 MED ORDER — METHOCARBAMOL 500 MG PO TABS
500.0000 mg | ORAL_TABLET | Freq: Three times a day (TID) | ORAL | Status: DC | PRN
  Administered 2015-03-21: 500 mg via ORAL
  Filled 2015-03-20: qty 1

## 2015-03-20 MED ORDER — SACCHAROMYCES BOULARDII 250 MG PO CAPS
250.0000 mg | ORAL_CAPSULE | Freq: Two times a day (BID) | ORAL | Status: DC
  Administered 2015-03-20 – 2015-03-21 (×3): 250 mg via ORAL
  Filled 2015-03-20 (×4): qty 1

## 2015-03-20 NOTE — Progress Notes (Signed)
[facility] PROGRESS NOTE  [name] [name] MBB:[id number] DOB: Mar 01, 1951 DOA: 03/18/2015 PCP: Criselda Peaches, MD  Assessment/Plan: Active Problems:   Recurrent pneumonia   Sepsis   Syncope    Multifocal pneumonia - status post completion of azithromycin, continue broad-spectrum antibiotics coverage with vancomycin and Zosyn given evidence of sepsis, patient still has an elevated WBC count, still continues to have low-grade fevers every morning,. Follow blood culture closely, discontinue telemetry as it appears stable, given syncope pleuritic back pain and elevated d-dimer, CT angiogram chest was done which was negative for PE , shows multifocal pneumonia, strep pneumo and Legionella antigen in the urine is negative. Continue broad-spectrum antibiotics pending culture results  Diarrhea-patient had several bowel movements this morning, probably antibiotics induced, rule out C. difficile, started the patient on florastor   . Sepsis secondary to multifocal pneumonia, continue IV hydration and broad-spectrum antibiotics currently hemodynamically appears to be stable ,  . Syncope most likely secondary to orthostasis per history. Continue generous IV fluids, negative telemetry , negative cardiac enzymes, f  2-D echo within normal limits, DC carotid Dopplers and these are not indicated at this time  Upper back pain is likely unrelated to the pleuritic back pain. Upper thoracic back pain seems to be more musculoskeletal in origin. Patient has been started on Robaxin, continue Tylenol  Yeast prophylaxis-Diflucan 100 mg by mouth 3 days   Code Status:      Code Status Orders        Start     Ordered   03/18/15 2108  Full code   Continuous     03/18/15 2107     Family Communication: family updated about patient's clinical progress Disposition Plan:  Anticipate discharge tomorrow if culture remains negative  Brief narrative: [name] [name] is a 64 y.o. female   has a past medical  history of Atrophic vaginitis; Elevated cholesterol; Osteoporosis (2015); and Pneumonia.   Presented with  1 week hx of fever,cough non-productive, and right lower back pain. On 31 of August was diagnosed with CAP and treated with Z-pack. Patient continued to have occasional fevers poor appetite. Today she got up to the bathroom and felt hot shortly after she had a syncopal episode. Patient has 3 day hx of upper back pain as well since she woke up. It hurts worse when she tries to look up at the sky or sit up. Denies any numbness in her hands. Patient presented to emergency department she was found to have fever up to 100.1 white blood cell count 16.6 chest x-ray sure he anterior right lower lobe pneumonia and stable mild changes of COPD. She met criteria for sepsis with fever, tachycardia to 108, elevated white blood cell contact 16.4 blood cultures were obtained patient was started on broad-spectrum antibiotics vancomycin and Zosyn Hospitalist was called for admission for sepsis due to pneumonia  Consultants:  None  Procedures:  None  Antibiotics: Anti-infectives    Start     Dose/Rate Route Frequency Ordered Stop   03/19/15 1400  fluconazole (DIFLUCAN) tablet 100 mg     100 mg Oral Daily 03/19/15 1117 03/22/15 0959   03/19/15 1000  vancomycin (VANCOCIN) IVPB 750 mg/150 ml premix     750 mg 150 mL/hr over 60 Minutes Intravenous Every 12 hours 03/18/15 2023     03/19/15 0200  piperacillin-tazobactam (ZOSYN) IVPB 3.375 g     3.375 g 12.5 mL/hr over 240 Minutes Intravenous Every 8 hours 03/18/15 2022  03/18/15 2030  vancomycin (VANCOCIN) IVPB 1000 mg/200 mL premix     1,000 mg 200 mL/hr over 60 Minutes Intravenous  Once 03/18/15 2022 03/18/15 2330   03/18/15 2015  piperacillin-tazobactam (ZOSYN) IVPB 3.375 g     3.375 g 100 mL/hr over 30 Minutes Intravenous NOW 03/18/15 2014 03/18/15 2121         HPI/Subjective: Patient denies any cough, chest pain, shortness of breath,  continues to have low-grade fever this morning of 100.9  Objective: Filed Vitals:   03/19/15 2144 03/20/15 0153 03/20/15 0455 03/20/15 0702  BP: 120/58 102/58 124/52   Pulse: 84 100 97   Temp: 98.2 F (36.8 C) 99.1 F (37.3 C) 100.2 F (37.9 C) 98.1 F (36.7 C)  TempSrc: Oral Oral Oral Oral  Resp: _0 Height:      Weight:      SpO2: 98% 98% 94%     Intake/Output Summary (Last 24 hours) at 03/20/15 1214 Last data filed at 03/20/15 0700  Gross per 24 hour  Intake    440 ml  Output   1975 ml  Net  -1535 ml    Exam:  General: No acute respiratory distress Lungs: Clear to auscultation bilaterally without wheezes or crackles Cardiovascular: Regular rate and rhythm without murmur gallop or rub normal S1 and S2 Abdomen: Nontender, nondistended, soft, bowel sounds positive, no rebound, no ascites, no appreciable mass Extremities: No significant cyanosis, clubbing, or edema bilateral lower extremities     Data Review   Micro Results No results found for this or any previous visit (from the past 240 hour(s)).  Radiology Reports Dg Chest 2 View  03/18/2015   CLINICAL DATA:  Cough, shortness of breath, chest congestion, fever for the past 7 days. Followup pneumonia.  EXAM: CHEST  2 VIEW  COMPARISON:  03/14/2015.  FINDINGS: Minimal increase in patchy airspace opacity at the right lung base on the frontal view. This appears slightly less prominent on the lateral view and is currently seen below the major fissure. Clear left lung. The lungs remain mildly hyperexpanded. Normal sized heart. Stable mild scoliosis.  IMPRESSION: 1. Little change in anterior right lower lobe pneumonia. 2. Stable mild changes of COPD.   Electronically Signed   By: Claudie Revering M.D.   On: 03/18/2015 17:30   Dg Chest 2 View  03/14/2015   CLINICAL DATA:  Pneumonia.  High fever last night.  Cough.  EXAM: CHEST - 2 VIEW  COMPARISON:  One-view chest 11/14/2013  FINDINGS: The heart size is normal.  Emphysematous changes are again noted. New airspace opacity is evident inferiorly in the right middle lobe. No other focal airspace consolidation is present. There is no edema or effusion. Mild rightward curvature is present in the thoracic spine. Endplate degenerative changes are noted. Atherosclerotic calcifications are again seen within the thoracic aorta. The visualized soft tissues and bony thorax are unremarkable otherwise.  IMPRESSION: 1. New right middle lobe pneumonia. 2. Emphysema.   Electronically Signed   By: San Morelle M.D.   On: 03/14/2015 13:32   Ct Angio Chest Pe W/cm &/or Wo Cm  03/18/2015   CLINICAL DATA:  64 year old female with fever and nonproductive cough.  EXAM: CT ANGIOGRAPHY CHEST WITH CONTRAST  TECHNIQUE: Multidetector CT imaging of the chest was performed using the standard protocol during bolus administration of intravenous contrast. Multiplanar CT image reconstructions and MIPs were obtained to evaluate the vascular anatomy.  CONTRAST:  117m OMNIPAQUE IOHEXOL 350 MG/ML SOLN  COMPARISON:  Radiograph dated 03/18/2015  FINDINGS: Evaluation of this exam is limited due to respiratory motion artifact.  There are patchy areas of ground-glass airspace opacity predominantly involving the right lower and right middle lobe with smaller areas involving the upper lobes bilaterally most compatible with multifocal pneumonia. Clinical correlation and follow-up to resolution is recommended. There is mild emphysematous changes of the lungs. There is no pleural effusion. The central airways are patent.  The visualized thoracic aorta appear unremarkable. No CT evidence of pulmonary embolism. There is no cardiomegaly or pericardial effusion. There is right hilar adenopathy. The visualized esophagus and thyroid gland appear unremarkable. There is no axillary adenopathy. The thoracic wall soft tissues are unremarkable. There is degenerative changes of the spine. No acute fracture. A small focal  area of sclerosis along the anterior aspect of the T8 along the superior endplate is likely related to chronic changes. The visualized upper abdomen appear unremarkable.  Review of the MIP images confirms the above findings.  IMPRESSION: No CT evidence of pulmonary embolism.  Multifocal pneumonia. Clinical correlation and follow-up to resolution recommended.   Electronically Signed   By: Anner Crete M.D.   On: 03/18/2015 22:52     CBC  Recent Labs Lab 03/18/15 1659 03/19/15 0220 03/20/15 0423  WBC 16.4* 15.3* 15.4*  HGB 12.5 11.9* 11.4*  HCT 38.0 37.2 35.1*  PLT 547* 526* 504*  MCV 96.4 97.1 97.5  MCH 31.7 31.1 31.7  MCHC 32.9 32.0 32.5  RDW 12.1 12.3 12.3  LYMPHSABS 1.5 2.1  --   MONOABS 0.9 1.1*  --   EOSABS 0.1 0.3  --   BASOSABS 0.0 0.0  --     Chemistries   Recent Labs Lab 03/18/15 1659 03/19/15 0220 03/20/15 0423  NA 136 137 135  K 3.4* 3.6 3.3*  CL 98* 100* 98*  CO2 _0 GLUCOSE 134* 123* 120*  BUN _1 CREATININE 0.59 0.66 0.57  CALCIUM 9.4 9.0 8.7*  AST  --  22 23  ALT  --  13* 14  ALKPHOS  --  119 118  BILITOT  --  0.7 0.6   ------------------------------------------------------------------------------------------------------------------ estimated creatinine clearance is 67.1 mL/min (by C-G formula based on Cr of 0.57). ------------------------------------------------------------------------------------------------------------------ No results for input(s): HGBA1C in the last 72 hours. ------------------------------------------------------------------------------------------------------------------ No results for input(s): CHOL, HDL, LDLCALC, TRIG, CHOLHDL, LDLDIRECT in the last 72 hours. ------------------------------------------------------------------------------------------------------------------ No results for input(s): TSH, T4TOTAL, T3FREE, THYROIDAB in the last 72 hours.  Invalid input(s):  FREET3 ------------------------------------------------------------------------------------------------------------------ No results for input(s): VITAMINB12, FOLATE, FERRITIN, TIBC, IRON, RETICCTPCT in the last 72 hours.  Coagulation profile No results for input(s): INR, PROTIME in the last 168 hours.   Recent Labs  03/18/15 2015  DDIMER 0.99*    Cardiac Enzymes  Recent Labs Lab 03/18/15 2015 03/19/15 0220 03/19/15 0743  TROPONINI <0.03 <0.03 <0.03   ------------------------------------------------------------------------------------------------------------------ Invalid input(s): POCBNP   CBG: No results for input(s): GLUCAP in the last 168 hours.     Studies: Dg Chest 2 View  03/18/2015   CLINICAL DATA:  Cough, shortness of breath, chest congestion, fever for the past 7 days. Followup pneumonia.  EXAM: CHEST  2 VIEW  COMPARISON:  03/14/2015.  FINDINGS: Minimal increase in patchy airspace opacity at the right lung base on the frontal view. This appears slightly less prominent on the lateral view and is currently seen below the major fissure. Clear left lung. The lungs remain mildly hyperexpanded. Normal sized heart. Stable mild scoliosis.  IMPRESSION: 1. Little change in anterior right lower lobe pneumonia. 2. Stable mild changes of COPD.   Electronically Signed   By: Claudie Revering M.D.   On: 03/18/2015 17:30   Ct Angio Chest Pe W/cm &/or Wo Cm  03/18/2015   CLINICAL DATA:  64 year old female with fever and nonproductive cough.  EXAM: CT ANGIOGRAPHY CHEST WITH CONTRAST  TECHNIQUE: Multidetector CT imaging of the chest was performed using the standard protocol during bolus administration of intravenous contrast. Multiplanar CT image reconstructions and MIPs were obtained to evaluate the vascular anatomy.  CONTRAST:  139m OMNIPAQUE IOHEXOL 350 MG/ML SOLN  COMPARISON:  Radiograph dated 03/18/2015  FINDINGS: Evaluation of this exam is limited due to respiratory motion artifact.   There are patchy areas of ground-glass airspace opacity predominantly involving the right lower and right middle lobe with smaller areas involving the upper lobes bilaterally most compatible with multifocal pneumonia. Clinical correlation and follow-up to resolution is recommended. There is mild emphysematous changes of the lungs. There is no pleural effusion. The central airways are patent.  The visualized thoracic aorta appear unremarkable. No CT evidence of pulmonary embolism. There is no cardiomegaly or pericardial effusion. There is right hilar adenopathy. The visualized esophagus and thyroid gland appear unremarkable. There is no axillary adenopathy. The thoracic wall soft tissues are unremarkable. There is degenerative changes of the spine. No acute fracture. A small focal area of sclerosis along the anterior aspect of the T8 along the superior endplate is likely related to chronic changes. The visualized upper abdomen appear unremarkable.  Review of the MIP images confirms the above findings.  IMPRESSION: No CT evidence of pulmonary embolism.  Multifocal pneumonia. Clinical correlation and follow-up to resolution recommended.   Electronically Signed   By: AAnner CreteM.D.   On: 03/18/2015 22:52      No results found for: HGBA1C Lab Results  Component Value Date   CREATININE 0.57 03/20/2015       Scheduled Meds: . aspirin EC  81 mg Oral Daily  . enoxaparin (LOVENOX) injection  40 mg Subcutaneous QHS  . feeding supplement (ENSURE ENLIVE)  237 mL Oral BID BM  . fluconazole  100 mg Oral Daily  . piperacillin-tazobactam (ZOSYN)  IV  3.375 g Intravenous Q8H  . saccharomyces boulardii  250 mg Oral BID  . simvastatin  40 mg Oral Daily  . vancomycin  750 mg Intravenous Q12H   Continuous Infusions: . sodium chloride 100 mL/hr at 03/19/15 1045    Active Problems:   Recurrent pneumonia   Sepsis   Syncope    Time spent: 45 minutes   [facility] Pager  [phone] If 7PM-7AM, please contact night-coverage at [email or web address], password [email or web address] [place]9/12/2014, 12:14 PM  LOS: 2 days

## 2015-03-20 NOTE — Care Management Note (Signed)
Case Management Note  Patient Details  Name: [name] [name] MRN: [id number] Date of Birth: 09/13/1950  Subjective/Objective:63 y/o f admitted w/PNA. From home. PT-no f/u.                    Action/Plan:d/c plan home.   Expected Discharge Date:                  Expected Discharge Plan:  Home/Self Care  In-House Referral:     Discharge planning Services  CM Consult  Post Acute Care Choice:    Choice offered to:     DME Arranged:    DME Agency:     HH Arranged:    HH Agency:     Status of Service:  In process, will continue to follow  Medicare Important Message Given:    Date Medicare IM Given:    Medicare IM give by:    Date Additional Medicare IM Given:    Additional Medicare Important Message give by:     If discussed at [place] of Stay Meetings, dates discussed:    Additional Comments:  Dessa Phi, RN 03/20/2015, 6:21 PM

## 2015-03-20 NOTE — Progress Notes (Signed)
VASCULAR LAB PRELIMINARY  PRELIMINARY  PRELIMINARY  PRELIMINARY  Carotid duplex  completed.    Preliminary report:  Bilateral:  1-39% ICA stenosis.  Vertebral artery flow is antegrade.      [name] [name], RVT 03/20/2015, 12:29 PM

## 2015-03-20 NOTE — Progress Notes (Signed)
Initial Nutrition Assessment  DOCUMENTATION CODES:   Not applicable  INTERVENTION:  - Continue Ensure Enlive BID, each supplement provides 350 kcal and 20 grams of protein - RD will continue to monitor for needs  NUTRITION DIAGNOSIS:   Inadequate oral intake related to poor appetite as evidenced by per patient/family report, meal completion < 50%.  GOAL:   Patient will meet greater than or equal to 90% of their needs  MONITOR:   PO intake, Supplement acceptance, Weight trends, Labs, I & O's  REASON FOR ASSESSMENT:   Malnutrition Screening Tool  ASSESSMENT:   1 week hx of fever,cough non-productive, and right lower back pain. On 31 of August was diagnosed with CAP and treated with Z-pack. Patient continued to have occasional fevers poor appetite. Today she got up to the bathroom and felt hot shortly after she had a syncopal episode. Patient has 3 day hx of upper back pain as well since she woke up. It hurts worse when she tries to look up at the sky or sit up. Denies any numbness in her hands. Patient presented to emergency department she was found to have fever up to 100.1 white blood cell count 16.6 chest x-ray sure he anterior right lower lobe pneumonia and stable mild changes of COPD.  Pt seen for MST. BMI indicates normal weight status. Pt reports she ate 1/4 omelet, 1/2 piece of toast, and had cranberry juice for breakfast this AM. She states she typically has a good appetite and eats well and that she is very active at baseline.  She reports appetite has been decreased x2 days and is one of the reasons she came to the hospital. She denies abdominal pain or nausea even after intakes. She drank 25-50% of Ensure yesterday and states she is planning to drink some later this AM.  She reports UBW of 133-135 lbs which is consistent with current weight. No muscle or fat wasting noted.  Medications reviewed. Labs reviewed; K: 3.3 mmol/L, Cl: 98 mmol/L, Ca: 8.7 mg/dL.    Diet  Order:  Diet regular Room service appropriate?: Yes; Fluid consistency:: Thin  Skin:  Reviewed, no issues  Last BM:  PTA  Height:   Ht Readings from Last 1 Encounters:  03/18/15 5\' 7"  (1.702 m)    Weight:   Wt Readings from Last 1 Encounters:  03/18/15 133 lb 9.6 oz (60.6 kg)    Ideal Body Weight:  61.36 kg  BMI:  Body mass index is 20.92 kg/(m^2).  Estimated Nutritional Needs:   Kcal:  1500-1700  Protein:  60-70 grams  Fluid:  2-2.2 L/day  EDUCATION NEEDS:   No education needs identified at this time     Jarome Matin, RD, LDN Inpatient Clinical Dietitian Pager # [phone] After hours/weekend pager # [phone]

## 2015-03-20 NOTE — Evaluation (Signed)
Physical Therapy One Time Evaluation Patient Details Name: [name] [name] MRN: [id number] DOB: 10-15-1950 Today's Date: 03/20/2015   History of Present Illness  Pt is a 64 year old female recently diagnosed with CAP and admitted with Sepsis secondary to multifocal pneumonia. PMHx osteopenia, R hip pinning,   Clinical Impression  Patient evaluated by Physical Therapy with no further acute PT needs identified. All education has been completed and the patient has no further questions.  Pt had no issues with mobility and denied dizziness during gait.  Pt reports neck and upper back pain better this afternoon.  Pt able to perform all neck ROM, scapular protraction, retraction, elevation and depression without difficulty or pain.  Due to pt description of neck stiffness earlier and pain in "rectangle near shoulder blades" as well as palpation of muscle knots (especially R side), believe likely muscular involvement mainly trapezius and rhomboids. Recommended massage therapy if pain/tightness persists as well as importance of proper posture (especially sitting and in supine with pillow positioning).  See below for any follow-up Physical Therapy or equipment needs. PT is signing off. Thank you for this referral.     Follow Up Recommendations No PT follow up    Equipment Recommendations  None recommended by PT    Recommendations for Other Services       Precautions / Restrictions Precautions Precautions: Fall Precaution Comments: syncope episode prior to admission      Mobility  Bed Mobility Overal bed mobility: Modified Independent                Transfers Overall transfer level: Modified independent                  Ambulation/Gait Ambulation/Gait assistance: Modified independent (Device/Increase time) Ambulation Distance (Feet): 1000 Feet Assistive device: None Gait Pattern/deviations: WFL(Within Functional Limits)     General Gait Details: WNL, denies  dizziness  Stairs            Wheelchair Mobility    Modified Rankin (Stroke Patients Only)       Balance                                             Pertinent Vitals/Pain Pain Assessment: No/denies pain    Home Living Family/patient expects to be discharged to:: Private residence Living Arrangements: Alone   Type of Home: House       Home Layout: One level Home Equipment: Crutches      Prior Function Level of Independence: Independent               Hand Dominance        Extremity/Trunk Assessment   Upper Extremity Assessment: Overall WFL for tasks assessed           Lower Extremity Assessment: Overall WFL for tasks assessed      Cervical / Trunk Assessment: Normal  Communication   Communication: No difficulties  Cognition Arousal/Alertness: Awake/alert Behavior During Therapy: WFL for tasks assessed/performed Overall Cognitive Status: Within Functional Limits for tasks assessed                      General Comments      Exercises        Assessment/Plan    PT Assessment Patent does not need any further PT services  PT Diagnosis     PT Problem List  PT Treatment Interventions     PT Goals (Current goals can be found in the Care Plan section) Acute Rehab PT Goals PT Goal Formulation: All assessment and education complete, DC therapy    Frequency     Barriers to discharge        Co-evaluation               End of Session Equipment Utilized During Treatment: Gait belt Activity Tolerance: Patient tolerated treatment well Patient left:  (bathroom, aware to use pull cord) Nurse Communication:  (secretary aware pt in bathroom and to use pull cord)         Time: 9774-1423 PT Time Calculation (min) (ACUTE ONLY): 32 min   Charges:   PT Evaluation $Initial PT Evaluation Tier I: 1 Procedure     PT G Codes:        Myna Freimark,KATHrine E 03/20/2015, 3:54 PM Carmelia Bake, PT,  DPT 03/20/2015 Pager: [phone]

## 2015-03-21 DIAGNOSIS — I951 Orthostatic hypotension: Secondary | ICD-10-CM | POA: Insufficient documentation

## 2015-03-21 DIAGNOSIS — J159 Unspecified bacterial pneumonia: Secondary | ICD-10-CM | POA: Diagnosis present

## 2015-03-21 DIAGNOSIS — E46 Unspecified protein-calorie malnutrition: Secondary | ICD-10-CM

## 2015-03-21 DIAGNOSIS — A419 Sepsis, unspecified organism: Principal | ICD-10-CM

## 2015-03-21 LAB — CBC
HEMATOCRIT: 33.2 % — AB (ref 36.0–46.0)
HEMOGLOBIN: 11 g/dL — AB (ref 12.0–15.0)
MCH: 32.2 pg (ref 26.0–34.0)
MCHC: 33.1 g/dL (ref 30.0–36.0)
MCV: 97.1 fL (ref 78.0–100.0)
Platelets: 492 10*3/uL — ABNORMAL HIGH (ref 150–400)
RBC: 3.42 MIL/uL — ABNORMAL LOW (ref 3.87–5.11)
RDW: 12.4 % (ref 11.5–15.5)
WBC: 15.7 10*3/uL — AB (ref 4.0–10.5)

## 2015-03-21 LAB — COMPREHENSIVE METABOLIC PANEL
ALK PHOS: 142 U/L — AB (ref 38–126)
ALT: 17 U/L (ref 14–54)
ANION GAP: 10 (ref 5–15)
AST: 31 U/L (ref 15–41)
Albumin: 2.7 g/dL — ABNORMAL LOW (ref 3.5–5.0)
BILIRUBIN TOTAL: 0.5 mg/dL (ref 0.3–1.2)
BUN: 6 mg/dL (ref 6–20)
CALCIUM: 8.8 mg/dL — AB (ref 8.9–10.3)
CO2: 27 mmol/L (ref 22–32)
Chloride: 98 mmol/L — ABNORMAL LOW (ref 101–111)
Creatinine, Ser: 0.66 mg/dL (ref 0.44–1.00)
GFR calc Af Amer: >60 mL/min (ref >60)
Glucose, Bld: 121 mg/dL — ABNORMAL HIGH (ref 65–99)
POTASSIUM: 3.5 mmol/L (ref 3.5–5.1)
Sodium: 135 mmol/L (ref 135–145)
TOTAL PROTEIN: 6.8 g/dL (ref 6.5–8.1)

## 2015-03-21 LAB — VANCOMYCIN, TROUGH: VANCOMYCIN TR: 8 ug/mL — AB (ref 10.0–20.0)

## 2015-03-21 MED ORDER — ENSURE ENLIVE PO LIQD: 237.0000 mL | Freq: Two times a day (BID) | ORAL | Status: DC

## 2015-03-21 MED ORDER — LEVOFLOXACIN 750 MG PO TABS: 750.0000 mg | ORAL_TABLET | Freq: Every day | ORAL | Status: AC

## 2015-03-21 MED ORDER — SACCHAROMYCES BOULARDII 250 MG PO CAPS: 250.0000 mg | ORAL_CAPSULE | Freq: Two times a day (BID) | ORAL | Status: DC

## 2015-03-21 NOTE — Progress Notes (Signed)
Waiting for ride IV d/c'd no s/s infection

## 2015-03-21 NOTE — Discharge Summary (Signed)
Physician Discharge Summary  [name] Hanel OZH:[id number] DOB: [birth year] DOA: 03/18/2015  PCP: Criselda Peaches, MD  Admit date: 03/18/2015 Discharge date: 03/21/2015  Time spent: >30 minutes  Recommendations for Outpatient Follow-up:  #1 Discharge home with  outpatient PCP follow-up in 1 week #2 Patient with complete total seven-day course of Levaquin on 9/11. Patient will need a follow-up chest x-ray in 4 weeks to ensure resolution.  Discharge Diagnoses:  Principal problem Sepsis secondary to bacterial lobar pneumonia  Active Problems:   Hyperlipidemia   Orthostatic syncope   Discharge Condition:Fair  Diet recommendation: Regular  Filed Weights   03/18/15 2108  Weight: 60.6 kg (133 lb 9.6 oz)    History of present illness:  Please refer to admission H&P for details, in brief, 64 year old female presented to the ED with one week history of fever with nonproductive cough and pain over her shoulder blades. On 8/31 patient was diagnosed with cognitive by pneumonia and treated with a course of Z-Pak .however patient continued to have low-grade fever and poor appetite. On the day of admission she got up to go to the bathroom, felt hot and shortly thereafter had a syncopal episode. Patient also complaining of dull pain in her shoulder blade. In the ED patient was found to have a fever of 100.36F with WBC of 16.6 and chest x-ray showing right lower lobe pneumonia . Patient met criteria for sepsis and admitted to medical floor.   Hospital Course:  Sepsis secondary to multifocal bacterial pneumonia Patient started on empiric vancomycin and Zosyn given sepsis and possible failed outpatient antibiotic therapy. She remains afebrile for the past 24 hours . Sepsis has resolved.. Cough and back pain improving. Still has leukocytosis. Patient clinically improved.Blood cultures, urine for strep antigen and Legionella negative. CT angiogram of the chest on admission negative for PE. Patient will be  discharged on oral Levaquin to complete a total 7 day course of antibiotics. Antitussives prescribed.  Upper back pain Appears to be secondary to pleurisy. Recommend to continue Tylenol when necessary  Orthostatic syncope.  Improved with IV fluids. A carotid Dopplers unremarkable.  Diarrhea Possibly associated with infection class antibiotics. C. difficile negative. resolved.  Protein calorie malnutrition Habits supplement  Hyperlipidemia Continue statin   Diet: Regular  Procedures: CT angiogram of the chest   Consultations:  None  Discharge Exam: Filed Vitals:   03/21/15 0610  BP: 121/68  Pulse: 97  Temp: 99.5 F (37.5 C)  Resp: 20    General: Elderly female in no acute distress HEENT: No pallor, moist oral mucosa, supple Chest: Clear to auscultation bilaterally, no added sounds CVS: Normal S1 and S2, no murmurs rub or gallop GI: Soft, nondistended, nontender, bowel sounds present Musculoskeletal: Warm, no edema CNS: Alert and oriented   Discharge Instructions    Current Discharge Medication List    START taking these medications   Details  feeding supplement, ENSURE ENLIVE, (ENSURE ENLIVE) LIQD Take 237 mLs by mouth 2 (two) times daily between meals. Qty: 237 mL, Refills: 12    levofloxacin (LEVAQUIN) 750 MG tablet Take 1 tablet (750 mg total) by mouth daily. Qty: 5 tablet, Refills: 0    saccharomyces boulardii (FLORASTOR) 250 MG capsule Take 1 capsule (250 mg total) by mouth 2 (two) times daily. Qty: 20 capsule, Refills: 0      CONTINUE these medications which have NOT CHANGED   Details  acetaminophen (TYLENOL) 500 MG tablet Take 1,000 mg by mouth 3 (three) times daily as needed for moderate pain  or fever.    aspirin 81 MG tablet Take 81 mg by mouth daily.    simvastatin (ZOCOR) 40 MG tablet Take 40 mg by mouth daily.       STOP taking these medications     alendronate (FOSAMAX) 70 MG tablet      aspirin EC 325 MG tablet      Calcium  Carbonate-Vitamin D (CALCIUM + D PO)        Allergies  Allergen Reactions  . Cephalexin Rash   Follow-up Information    Follow up with GREEN, EDWIN JAY, MD. Schedule an appointment as soon as possible for a visit in 1 week.   Specialty:  Internal Medicine   Contact information:   [address] [place]  [phone]        The results of significant diagnostics from this hospitalization (including imaging, microbiology, ancillary and laboratory) are listed below for reference.    Significant Diagnostic Studies: Dg Chest 2 View  03/18/2015   CLINICAL DATA:  Cough, shortness of breath, chest congestion, fever for the past 7 days. Followup pneumonia.  EXAM: CHEST  2 VIEW  COMPARISON:  03/14/2015.  FINDINGS: Minimal increase in patchy airspace opacity at the right lung base on the frontal view. This appears slightly less prominent on the lateral view and is currently seen below the major fissure. Clear left lung. The lungs remain mildly hyperexpanded. Normal sized heart. Stable mild scoliosis.  IMPRESSION: 1. Little change in anterior right lower lobe pneumonia. 2. Stable mild changes of COPD.   Electronically Signed   By: Claudie Revering M.D.   On: 03/18/2015 17:30   Dg Chest 2 View  03/14/2015   CLINICAL DATA:  Pneumonia.  High fever last night.  Cough.  EXAM: CHEST - 2 VIEW  COMPARISON:  One-view chest 11/14/2013  FINDINGS: The heart size is normal. Emphysematous changes are again noted. New airspace opacity is evident inferiorly in the right middle lobe. No other focal airspace consolidation is present. There is no edema or effusion. Mild rightward curvature is present in the thoracic spine. Endplate degenerative changes are noted. Atherosclerotic calcifications are again seen within the thoracic aorta. The visualized soft tissues and bony thorax are unremarkable otherwise.  IMPRESSION: 1. New right middle lobe pneumonia. 2. Emphysema.   Electronically Signed   By:  San Morelle M.D.   On: 03/14/2015 13:32   Ct Angio Chest Pe W/cm &/or Wo Cm  03/18/2015   CLINICAL DATA:  64 year old female with fever and nonproductive cough.  EXAM: CT ANGIOGRAPHY CHEST WITH CONTRAST  TECHNIQUE: Multidetector CT imaging of the chest was performed using the standard protocol during bolus administration of intravenous contrast. Multiplanar CT image reconstructions and MIPs were obtained to evaluate the vascular anatomy.  CONTRAST:  175m OMNIPAQUE IOHEXOL 350 MG/ML SOLN  COMPARISON:  Radiograph dated 03/18/2015  FINDINGS: Evaluation of this exam is limited due to respiratory motion artifact.  There are patchy areas of ground-glass airspace opacity predominantly involving the right lower and right middle lobe with smaller areas involving the upper lobes bilaterally most compatible with multifocal pneumonia. Clinical correlation and follow-up to resolution is recommended. There is mild emphysematous changes of the lungs. There is no pleural effusion. The central airways are patent.  The visualized thoracic aorta appear unremarkable. No CT evidence of pulmonary embolism. There is no cardiomegaly or pericardial effusion. There is right hilar adenopathy. The visualized esophagus and thyroid gland appear unremarkable. There is no axillary adenopathy. The  thoracic wall soft tissues are unremarkable. There is degenerative changes of the spine. No acute fracture. A small focal area of sclerosis along the anterior aspect of the T8 along the superior endplate is likely related to chronic changes. The visualized upper abdomen appear unremarkable.  Review of the MIP images confirms the above findings.  IMPRESSION: No CT evidence of pulmonary embolism.  Multifocal pneumonia. Clinical correlation and follow-up to resolution recommended.   Electronically Signed   By: Anner Crete M.D.   On: 03/18/2015 22:52    Microbiology: Recent Results (from the past 240 hour(s))  Blood culture (routine x 2)      Status: None (Preliminary result)   Collection Time: 03/18/15  7:39 PM  Result Value Ref Range Status   Specimen Description BLOOD LEFT ARM  Final   Special Requests BOTTLES DRAWN AEROBIC AND ANAEROBIC 5CC  Final   Culture   Final    NO GROWTH 1 DAY Performed at [facility]    Report Status PENDING  Incomplete  Blood culture (routine x 2)     Status: None (Preliminary result)   Collection Time: 03/18/15  8:27 PM  Result Value Ref Range Status   Specimen Description BLOOD RIGHT FOREARM  Final   Special Requests BOTTLES DRAWN AEROBIC AND ANAEROBIC 5ML  Final   Culture   Final    NO GROWTH 1 DAY Performed at [facility]    Report Status PENDING  Incomplete  C difficile quick scan w PCR reflex     Status: None   Collection Time: 03/20/15  6:58 PM  Result Value Ref Range Status   C Diff antigen NEGATIVE NEGATIVE Final   C Diff toxin NEGATIVE NEGATIVE Final   C Diff interpretation Negative for toxigenic C. difficile  Final     Labs: Basic Metabolic Panel:  Recent Labs Lab 03/18/15 1659 03/19/15 0220 03/20/15 0423 03/21/15 0540  NA 136 137 135 135  K 3.4* 3.6 3.3* 3.5  CL 98* 100* 98* 98*  CO2 '28 29 27 27  ' GLUCOSE 134* 123* 120* 121*  BUN '7 7 8 6  ' CREATININE 0.59 0.66 0.57 0.66  CALCIUM 9.4 9.0 8.7* 8.8*   Liver Function Tests:  Recent Labs Lab 03/19/15 0220 03/20/15 0423 03/21/15 0540  AST '22 23 31  ' ALT 13* 14 17  ALKPHOS 119 118 142*  BILITOT 0.7 0.6 0.5  PROT 7.5 6.7 6.8  ALBUMIN 3.2* 2.9* 2.7*   No results for input(s): LIPASE, AMYLASE in the last 168 hours. No results for input(s): AMMONIA in the last 168 hours. CBC:  Recent Labs Lab 03/18/15 1659 03/19/15 0220 03/20/15 0423 03/21/15 0540  WBC 16.4* 15.3* 15.4* 15.7*  NEUTROABS 13.9* 11.7*  --   --   HGB 12.5 11.9* 11.4* 11.0*  HCT 38.0 37.2 35.1* 33.2*  MCV 96.4 97.1 97.5 97.1  PLT 547* 526* 504* 492*   Cardiac Enzymes:  Recent Labs Lab 03/18/15 2015 03/19/15 0220  03/19/15 0743  TROPONINI <0.03 <0.03 <0.03   BNP: BNP (last 3 results) No results for input(s): BNP in the last 8760 hours.  ProBNP (last 3 results) No results for input(s): PROBNP in the last 8760 hours.  CBG: No results for input(s): GLUCAP in the last 168 hours.     SignedLouellen Molder  [facility] 03/21/2015, 8:32 AM

## 2015-03-21 NOTE — Discharge Instructions (Signed)

## 2015-03-21 NOTE — Care Management Note (Signed)
Case Management Note  Patient Details  Name: [name] [name] MRN: [id number] Date of Birth: Jun 19, 1951  Subjective/Objective:                    Action/Plan:d/c home no needs or orders.   Expected Discharge Date:                  Expected Discharge Plan:  Home/Self Care  In-House Referral:     Discharge planning Services  CM Consult  Post Acute Care Choice:    Choice offered to:     DME Arranged:    DME Agency:     HH Arranged:    Guin Agency:     Status of Service:  Completed, signed off  Medicare Important Message Given:    Date Medicare IM Given:    Medicare IM give by:    Date Additional Medicare IM Given:    Additional Medicare Important Message give by:     If discussed at [place] of Stay Meetings, dates discussed:    Additional Comments:  Dessa Phi, RN 03/21/2015, 10:09 AM

## 2015-03-21 NOTE — Progress Notes (Signed)
OT Cancellation Note  Patient Details Name: [name] [name] MRN: [id number] DOB: 1951-06-18   Cancelled Treatment:    Reason Eval/Treat Not Completed: OT screened, no needs identified, will sign off. Per pt, she has no difficulty with any of her ADL at this time. Has been up to commode without difficulty per her report. She states she has higher commodes with a grab bar at home and grab bar on her tub. She declines need for OT eval.   Jules Schick  [phone] 03/21/2015, 9:02 AM

## 2015-03-22 ENCOUNTER — Encounter: Payer: Self-pay | Admitting: Internal Medicine

## 2015-03-22 ENCOUNTER — Ambulatory Visit
Admission: RE | Admit: 2015-03-22 | Discharge: 2015-03-22 | Disposition: A | Discharge status: Home / Self Care | Payer: No Typology Code available for payment source | Source: Ambulatory Visit | Attending: Internal Medicine | Admitting: Internal Medicine

## 2015-03-22 ENCOUNTER — Ambulatory Visit (INDEPENDENT_AMBULATORY_CARE_PROVIDER_SITE_OTHER): Payer: No Typology Code available for payment source | Admitting: Internal Medicine

## 2015-03-22 ENCOUNTER — Other Ambulatory Visit (INDEPENDENT_AMBULATORY_CARE_PROVIDER_SITE_OTHER): Payer: No Typology Code available for payment source

## 2015-03-22 ENCOUNTER — Telehealth: Payer: Self-pay | Admitting: Pulmonary Disease

## 2015-03-22 ENCOUNTER — Other Ambulatory Visit: Payer: Self-pay | Admitting: Internal Medicine

## 2015-03-22 DIAGNOSIS — J189 Pneumonia, unspecified organism: Secondary | ICD-10-CM

## 2015-03-22 DIAGNOSIS — J159 Unspecified bacterial pneumonia: Secondary | ICD-10-CM | POA: Diagnosis not present

## 2015-03-22 LAB — CBC WITH DIFFERENTIAL/PLATELET
BASOS PCT: 0.2 % (ref 0.0–3.0)
Basophils Absolute: 0 10*3/uL (ref 0.0–0.1)
EOS PCT: 3.3 % (ref 0.0–5.0)
Eosinophils Absolute: 0.6 10*3/uL (ref 0.0–0.7)
HCT: 35.4 % — ABNORMAL LOW (ref 36.0–46.0)
Hemoglobin: 11.9 g/dL — ABNORMAL LOW (ref 12.0–15.0)
LYMPHS ABS: 1.5 10*3/uL (ref 0.7–4.0)
Lymphocytes Relative: 8.2 % — ABNORMAL LOW (ref 12.0–46.0)
MCHC: 33.6 g/dL (ref 30.0–36.0)
MCV: 94.4 fl (ref 78.0–100.0)
MONO ABS: 1.4 10*3/uL — AB (ref 0.1–1.0)
MONOS PCT: 7.3 % (ref 3.0–12.0)
NEUTROS ABS: 15 10*3/uL — AB (ref 1.4–7.7)
NEUTROS PCT: 81 % — AB (ref 43.0–77.0)
Platelets: 641 10*3/uL — ABNORMAL HIGH (ref 150.0–400.0)
RBC: 3.75 Mil/uL — ABNORMAL LOW (ref 3.87–5.11)
RDW: 12.8 % (ref 11.5–15.5)
WBC: 18.5 10*3/uL (ref 4.0–10.5)

## 2015-03-22 NOTE — Patient Instructions (Addendum)
Please remember to go to the lab department downstairs for your tests - we will call you with the results when they are available.  Try advil 200 mg three with meals as needed for fever or pain (tylenol can be used to supplement if needed)   Return first of the week for follow up cxr

## 2015-03-22 NOTE — Telephone Encounter (Signed)
Called by [facility] lab with critical WBC = 18.5. Patient is completing a 7 day course of Levaquin. Called Idania Zenk at [phone]. No answer. Message left on answering machine that her WBC was elevated. Told patient that if she had fever > 101.0 F, increased SOB, or otherwise felt she was worse than earlier today when she was seen in the office by Dr. Melvyn Novas to go the the Emergency Department for evaluation. Also left eLink phone number in the event that she had any questions for me tonight. If she felt the same or better, she could call the [facility] in the AM for further instructions.

## 2015-03-22 NOTE — Progress Notes (Addendum)
Subjective:    Patient ID: [name] [name], female    DOB: 1951/05/09,    MRN: [id number]  HPI  1 yowf never smoker works at [employer] able hike at [employer] abruptly ill rx zpak Tmax 101.9  then 6 days later admitted:  Admit date: 03/18/2015 Discharge date: 03/21/2015  Recommendations for Outpatient Follow-up:  #1 Discharge home with outpatient PCP follow-up in 1 week #2 Patient with complete total seven-day course of Levaquin on 9/11. Patient will need a follow-up chest x-ray in 4 weeks to ensure resolution.  Discharge Diagnoses:  Principal problem Sepsis secondary to bacterial lobar pneumonia  Active Problems:  Hyperlipidemia  Orthostatic syncope   Discharge Condition:Fair  Diet recommendation: Regular  Filed Weights   03/18/15 2108  Weight: 60.6 kg (133 lb 9.6 oz)    History of present illness:  Please refer to admission H&P for details, in brief, 64 year old female presented to the ED with one week history of fever with nonproductive cough and pain over her shoulder blades. On 8/31 patient was diagnosed with cognitive by pneumonia and treated with a course of Z-Pak .however patient continued to have low-grade fever and poor appetite. On the day of admission she got up to go to the bathroom, felt hot and shortly thereafter had a syncopal episode. Patient also complaining of dull pain in her shoulder blade. In the ED patient was found to have a fever of 100.92F with WBC of 16.6 and chest x-ray showing right lower lobe pneumonia . Patient met criteria for sepsis and admitted to medical floor.   Hospital Course:  Sepsis secondary to multifocal bacterial pneumonia Patient started on empiric vancomycin and Zosyn given sepsis and possible failed outpatient antibiotic therapy. She remains afebrile for the past 24 hours . Sepsis has resolved.. Cough and back pain improving. Still has leukocytosis. Patient clinically improved.Blood cultures, urine for strep antigen  and Legionella negative. CT angiogram of the chest on admission negative for PE. Patient will be discharged on oral Levaquin to complete a total 7 day course of antibiotics. Antitussives prescribed.  Upper back pain Appears to be secondary to pleurisy. Recommend to continue Tylenol when necessary  Orthostatic syncope.  Improved with IV fluids. A carotid Dopplers unremarkable.  Diarrhea Possibly associated with infection class antibiotics. C. difficile negative. resolved.  Protein calorie malnutrition Habits supplement  Hyperlipidemia Continue statin       03/22/2015 new pt consultation/ Wert   Chief Complaint  Patient presents with  . Pulmonary Consult    Referred by Dr. Zada Girt.  Pt states that she was dxed with PNA 03/14/15. She c/o low grade fever and cough-non prod.     Tmax 101.9 was the day after Zmax was started  Cough better >  Scant yellow mucus started  New R cp started 03/21/15 / has not tried nsaids Mild nausea since 9/4 poor appetite, no vomiting  No obvious other patterns in day to day or daytime variabilty or assocchest tightness, subjective wheeze overt sinus or hb symptoms. No unusual exp hx or h/o childhood pna/ asthma or knowledge of premature birth.  Sleeping ok without nocturnal  or early am exacerbation  of respiratory  c/o's or need for noct saba. Also denies any obvious fluctuation of symptoms with weather or environmental changes or other aggravating or alleviating factors except as outlined above   Current Medications, Allergies, Complete Past Medical History, Past Surgical History, Family History, and Social History were reviewed in [employer] record.  Review of Systems  Constitutional: Positive for appetite change. Negative for fever, chills and unexpected weight change.  HENT: Positive for sore throat. Negative for congestion, dental problem, ear pain, nosebleeds, postnasal drip, rhinorrhea, sinus pressure,  sneezing, trouble swallowing and voice change.   Eyes: Negative for visual disturbance.  Respiratory: Positive for cough. Negative for choking and shortness of breath.   Cardiovascular: Negative for chest pain and leg swelling.  Gastrointestinal: Negative for vomiting, abdominal pain and diarrhea.  Genitourinary: Negative for difficulty urinating.  Musculoskeletal: Negative for arthralgias.  Skin: Negative for rash.  Neurological: Negative for tremors, syncope and headaches.  Hematological: Does not bruise/bleed easily.       Objective:   Physical Exam  amb healthy appearing thin wf nad - she does not appear toxic at all    Wt Readings from Last 3 Encounters:  03/22/15 133 lb 6.4 oz (60.51 kg)  03/18/15 133 lb 9.6 oz (60.6 kg)  04/12/14 135 lb (61.236 kg)    Vital signs reviewed   HEENT: nl dentition, turbinates, and orophanx. Nl external ear canals without cough reflex   NECK :  without JVD/Nodes/TM/ nl carotid upstrokes bilaterally   LUNGS: no acc muscle use, decreased bs R base, no rub, no classic bronchial changes or egophony    CV:  RRR  no s3 or murmur or increase in P2, no edema   ABD:  soft and nontender with nl excursion in the supine position. No bruits or organomegaly, bowel sounds nl  MS:  warm without deformities, calf tenderness, cyanosis or clubbing  SKIN: warm and dry without lesions    NEURO:  alert, approp, no deficits      I personally reviewed images and agree with radiology impression as follows:  CXR:  03/22/15  Worsening of right middle lobe and anterior right lower lobe pneumonia. The infiltrate demonstrated on the earlier CT in the posterior aspect of the right lower lobe is not evident today.   Labs ordered  03/22/2015 = esr/ cbc with diff      Assessment & Plan:

## 2015-03-23 LAB — SEDIMENTATION RATE: SED RATE: 113 mm/h — AB (ref 0–22)

## 2015-03-23 NOTE — Assessment & Plan Note (Signed)
-   onset of symptoms 03/13/15 rx zmax then vanc/zosyn then levaquin to complete on 03/25/15  The persistent low grade fever and R pleuritic cp are worrisome for development of a loculated pleural effusion/empyema or a resistant organism. Reviewing the hospital record does suggest that she defervesced on Zosyn and vancomycin and now she has fever on Levaquin. If she were toxic appearing at all I  would recommend she be admitted back to the hospital and be placed back on that combination.    I explained to her however that if she is developing the complication above there is nothing really we can do about it at this point but consider a CT scan with contrast to be repeated after she completes her antibiotics- should her condition worsen in the meantime she should go back to the emergency room.  Since she is not yet tried nonsteroidals she should do so and supplement these with Tylenol when necessary  Each maintenance medication was reviewed in detail including most importantly the difference between maintenance and as needed and under what circumstances the prns are to be used.  Please see instructions for details which were reviewed in writing and the patient given a copy.    Total time = 47m review case with pt/ discussion/ counseling/ giving and going over instructions (see avs)

## 2015-03-23 NOTE — Telephone Encounter (Signed)
Pt returned call  I advised that her WBC was elevated and until MW signs off on labs, no changes in her recs from [place] yesterday  She is feeling some better this am and denies having any new co's  Will forward to MW so that he is aware

## 2015-03-24 LAB — CULTURE, BLOOD (ROUTINE X 2)
Culture: NO GROWTH
Culture: NO GROWTH

## 2015-03-27 ENCOUNTER — Ambulatory Visit (INDEPENDENT_AMBULATORY_CARE_PROVIDER_SITE_OTHER): Payer: No Typology Code available for payment source | Admitting: Internal Medicine

## 2015-03-27 ENCOUNTER — Telehealth: Payer: Self-pay | Admitting: Internal Medicine

## 2015-03-27 ENCOUNTER — Encounter: Payer: Self-pay | Admitting: Internal Medicine

## 2015-03-27 ENCOUNTER — Encounter: Payer: Self-pay | Admitting: *Deleted

## 2015-03-27 ENCOUNTER — Ambulatory Visit (INDEPENDENT_AMBULATORY_CARE_PROVIDER_SITE_OTHER)
Admission: RE | Admit: 2015-03-27 | Discharge: 2015-03-27 | Disposition: A | Discharge status: Home / Self Care | Payer: No Typology Code available for payment source | Source: Ambulatory Visit | Attending: Internal Medicine | Admitting: Internal Medicine

## 2015-03-27 ENCOUNTER — Other Ambulatory Visit (INDEPENDENT_AMBULATORY_CARE_PROVIDER_SITE_OTHER): Payer: No Typology Code available for payment source

## 2015-03-27 VITALS — BP 126/84 | HR 105 | Temp 98.4°F | Ht 66.5 in | Wt 133.0 lb

## 2015-03-27 DIAGNOSIS — J159 Unspecified bacterial pneumonia: Secondary | ICD-10-CM

## 2015-03-27 DIAGNOSIS — J189 Pneumonia, unspecified organism: Secondary | ICD-10-CM | POA: Insufficient documentation

## 2015-03-27 DIAGNOSIS — J948 Other specified pleural conditions: Secondary | ICD-10-CM | POA: Diagnosis not present

## 2015-03-27 DIAGNOSIS — J918 Pleural effusion in other conditions classified elsewhere: Secondary | ICD-10-CM

## 2015-03-27 LAB — CBC WITH DIFFERENTIAL/PLATELET
BASOS PCT: 0.5 % (ref 0.0–3.0)
Basophils Absolute: 0.1 10*3/uL (ref 0.0–0.1)
EOS PCT: 3.8 % (ref 0.0–5.0)
Eosinophils Absolute: 0.8 10*3/uL — ABNORMAL HIGH (ref 0.0–0.7)
HEMATOCRIT: 34.9 % — AB (ref 36.0–46.0)
HEMOGLOBIN: 11.5 g/dL — AB (ref 12.0–15.0)
Lymphs Abs: 1.1 10*3/uL (ref 0.7–4.0)
MCHC: 33 g/dL (ref 30.0–36.0)
MCV: 94.6 fl (ref 78.0–100.0)
MONOS PCT: 3.9 % (ref 3.0–12.0)
Monocytes Absolute: 0.9 10*3/uL (ref 0.1–1.0)
Neutro Abs: 19.2 10*3/uL — ABNORMAL HIGH (ref 1.4–7.7)
Neutrophils Relative %: 86.8 % — ABNORMAL HIGH (ref 43.0–77.0)
Platelets: 707 10*3/uL — ABNORMAL HIGH (ref 150.0–400.0)
RBC: 3.69 Mil/uL — AB (ref 3.87–5.11)
RDW: 12.7 % (ref 11.5–15.5)

## 2015-03-27 LAB — SEDIMENTATION RATE: SED RATE: 115 mm/h — AB (ref 0–22)

## 2015-03-27 MED ORDER — TRAMADOL HCL 50 MG PO TABS: ORAL_TABLET | ORAL | Status: DC

## 2015-03-27 NOTE — Assessment & Plan Note (Addendum)
A CT scan of the chest is the next then consider thoracentesis under ultrasound  In the meantime she should continue the Advil when necessary for pleuritic pain  I had an extended discussion with the patient reviewing all relevant studies completed to date and  lasting 15 to 20 minutes of a 25 minute visit    Each maintenance medication was reviewed in detail including most importantly the difference between maintenance and prns and under what circumstances the prns are to be triggered using an action plan format that is not reflected in the computer generated alphabetically organized AVS.    Please see instructions for details which were reviewed in writing and the patient given a copy highlighting the part that I personally wrote and discussed at today's ov.

## 2015-03-27 NOTE — Assessment & Plan Note (Signed)
-   onset of symptoms 03/13/15 rx zmax then vanc/zosyn then levaquin 9/7 -  03/25/15  She has had enough antibiotics at this point and unfortunately looks to be evolving a parapneumonic effusion or even early empyema on the right

## 2015-03-27 NOTE — Progress Notes (Signed)
Quick Note:  Spoke with pt and notified of results per Dr. Wert. Pt verbalized understanding and denied any questions.  ______ 

## 2015-03-27 NOTE — Telephone Encounter (Signed)
[place]

## 2015-03-27 NOTE — Telephone Encounter (Signed)
Continue advil 200mg  x 3 with each meal  Schedule CT with contrast dx CAP r/o empyema for 9/14

## 2015-03-27 NOTE — Telephone Encounter (Signed)
Patient notified. CT ordered. Nothing further needed.

## 2015-03-27 NOTE — Telephone Encounter (Signed)
Called spoke with pt. She reports at 4:30 she had fever of 101.5 then she ate some yogurt and at 4:50 it was 100.5. Pt has now taken 3 advil. Requesting further recs. Please advise MW thanks

## 2015-03-27 NOTE — Progress Notes (Signed)
 Subjective:    Patient ID: [name] [name], female    DOB: 11/14/1950,    MRN: [id number]  HPI  64 yowf never smoker works at [employer] [place] abruptly ill rx zpak Tmax 101.9  then 6 days later admitted:  Admit date: 03/18/2015 Discharge date: 03/21/2015  Recommendations for Outpatient Follow-up:  #1 Discharge home with outpatient PCP follow-up in 1 week #2 Patient with complete total seven-day course of Levaquin on 9/11. Patient will need a follow-up chest x-ray in 4 weeks to ensure resolution.  Discharge Diagnoses:  Principal problem Sepsis secondary to bacterial lobar pneumonia  Active Problems:  Hyperlipidemia  Orthostatic syncope   Discharge Condition:Fair  Diet recommendation: Regular  Filed Weights   03/18/15 2108  Weight: 60.6 kg (133 lb 9.6 oz)    History of present illness:  Please refer to admission H&P for details, in brief, 64-year-old female presented to the ED with one week history of fever with nonproductive cough and pain over her shoulder blades. On 8/31 patient was diagnosed with cognitive by pneumonia and treated with a course of Z-Pak .however patient continued to have low-grade fever and poor appetite. On the day of admission she got up to go to the bathroom, felt hot and shortly thereafter had a syncopal episode. Patient also complaining of dull pain in her shoulder blade. In the ED patient was found to have a fever of 100.1F with WBC of 16.6 and chest x-ray showing right lower lobe pneumonia . Patient met criteria for sepsis and admitted to medical floor.   Hospital Course:  Sepsis secondary to multifocal bacterial pneumonia Patient started on empiric vancomycin and Zosyn given sepsis and possible failed outpatient antibiotic therapy. She remains afebrile for the past 24 hours . Sepsis has resolved.. Cough and back pain improving. Still has leukocytosis. Patient clinically improved.Blood cultures, urine for strep antigen  and Legionella negative. CT angiogram of the chest on admission negative for PE. Patient will be discharged on oral Levaquin to complete a total 7 day course of antibiotics. Antitussives prescribed.  Upper back pain Appears to be secondary to pleurisy. Recommend to continue Tylenol when necessary  Orthostatic syncope.  Improved with IV fluids. A carotid Dopplers unremarkable.  Diarrhea Possibly associated with infection class antibiotics. C. difficile negative. resolved.  Protein calorie malnutrition Habits supplement  Hyperlipidemia Continue statin       03/22/2015 new pt consultation/ Wert   Chief Complaint  Patient presents with  . Pulmonary Consult    Referred by Dr. Ed Green.  Pt states that she was dxed with PNA 03/14/15. She c/o low grade fever and cough-non prod.     tmax 101.9 was the day after Zmax started  Cough better >  Scant yellow mucus started  New R cp started 03/21/15 / has not tried nsaids Mild nausea since 9/4 poor appetite, no vomiting rec  Try advil 200 mg three with meals as needed for fever or pain (tylenol can be used to supplement if needed)  Return first of the week for follow up cxr      03/27/2015 f/u ov/Wert re: cap with R pleuritic cp  Chief Complaint  Patient presents with  . Follow-up    CXR done today. Pt states cough is unchanged "but does not hurt" - still non prod.   much better w/in 24 h of starting advil with no more fever, no more R pleuritic cp, still dry hacking cough   No obvious day to   day or daytime variability or assoc   chest tightness, subjective wheeze or overt sinus or hb symptoms. No unusual exp hx or h/o childhood pna/ asthma or knowledge of premature birth.  Sleeping ok without nocturnal  or early am exacerbation  of respiratory  c/o's or need for noct saba. Also denies any obvious fluctuation of symptoms with weather or environmental changes or other aggravating or alleviating factors except as outlined above   Current  Medications, Allergies, Complete Past Medical History, Past Surgical History, Family History, and Social History were reviewed in [employer] record.  ROS  The following are not active complaints unless bolded sore throat, dysphagia, dental problems, itching, sneezing,  nasal congestion or excess/ purulent secretions, ear ache,   fever, chills, sweats, unintended wt loss, classically pleuritic or exertional cp, hemoptysis,  orthopnea pnd or leg swelling, presyncope, palpitations, abdominal pain, anorexia, nausea, vomiting, diarrhea  or change in bowel or bladder habits, change in stools or urine, dysuria,hematuria,  rash, arthralgias, visual complaints, headache, numbness, weakness or ataxia or problems with walking or coordination,  change in mood/affect or memory.                              Objective:   Physical Exam   amb healthy appearing thin wf nad    03/27/2015   Wt Readings from Last 3 Encounters:  03/22/15 133 lb 6.4 oz (60.51 kg)  03/18/15 133 lb 9.6 oz (60.6 kg)  04/12/14 135 lb (61.236 kg)    Vital signs reviewed   HEENT: nl dentition, turbinates, and orophanx. Nl external ear canals without cough reflex   NECK :  without JVD/Nodes/TM/ nl carotid upstrokes bilaterally   LUNGS: no acc muscle use, decreased bs R base, no rub, no classic bronchial changes or egophony    CV:  RRR  no s3 or murmur or increase in P2, no edema   ABD:  soft and nontender with nl excursion in the supine position. No bruits or organomegaly, bowel sounds nl  MS:  warm without deformities, calf tenderness, cyanosis or clubbing  SKIN: warm and dry without lesions    NEURO:  alert, approp, no deficits      CXR PA and Lateral:   03/27/2015 :     I personally reviewed images and agree with radiology impression as follows:    new  post layering small R Pleural effusion     Lab Results  Component Value Date   ESRSEDRATE 115* 03/27/2015   ESRSEDRATE 113*  03/22/2015     Lab Results  Component Value Date   WBC 22.1 Repeated and verified X2.* 03/27/2015   HGB 11.5* 03/27/2015   HCT 34.9* 03/27/2015   MCV 94.6 03/27/2015   PLT 707.0* 03/27/2015           Assessment & Plan:

## 2015-03-27 NOTE — Telephone Encounter (Signed)
Gill from lab called with a critical lab report on pt Stated that pt's WBC is 22.1  Will forward to Dr Gustavus Bryant attention  Dr Melvyn Novas, please advise

## 2015-03-27 NOTE — Patient Instructions (Addendum)
Please remember to go to the lab  department downstairs for your tests - we will call you with the results when they are available.  Change the advil to where you take it only as needed for pain.   Take delsym two tsp every 12 hours and supplement if needed with  tramadol 50 mg up to 1 every 4 hours to suppress the urge to cough. Swallowing water or using ice chips/non mint and menthol containing candies (such as lifesavers or sugarless jolly ranchers) are also effective.  You should rest your voice and avoid activities that you know make you cough.  Once you have eliminated the cough for 3 straight days try reducing the tramadol first,  then the delsym as tolerated.   No work Aug 31'st to 04/03/15    Please schedule a follow up office visit in 1 week , sooner if needed with cxr on return

## 2015-03-28 ENCOUNTER — Ambulatory Visit (INDEPENDENT_AMBULATORY_CARE_PROVIDER_SITE_OTHER)
Admission: RE | Admit: 2015-03-28 | Discharge: 2015-03-28 | Disposition: A | Discharge status: Home / Self Care | Payer: No Typology Code available for payment source | Source: Ambulatory Visit | Attending: Internal Medicine | Admitting: Internal Medicine

## 2015-03-28 DIAGNOSIS — J189 Pneumonia, unspecified organism: Secondary | ICD-10-CM | POA: Diagnosis not present

## 2015-03-28 MED ORDER — IOHEXOL 300 MG/ML  SOLN
80.0000 mL | Freq: Once | INTRAMUSCULAR | Status: AC | PRN
  Administered 2015-03-28: 80 mL via INTRAVENOUS

## 2015-03-29 ENCOUNTER — Ambulatory Visit (HOSPITAL_COMMUNITY)
Admission: RE | Admit: 2015-03-29 | Discharge: 2015-03-29 | Disposition: A | Discharge status: Home / Self Care | Payer: No Typology Code available for payment source | Source: Ambulatory Visit | Attending: Internal Medicine | Admitting: Internal Medicine

## 2015-03-29 ENCOUNTER — Ambulatory Visit (HOSPITAL_COMMUNITY)
Admission: RE | Admit: 2015-03-29 | Discharge: 2015-03-29 | Disposition: A | Discharge status: Home / Self Care | Payer: No Typology Code available for payment source | Source: Ambulatory Visit | Attending: Radiology | Admitting: Radiology

## 2015-03-29 ENCOUNTER — Other Ambulatory Visit: Payer: Self-pay | Admitting: Internal Medicine

## 2015-03-29 DIAGNOSIS — J918 Pleural effusion in other conditions classified elsewhere: Principal | ICD-10-CM

## 2015-03-29 DIAGNOSIS — J189 Pneumonia, unspecified organism: Secondary | ICD-10-CM

## 2015-03-29 DIAGNOSIS — J90 Pleural effusion, not elsewhere classified: Secondary | ICD-10-CM | POA: Insufficient documentation

## 2015-03-29 DIAGNOSIS — Z9889 Other specified postprocedural states: Secondary | ICD-10-CM

## 2015-03-29 LAB — BODY FLUID CELL COUNT WITH DIFFERENTIAL
EOS FL: 25 %
LYMPHS FL: 46 %
MONOCYTE-MACROPHAGE-SEROUS FLUID: 8 % — AB (ref 50–90)
NEUTROPHIL FLUID: 21 % (ref 0–25)
WBC FLUID: 2220 uL — AB (ref 0–1000)

## 2015-03-29 LAB — GRAM STAIN

## 2015-03-29 LAB — GLUCOSE, SEROUS FLUID: Glucose, Fluid: 99 mg/dL

## 2015-03-29 LAB — PROTEIN, BODY FLUID: Total protein, fluid: 4.1 g/dL

## 2015-03-29 LAB — LACTATE DEHYDROGENASE, PLEURAL OR PERITONEAL FLUID: LD FL: 472 U/L — AB (ref 3–23)

## 2015-03-29 NOTE — Procedures (Signed)
Successful US guided right thoracentesis. Yielded 200 ml of clear yellow fluid. Pt tolerated procedure well. No immediate complications.  Specimen was sent for labs. CXR ordered.  Tsosie Billing D PA-C 03/29/2015 2:15 PM

## 2015-03-30 ENCOUNTER — Telehealth: Payer: Self-pay | Admitting: Pulmonary Disease

## 2015-03-30 NOTE — Telephone Encounter (Signed)
Patient reports she continues to have a fever up to 102F this morning at 3:30am. Taking Advil intermittently. No chest wall pain. Still taking Levaquin. Baseline cough that is unchanged. Taking Delsym. Denies any dyspnea. Continuing to take Levaquin. Patient to use Advil only intermittently. She will call back if fever persists.

## 2015-03-31 ENCOUNTER — Other Ambulatory Visit: Payer: Self-pay | Admitting: Internal Medicine

## 2015-03-31 MED ORDER — AMOXICILLIN-POT CLAVULANATE 875-125 MG PO TABS: 1.0000 | ORAL_TABLET | Freq: Two times a day (BID) | ORAL | Status: DC

## 2015-03-31 NOTE — Progress Notes (Signed)
Temp 102 03/30/15 so rec augmentin x 10 days as she still has marked leukocytosis but no frank empyema.  Her allergy to ceph was x 30 y ago and has taken amox since  Rec: Augmentin 875 mg take one pill twice daily  X 10 days - take at breakfast and supper with large glass of water.  It would help reduce the usual side effects (diarrhea and yeast infections) if you ate cultured yogurt at lunch.

## 2015-04-02 ENCOUNTER — Other Ambulatory Visit: Payer: No Typology Code available for payment source

## 2015-04-02 NOTE — Progress Notes (Signed)
Quick Note:  Spoke with pt and notified of results per Dr. Wert. Pt verbalized understanding and denied any questions.  ______ 

## 2015-04-03 ENCOUNTER — Ambulatory Visit (HOSPITAL_COMMUNITY)
Admission: RE | Admit: 2015-04-03 | Discharge: 2015-04-03 | Disposition: A | Discharge status: Home / Self Care | Payer: No Typology Code available for payment source | Source: Ambulatory Visit | Attending: Internal Medicine | Admitting: Internal Medicine

## 2015-04-03 ENCOUNTER — Other Ambulatory Visit (INDEPENDENT_AMBULATORY_CARE_PROVIDER_SITE_OTHER): Payer: No Typology Code available for payment source

## 2015-04-03 ENCOUNTER — Ambulatory Visit (INDEPENDENT_AMBULATORY_CARE_PROVIDER_SITE_OTHER): Payer: No Typology Code available for payment source | Admitting: Internal Medicine

## 2015-04-03 ENCOUNTER — Inpatient Hospital Stay (HOSPITAL_COMMUNITY)
Admission: EM | Admit: 2015-04-03 | Discharge: 2015-04-10 | DRG: 166 | Disposition: A | Discharge status: Home / Self Care | Payer: No Typology Code available for payment source | Attending: Internal Medicine | Admitting: Internal Medicine

## 2015-04-03 ENCOUNTER — Encounter (HOSPITAL_COMMUNITY): Payer: Self-pay

## 2015-04-03 ENCOUNTER — Encounter (HOSPITAL_COMMUNITY): Payer: Self-pay | Admitting: *Deleted

## 2015-04-03 ENCOUNTER — Encounter: Payer: Self-pay | Admitting: Internal Medicine

## 2015-04-03 ENCOUNTER — Telehealth: Payer: Self-pay | Admitting: Internal Medicine

## 2015-04-03 ENCOUNTER — Other Ambulatory Visit: Payer: Self-pay

## 2015-04-03 ENCOUNTER — Ambulatory Visit (INDEPENDENT_AMBULATORY_CARE_PROVIDER_SITE_OTHER)
Admission: RE | Admit: 2015-04-03 | Discharge: 2015-04-03 | Disposition: A | Discharge status: Home / Self Care | Payer: No Typology Code available for payment source | Source: Ambulatory Visit | Attending: Internal Medicine | Admitting: Internal Medicine

## 2015-04-03 VITALS — BP 102/62 | HR 60 | Ht 66.5 in | Wt 129.8 lb

## 2015-04-03 DIAGNOSIS — J189 Pneumonia, unspecified organism: Secondary | ICD-10-CM

## 2015-04-03 DIAGNOSIS — M25442 Effusion, left hand: Secondary | ICD-10-CM | POA: Diagnosis not present

## 2015-04-03 DIAGNOSIS — Y95 Nosocomial condition: Secondary | ICD-10-CM | POA: Diagnosis present

## 2015-04-03 DIAGNOSIS — J159 Unspecified bacterial pneumonia: Secondary | ICD-10-CM | POA: Diagnosis not present

## 2015-04-03 DIAGNOSIS — M069 Rheumatoid arthritis, unspecified: Secondary | ICD-10-CM | POA: Diagnosis present

## 2015-04-03 DIAGNOSIS — A419 Sepsis, unspecified organism: Secondary | ICD-10-CM | POA: Diagnosis not present

## 2015-04-03 DIAGNOSIS — M779 Enthesopathy, unspecified: Secondary | ICD-10-CM | POA: Diagnosis not present

## 2015-04-03 DIAGNOSIS — E780 Pure hypercholesterolemia: Secondary | ICD-10-CM | POA: Diagnosis present

## 2015-04-03 DIAGNOSIS — J948 Other specified pleural conditions: Secondary | ICD-10-CM

## 2015-04-03 DIAGNOSIS — M7989 Other specified soft tissue disorders: Secondary | ICD-10-CM | POA: Diagnosis present

## 2015-04-03 DIAGNOSIS — J918 Pleural effusion in other conditions classified elsewhere: Secondary | ICD-10-CM

## 2015-04-03 DIAGNOSIS — T380X5A Adverse effect of glucocorticoids and synthetic analogues, initial encounter: Secondary | ICD-10-CM | POA: Diagnosis present

## 2015-04-03 DIAGNOSIS — M810 Age-related osteoporosis without current pathological fracture: Secondary | ICD-10-CM | POA: Diagnosis present

## 2015-04-03 DIAGNOSIS — E785 Hyperlipidemia, unspecified: Secondary | ICD-10-CM | POA: Diagnosis present

## 2015-04-03 DIAGNOSIS — J181 Lobar pneumonia, unspecified organism: Secondary | ICD-10-CM | POA: Diagnosis present

## 2015-04-03 DIAGNOSIS — M6281 Muscle weakness (generalized): Secondary | ICD-10-CM | POA: Diagnosis present

## 2015-04-03 DIAGNOSIS — I480 Paroxysmal atrial fibrillation: Secondary | ICD-10-CM | POA: Diagnosis present

## 2015-04-03 DIAGNOSIS — I4891 Unspecified atrial fibrillation: Secondary | ICD-10-CM | POA: Diagnosis not present

## 2015-04-03 DIAGNOSIS — J9601 Acute respiratory failure with hypoxia: Secondary | ICD-10-CM | POA: Diagnosis not present

## 2015-04-03 DIAGNOSIS — I10 Essential (primary) hypertension: Secondary | ICD-10-CM | POA: Diagnosis not present

## 2015-04-03 DIAGNOSIS — Z96641 Presence of right artificial hip joint: Secondary | ICD-10-CM | POA: Diagnosis present

## 2015-04-03 DIAGNOSIS — R0602 Shortness of breath: Secondary | ICD-10-CM | POA: Diagnosis present

## 2015-04-03 DIAGNOSIS — J90 Pleural effusion, not elsewhere classified: Secondary | ICD-10-CM | POA: Diagnosis present

## 2015-04-03 DIAGNOSIS — M25441 Effusion, right hand: Secondary | ICD-10-CM | POA: Diagnosis not present

## 2015-04-03 DIAGNOSIS — Z8249 Family history of ischemic heart disease and other diseases of the circulatory system: Secondary | ICD-10-CM | POA: Diagnosis not present

## 2015-04-03 DIAGNOSIS — M0510 Rheumatoid lung disease with rheumatoid arthritis of unspecified site: Secondary | ICD-10-CM

## 2015-04-03 HISTORY — DX: Pleural effusion, not elsewhere classified: J90

## 2015-04-03 LAB — CBC WITH DIFFERENTIAL/PLATELET
BASOS ABS: 0 10*3/uL (ref 0.0–0.1)
Basophils Relative: 0.1 % (ref 0.0–3.0)
EOS ABS: 0.3 10*3/uL (ref 0.0–0.7)
Eosinophils Relative: 1.1 % (ref 0.0–5.0)
HCT: 35.1 % — ABNORMAL LOW (ref 36.0–46.0)
Hemoglobin: 11.7 g/dL — ABNORMAL LOW (ref 12.0–15.0)
LYMPHS ABS: 1.2 10*3/uL (ref 0.7–4.0)
LYMPHS PCT: 4.5 % — AB (ref 12.0–46.0)
MCHC: 33.2 g/dL (ref 30.0–36.0)
MCV: 93.5 fl (ref 78.0–100.0)
Monocytes Absolute: 0.4 10*3/uL (ref 0.1–1.0)
Monocytes Relative: 1.6 % — ABNORMAL LOW (ref 3.0–12.0)
NEUTROS ABS: 24 10*3/uL — AB (ref 1.4–7.7)
NEUTROS PCT: 92.7 % — AB (ref 43.0–77.0)
PLATELETS: 780 10*3/uL — AB (ref 150.0–400.0)
RBC: 3.76 Mil/uL — AB (ref 3.87–5.11)
RDW: 13 % (ref 11.5–15.5)
WBC: 25.9 10*3/uL (ref 4.0–10.5)

## 2015-04-03 LAB — BASIC METABOLIC PANEL
BUN: 10 mg/dL (ref 6–23)
CALCIUM: 9.1 mg/dL (ref 8.4–10.5)
CO2: 27 mEq/L (ref 19–32)
Chloride: 95 mEq/L — ABNORMAL LOW (ref 96–112)
Creatinine, Ser: 0.53 mg/dL (ref 0.40–1.20)
GFR: 122.75 mL/min (ref >60.00)
Glucose, Bld: 151 mg/dL — ABNORMAL HIGH (ref 70–99)
POTASSIUM: 3.7 meq/L (ref 3.5–5.1)
SODIUM: 132 meq/L — AB (ref 135–145)

## 2015-04-03 LAB — I-STAT CG4 LACTIC ACID, ED
LACTIC ACID, VENOUS: 1.5 mmol/L (ref 0.5–2.0)
Lactic Acid, Venous: 1.36 mmol/L (ref 0.5–2.0)

## 2015-04-03 LAB — CULTURE, BODY FLUID W GRAM STAIN -BOTTLE

## 2015-04-03 LAB — TROPONIN I
TROPONIN I: 0.17 ng/mL — AB (ref <0.031)
Troponin I: 0.18 ng/mL — ABNORMAL HIGH (ref <0.031)

## 2015-04-03 LAB — CK: CK TOTAL: 33 U/L (ref 7–177)

## 2015-04-03 LAB — CULTURE, BODY FLUID-BOTTLE: CULTURE: NO GROWTH

## 2015-04-03 LAB — SEDIMENTATION RATE: Sed Rate: 117 mm/hr — ABNORMAL HIGH (ref 0–22)

## 2015-04-03 MED ORDER — DEXTROSE 5 % IV SOLN: 5.0000 mg/h | INTRAVENOUS | Status: DC

## 2015-04-03 MED ORDER — VANCOMYCIN HCL IN DEXTROSE 750-5 MG/150ML-% IV SOLN
750.0000 mg | Freq: Two times a day (BID) | INTRAVENOUS | Status: DC
  Administered 2015-04-04 – 2015-04-05 (×4): 750 mg via INTRAVENOUS
  Filled 2015-04-03 (×7): qty 150

## 2015-04-03 MED ORDER — PIPERACILLIN-TAZOBACTAM 3.375 G IVPB
3.3750 g | Freq: Once | INTRAVENOUS | Status: AC
  Administered 2015-04-03: 3.375 g via INTRAVENOUS
  Filled 2015-04-03: qty 50

## 2015-04-03 MED ORDER — ACETAMINOPHEN 500 MG PO TABS
1000.0000 mg | ORAL_TABLET | Freq: Four times a day (QID) | ORAL | Status: DC | PRN
  Administered 2015-04-03 – 2015-04-08 (×13): 1000 mg via ORAL
  Filled 2015-04-03 (×15): qty 2

## 2015-04-03 MED ORDER — SODIUM CHLORIDE 0.9 % IV SOLN
Freq: Once | INTRAVENOUS | Status: AC
  Administered 2015-04-03: 75 mL via INTRAVENOUS

## 2015-04-03 MED ORDER — IOHEXOL 350 MG/ML SOLN
100.0000 mL | Freq: Once | INTRAVENOUS | Status: AC | PRN
  Administered 2015-04-03: 100 mL via INTRAVENOUS

## 2015-04-03 MED ORDER — DILTIAZEM LOAD VIA INFUSION
20.0000 mg | Freq: Once | INTRAVENOUS | Status: DC
  Filled 2015-04-03: qty 20

## 2015-04-03 MED ORDER — DEXTROMETHORPHAN-MENTHOL 5-5 MG MT LOZG: LOZENGE | Freq: Four times a day (QID) | OROMUCOSAL | Status: DC | PRN

## 2015-04-03 MED ORDER — TRAMADOL HCL 50 MG PO TABS: 50.0000 mg | ORAL_TABLET | ORAL | Status: DC | PRN

## 2015-04-03 MED ORDER — MENTHOL 3 MG MT LOZG
1.0000 | LOZENGE | OROMUCOSAL | Status: DC | PRN
  Filled 2015-04-03: qty 9

## 2015-04-03 MED ORDER — ASPIRIN 81 MG PO CHEW
324.0000 mg | CHEWABLE_TABLET | Freq: Once | ORAL | Status: AC
  Administered 2015-04-03: 324 mg via ORAL
  Filled 2015-04-03: qty 4

## 2015-04-03 MED ORDER — IBUPROFEN 200 MG PO TABS: 600.0000 mg | ORAL_TABLET | Freq: Three times a day (TID) | ORAL | Status: DC | PRN

## 2015-04-03 MED ORDER — VANCOMYCIN HCL IN DEXTROSE 1-5 GM/200ML-% IV SOLN
1000.0000 mg | Freq: Once | INTRAVENOUS | Status: AC
  Administered 2015-04-03: 1000 mg via INTRAVENOUS
  Filled 2015-04-03: qty 200

## 2015-04-03 MED ORDER — HEPARIN SODIUM (PORCINE) 5000 UNIT/ML IJ SOLN
5000.0000 [IU] | Freq: Three times a day (TID) | INTRAMUSCULAR | Status: DC
  Administered 2015-04-04 – 2015-04-09 (×13): 5000 [IU] via SUBCUTANEOUS
  Filled 2015-04-03 (×19): qty 1

## 2015-04-03 MED ORDER — PIPERACILLIN-TAZOBACTAM 3.375 G IVPB
3.3750 g | Freq: Three times a day (TID) | INTRAVENOUS | Status: DC
  Administered 2015-04-04 – 2015-04-08 (×13): 3.375 g via INTRAVENOUS
  Filled 2015-04-03 (×14): qty 50

## 2015-04-03 NOTE — ED Provider Notes (Signed)
CSN: [id number]     Arrival date & time 04/03/15  1738 History   First MD Initiated Contact with Patient 04/03/15 1806     No chief complaint on file.    (Consider location/radiation/quality/duration/timing/severity/associated sxs/prior Treatment) HPI  The patient is a 64 year old female, recent history of pneumonia which left her in the hospital for some time, has recently developed a fever after having a thoracentesis several days ago. She is still coughing, she is now feeling tachypneic, she saw her pulmonologist in the office this morning to obtain a chest x-ray and a CT scan and asked to come back to the hospital if her symptoms worsened. She has been on Augmentin for several days. She had a syncopal episode overnight while sitting on the commode, she did not fall, at this time has pain on the left side of her chest, increased coughing and shortness of breath, no swelling of her legs. She is feeling extremely weak and has no appetite  Past Medical History  Diagnosis Date  . Atrophic vaginitis   . Elevated cholesterol   . Osteoporosis 2015    based on hip fracture from fall  DEXA 2014 T score -2.1 FRAX 19%/1.7%  . Pneumonia    Past Surgical History  Procedure Laterality Date  . Finger surgery    . Hip arthroplasty Right 11/14/2013    Procedure: RIGHT CANNULATED HIP PINNING;  Surgeon: Alta Corning, MD;  Location: [facility];  Service: Orthopedics;  Laterality: Right;  BIOMET   Family History  Problem Relation Age of Onset  . Heart attack Father 59   Social History  Substance Use Topics  . Smoking status: Never Smoker   . Smokeless tobacco: Never Used  . Alcohol Use: No   OB History    Gravida Para Term Preterm AB TAB SAB Ectopic Multiple Living   2 2 2       2      Review of Systems  All other systems reviewed and are negative.     Allergies  Levaquin and Cephalexin  Home Medications   Prior to Admission medications   Medication Sig Start Date End Date Taking?  Authorizing Provider  amoxicillin-clavulanate (AUGMENTIN) 875-125 MG per tablet Take 1 tablet by mouth 2 (two) times daily. 03/31/15  Yes Tanda Rockers, MD  Dextromethorphan-Menthol [facility] COUGH RELIEF MT) Use as directed 5 mLs in the mouth or throat every 6 (six) hours as needed (cough/cold).   Yes Historical Provider, MD  ibuprofen (ADVIL,MOTRIN) 200 MG tablet Take 600 mg by mouth every 8 (eight) hours as needed for fever, headache or moderate pain.    Yes Historical Provider, MD  traMADol (ULTRAM) 50 MG tablet Take 1-2 tablets by mouth every 4 (four) hours as needed. pain 03/27/15  Yes Historical Provider, MD  levofloxacin (LEVAQUIN) 750 MG tablet Take 1 tablet by mouth daily. 5 days 03/21/15-03/25/15 03/21/15   Historical Provider, MD   BP 144/105 mmHg  Pulse 122  Temp(Src) 100.9 F (38.3 C) (Oral)  Resp 25  SpO2 96%  LMP 07/14/1993 Physical Exam  Constitutional: She appears well-developed and well-nourished. She appears distressed.  HENT:  Head: Normocephalic and atraumatic.  Mouth/Throat: Oropharynx is clear and moist. No oropharyngeal exudate.  Eyes: Conjunctivae and EOM are normal. Pupils are equal, round, and reactive to light. Right eye exhibits no discharge. Left eye exhibits no discharge. No scleral icterus.  Neck: Normal range of motion. Neck supple. No JVD present. No thyromegaly present.  Cardiovascular: Normal heart sounds and  intact distal pulses.  Exam reveals no gallop and no friction rub.   No murmur heard. Tachycardia, rapid ventricular rate, irregular, strong pulses  Pulmonary/Chest: Breath sounds normal. She is in respiratory distress. She has no wheezes. She has no rales.  Ongoing difficulty breathing, she has shallow breathing, tachypnea, able to speak in shortened sentences, no wheezing, subtle rales scattered  Abdominal: Soft. Bowel sounds are normal. She exhibits no distension and no mass. There is no tenderness.  Musculoskeletal: Normal range of motion. She exhibits  no edema or tenderness.  Lymphadenopathy:    She has no cervical adenopathy.  Neurological: She is alert. Coordination normal.  Skin: Skin is warm and dry. No rash noted. No erythema.  Psychiatric: She has a normal mood and affect. Her behavior is normal.  Nursing note and vitals reviewed.   ED Course  Procedures (including critical care time) Labs Review Labs Reviewed  TROPONIN I - Abnormal; Notable for the following:    Troponin I 0.17 (*)    All other components within normal limits  CULTURE, BLOOD (ROUTINE X 2)  CULTURE, BLOOD (ROUTINE X 2)  I-STAT CG4 LACTIC ACID, ED    Imaging Review Dg Chest 2 View  04/03/2015   CLINICAL DATA:  Follow-up thoracentesis.  Right pleural effusion.  EXAM: CHEST  2 VIEW  COMPARISON:  03/29/2015.  FINDINGS: Mediastinum hilar structures normal. Persistent right lower lobe infiltrate. Small right pleural effusion. No pneumothorax. Left lung base subsegmental atelectasis . Small left pleural effusion present. Heart size normal. No acute bony abnormality.  IMPRESSION: 1. Persistent right lower lobe infiltrate. 2. Small residua right pleural effusion. No no pneumothorax post thoracentesis. 3. Left lung base subsegmental atelectasis. Small left pleural effusion.   Electronically Signed   By: Marcello Moores  Register   On: 04/03/2015 09:53   Ct Angio Chest W/cm &/or Wo Cm  04/03/2015   CLINICAL DATA:  Shortness of breath, cough.  EXAM: CT ANGIOGRAPHY CHEST WITH CONTRAST  TECHNIQUE: Multidetector CT imaging of the chest was performed using the standard protocol during bolus administration of intravenous contrast. Multiplanar CT image reconstructions and MIPs were obtained to evaluate the vascular anatomy.  CONTRAST:  180mL OMNIPAQUE IOHEXOL 350 MG/ML SOLN  COMPARISON:  Chest radiograph of same day. CT scan of March 28, 2015.  FINDINGS: No pneumothorax is noted. Small bilateral pleural effusions are noted posteriorly. Stable right middle lobe opacity is noted with air  bronchograms concerning for pneumonia. Mild bilateral posterior basilar subsegmental atelectasis is noted. Atelectasis or inflammation is noted anteriorly in the lingular segment of the left upper lobe. Stable opacity is noted medially in the left upper lobe concerning for inflammation. Stable consolidation is noted in the retro hilar region of the right upper lobe.  There is no evidence of thoracic aortic dissection or aneurysm. There is no evidence of pulmonary embolus. Visualized portion of upper abdomen is unremarkable.  Review of the MIP images confirms the above findings.  IMPRESSION: No evidence of pulmonary embolus.  Stable multifocal airspace opacities are noted, most prominently seen in the right middle lobe, consistent with pneumonia. Continued CT follow-up is recommended to ensure resolution and rule out underlying neoplasm.  Mild bilateral posterior basilar subsegmental atelectasis is noted. Small bilateral pleural effusions are noted posteriorly.   Electronically Signed   By: Marijo Conception, M.D.   On: 04/03/2015 14:51   I have personally reviewed and evaluated these images and lab results as part of my medical decision-making.  ED ECG REPORT  I  personally interpreted this EKG   Date: 04/03/2015   Rate: 178  Rhythm: atrial fibrillation  QRS Axis: normal  Intervals: normal  ST/T Wave abnormalities: nonspecific ST/T changes  Conduction Disutrbances:none  Narrative Interpretation:   Old EKG Reviewed: since last tracing - afib now present   MDM   Final diagnoses:  Sepsis, due to unspecified organism  HCAP (healthcare-associated pneumonia)  Paroxysmal atrial fibrillation    The patient appears to be clinically in atrial fibrillation with a rapid ventricular rate, she has a blood pressure which is adequate but low, oxygenation at 94%, needs rate control, fluids, IV antibiotics, admit as this is likely ongoing pneumonia with a significant cardiac side effect of the underlying process.  She has never had a true fibrillation in the past, no history of obstructive disease.  The patient has persistent tachycardia, this improved spontaneously without medications, atrial fibrillation with rapid ventricular rate improved to sinus tachycardia, repeat EKG has sinus tachycardia with nonspecific ST and T waves, labs show severe leukocytosis, chest x-ray and CT scans reviewed from earlier in the day, I personally viewed these images and agree with the radiologist, left-sided pleural effusion now present, right-sided infiltrate still present.  Vital signs remained tachycardic, febrile to 100.9, medications given as below, discussed with the hospitalist Dr. Alcario Drought who will admit. Discussed with pulmonary critical care who will consult.  CRITICAL CARE Performed by: Johnna Acosta Total critical care time: 35 Critical care time was exclusive of separately billable procedures and treating other patients. Critical care was necessary to treat or prevent imminent or life-threatening deterioration. Critical care was time spent personally by me on the following activities: development of treatment plan with patient and/or surrogate as well as nursing, discussions with consultants, evaluation of patient's response to treatment, examination of patient, obtaining history from patient or surrogate, ordering and performing treatments and interventions, ordering and review of laboratory studies, ordering and review of radiographic studies, pulse oximetry and re-evaluation of patient's condition.  Meds given in ED:  Medications  diltiazem (CARDIZEM) 1 mg/mL load via infusion 20 mg (0 mg Intravenous Hold 04/03/15 1912)    And  diltiazem (CARDIZEM) 100 mg in dextrose 5 % 100 mL (1 mg/mL) infusion (0 mg/hr Intravenous Hold 04/03/15 1912)  vancomycin (VANCOCIN) IVPB 1000 mg/200 mL premix (not administered)  piperacillin-tazobactam (ZOSYN) IVPB 3.375 g (3.375 g Intravenous New Bag/Given 04/03/15 2005)  0.9 %   sodium chloride infusion (75 mLs Intravenous New Bag/Given 04/03/15 1842)  aspirin chewable tablet 324 mg (324 mg Oral Given 04/03/15 1959)     Noemi Chapel, MD 04/03/15 2012

## 2015-04-03 NOTE — ED Notes (Signed)
Informed MD of patient's HR-MD assessed patient and patient is no longer in A-Fib

## 2015-04-03 NOTE — Telephone Encounter (Signed)
[place]

## 2015-04-03 NOTE — Progress Notes (Signed)
Quick Note:  LMTCB ______ 

## 2015-04-03 NOTE — Assessment & Plan Note (Signed)
-   onset of symptoms 03/13/15 rx zmax then vanc/zosyn then levaquin 9/7 -  03/25/15  - augmentin rx 03/31/15 >>>  leukocyctosis suggests ongoing infection but concerned it's from undrained empyema at this point and not active pna  rec CTa next

## 2015-04-03 NOTE — Progress Notes (Signed)
ANTIBIOTIC CONSULT NOTE - INITIAL  Pharmacy Consult for Vancomycin & Zosyn Indication: pneumonia  Allergies  Allergen Reactions  . Levaquin [Levofloxacin]     Joint pain/soreness, pain in hands, calf's.  . Cephalexin Rash    Patient Measurements:    Vital Signs: Temp: 100.9 F (38.3 C) (09/20 1818) Temp Source: Oral (09/20 1818) BP: 144/105 mmHg (09/20 1818) Pulse Rate: 122 (09/20 1844) Intake/Output from previous day:   Intake/Output from this shift:    Labs:  Recent Labs  04/03/15 1122  WBC 25.9 Repeated and verified X2.*  HGB 11.7*  PLT 780.0*  CREATININE 0.53   Estimated Creatinine Clearance: 65.2 mL/min (by C-G formula based on Cr of 0.53). No results for input(s): VANCOTROUGH, VANCOPEAK, VANCORANDOM, GENTTROUGH, GENTPEAK, GENTRANDOM, TOBRATROUGH, TOBRAPEAK, TOBRARND, AMIKACINPEAK, AMIKACINTROU, AMIKACIN in the last 72 hours.   Microbiology: Recent Results (from the past 720 hour(s))  Blood culture (routine x 2)     Status: None   Collection Time: 03/18/15  7:39 PM  Result Value Ref Range Status   Specimen Description BLOOD LEFT ARM  Final   Special Requests BOTTLES DRAWN AEROBIC AND ANAEROBIC 5CC  Final   Culture   Final    NO GROWTH 5 DAYS Performed at [facility]    Report Status 03/24/2015 FINAL  Final  Blood culture (routine x 2)     Status: None   Collection Time: 03/18/15  8:27 PM  Result Value Ref Range Status   Specimen Description BLOOD RIGHT FOREARM  Final   Special Requests BOTTLES DRAWN AEROBIC AND ANAEROBIC 5ML  Final   Culture   Final    NO GROWTH 5 DAYS Performed at [facility]    Report Status 03/24/2015 FINAL  Final  C difficile quick scan w PCR reflex     Status: None   Collection Time: 03/20/15  6:58 PM  Result Value Ref Range Status   C Diff antigen NEGATIVE NEGATIVE Final   C Diff toxin NEGATIVE NEGATIVE Final   C Diff interpretation Negative for toxigenic C. difficile  Final  Culture, body fluid-bottle      Status: None   Collection Time: 03/29/15  2:21 PM  Result Value Ref Range Status   Specimen Description FLUID PLEURAL  Final   Special Requests BOTTLES DRAWN AEROBIC AND ANAEROBIC 10CC  Final   Culture   Final    NO GROWTH 5 DAYS Performed at [facility]    Report Status 04/03/2015 FINAL  Final  Gram stain     Status: None   Collection Time: 03/29/15  2:21 PM  Result Value Ref Range Status   Specimen Description FLUID PLEURAL  Final   Special Requests NONE  Final   Gram Stain   Final    WBC PRESENT,BOTH PMN AND MONONUCLEAR NO ORGANISMS SEEN CYTOSPIN Performed at [facility]    Report Status 03/29/2015 FINAL  Final    Medical History: Past Medical History  Diagnosis Date  . Atrophic vaginitis   . Elevated cholesterol   . Osteoporosis 2015    based on hip fracture from fall  DEXA 2014 T score -2.1 FRAX 19%/1.7%  . Pneumonia     Medications:  Scheduled:  . diltiazem  20 mg Intravenous Once  . [START ON 04/04/2015] vancomycin  750 mg Intravenous Q12H   Infusions:  . diltiazem (CARDIZEM) infusion Stopped (04/03/15 1912)  . piperacillin-tazobactam (ZOSYN)  IV 3.375 g (04/03/15 2005)  . [START ON 04/04/2015] piperacillin-tazobactam (ZOSYN)  IV    .  vancomycin     Assessment:  64 yr female with recent h/o pneumonia presents with fever after recent thoracentesis.  Presents to ED with complaints of worsening symptoms.  Pt found to be in AFib and ongoing pneumonia  Pharmacy consulted Vancomycin and Zosyn for pneumonia  Vancomycin 1gm and Zosyn 3.375gm x 1 doses ordered in ED  Blood and sputum cultures ordered  Goal of Therapy:  Vancomycin trough level 15-20 mcg/ml  Plan:  Measure antibiotic drug levels at steady state Follow up culture results  Vancomycin 750mg  IV q12h Zosyn 3.375gm IV q8h (each dose infused over 4 hrs)  Poindexter, Toribio Harbour, PharmD 04/03/2015,8:20 PM

## 2015-04-03 NOTE — H&P (Signed)
[facility] History and Physical  Luvinia Lucy UGQ:[id number] DOB: 1951-04-19 DOA: 04/03/2015  Referring physician: EDP PCP: Criselda Peaches, MD   Chief Complaint: SOB   HPI: [name] [name] is a 64 y.o. female with h/o ongoing trouble with a RML PNA, complicated by R pleural effusion that was drained.  Multiple courses of ABx and hospital admit have failed thus far to clear this up.  Cultures have not yielded an organism.  R pleural effusion was negative on cytology for malignant cells.  Outpatient meds have included azithromycin, Augmentin, levaquin.  When she was last in hospital earlier this month she was on 3 days of zosyn/vanc.  She is following with Dr. Melvyn Novas whom she saw in office today as well before coming to ED.  Review of Systems: Systems reviewed.  As above, otherwise negative  Past Medical History  Diagnosis Date  . Atrophic vaginitis   . Elevated cholesterol   . Osteoporosis 2015    based on hip fracture from fall  DEXA 2014 T score -2.1 FRAX 19%/1.7%  . Pneumonia    Past Surgical History  Procedure Laterality Date  . Finger surgery    . Hip arthroplasty Right 11/14/2013    Procedure: RIGHT CANNULATED HIP PINNING;  Surgeon: Alta Corning, MD;  Location: [facility];  Service: Orthopedics;  Laterality: Right;  BIOMET   Social History:  reports that she has never smoked. She has never used smokeless tobacco. She reports that she does not drink alcohol or use illicit drugs.  Allergies  Allergen Reactions  . Levaquin [Levofloxacin]     Joint pain/soreness, pain in hands, calf's.  . Cephalexin Rash    Family History  Problem Relation Age of Onset  . Heart attack Father 60     Prior to Admission medications   Medication Sig Start Date End Date Taking? Authorizing Hannan Hutmacher  amoxicillin-clavulanate (AUGMENTIN) 875-125 MG per tablet Take 1 tablet by mouth 2 (two) times daily. 03/31/15  Yes Tanda Rockers, MD  Dextromethorphan-Menthol [facility] COUGH RELIEF MT) Use as  directed 5 mLs in the mouth or throat every 6 (six) hours as needed (cough/cold).   Yes Historical Hatcher Froning, MD  ibuprofen (ADVIL,MOTRIN) 200 MG tablet Take 600 mg by mouth every 8 (eight) hours as needed for fever, headache or moderate pain.    Yes Historical Lisa Milian, MD  traMADol (ULTRAM) 50 MG tablet Take 1-2 tablets by mouth every 4 (four) hours as needed. pain 03/27/15  Yes Historical Zakya Halabi, MD  levofloxacin (LEVAQUIN) 750 MG tablet Take 1 tablet by mouth daily. 5 days 03/21/15-03/25/15 03/21/15   Historical Fynley Chrystal, MD   Physical Exam: Filed Vitals:   04/03/15 1844  BP:   Pulse: 122  Temp:   Resp:     BP 144/105 mmHg  Pulse 122  Temp(Src) 100.9 F (38.3 C) (Oral)  Resp 25  SpO2 96%  LMP 07/14/1993  General Appearance:    Alert, oriented, no distress, appears stated age  Head:    Normocephalic, atraumatic  Eyes:    PERRL, EOMI, sclera non-icteric        Nose:   Nares without drainage or epistaxis. Mucosa, turbinates normal  Throat:   Moist mucous membranes. Oropharynx without erythema or exudate.  Neck:   Supple. No carotid bruits.  No thyromegaly.  No lymphadenopathy.   Back:     No CVA tenderness, no spinal tenderness  Lungs:     Clear to auscultation bilaterally, without wheezes, rhonchi or rales  Chest wall:  No tenderness to palpitation  Heart:    Regular rate and rhythm without murmurs, gallops, rubs  Abdomen:     Soft, non-tender, nondistended, normal bowel sounds, no organomegaly  Genitalia:    deferred  Rectal:    deferred  Extremities:   No clubbing, cyanosis or edema.  Pulses:   2+ and symmetric all extremities  Skin:   Skin color, texture, turgor normal, no rashes or lesions  Lymph nodes:   Cervical, supraclavicular, and axillary nodes normal  Neurologic:   CNII-XII intact. Normal strength, sensation and reflexes      throughout    Labs on Admission:  Basic Metabolic Panel:  Recent Labs Lab 04/03/15 1122  NA 132*  K 3.7  CL 95*  CO2 27  GLUCOSE  151*  BUN 10  CREATININE 0.53  CALCIUM 9.1   Liver Function Tests: No results for input(s): AST, ALT, ALKPHOS, BILITOT, PROT, ALBUMIN in the last 168 hours. No results for input(s): LIPASE, AMYLASE in the last 168 hours. No results for input(s): AMMONIA in the last 168 hours. CBC:  Recent Labs Lab 04/03/15 1122  WBC 25.9 Repeated and verified X2.*  NEUTROABS 24.0*  HGB 11.7*  HCT 35.1*  MCV 93.5  PLT 780.0*   Cardiac Enzymes:  Recent Labs Lab 04/03/15 1122 04/03/15 1847  CKTOTAL 33  --   TROPONINI  --  0.17*    BNP (last 3 results) No results for input(s): PROBNP in the last 8760 hours. CBG: No results for input(s): GLUCAP in the last 168 hours.  Radiological Exams on Admission: Dg Chest 2 View  04/03/2015   CLINICAL DATA:  Follow-up thoracentesis.  Right pleural effusion.  EXAM: CHEST  2 VIEW  COMPARISON:  03/29/2015.  FINDINGS: Mediastinum hilar structures normal. Persistent right lower lobe infiltrate. Small right pleural effusion. No pneumothorax. Left lung base subsegmental atelectasis . Small left pleural effusion present. Heart size normal. No acute bony abnormality.  IMPRESSION: 1. Persistent right lower lobe infiltrate. 2. Small residua right pleural effusion. No no pneumothorax post thoracentesis. 3. Left lung base subsegmental atelectasis. Small left pleural effusion.   Electronically Signed   By: Marcello Moores  Register   On: 04/03/2015 09:53   Ct Angio Chest W/cm &/or Wo Cm  04/03/2015   CLINICAL DATA:  Shortness of breath, cough.  EXAM: CT ANGIOGRAPHY CHEST WITH CONTRAST  TECHNIQUE: Multidetector CT imaging of the chest was performed using the standard protocol during bolus administration of intravenous contrast. Multiplanar CT image reconstructions and MIPs were obtained to evaluate the vascular anatomy.  CONTRAST:  134mL OMNIPAQUE IOHEXOL 350 MG/ML SOLN  COMPARISON:  Chest radiograph of same day. CT scan of March 28, 2015.  FINDINGS: No pneumothorax is noted.  Small bilateral pleural effusions are noted posteriorly. Stable right middle lobe opacity is noted with air bronchograms concerning for pneumonia. Mild bilateral posterior basilar subsegmental atelectasis is noted. Atelectasis or inflammation is noted anteriorly in the lingular segment of the left upper lobe. Stable opacity is noted medially in the left upper lobe concerning for inflammation. Stable consolidation is noted in the retro hilar region of the right upper lobe.  There is no evidence of thoracic aortic dissection or aneurysm. There is no evidence of pulmonary embolus. Visualized portion of upper abdomen is unremarkable.  Review of the MIP images confirms the above findings.  IMPRESSION: No evidence of pulmonary embolus.  Stable multifocal airspace opacities are noted, most prominently seen in the right middle lobe, consistent with pneumonia. Continued CT follow-up is  recommended to ensure resolution and rule out underlying neoplasm.  Mild bilateral posterior basilar subsegmental atelectasis is noted. Small bilateral pleural effusions are noted posteriorly.   Electronically Signed   By: Marijo Conception, M.D.   On: 04/03/2015 14:51    EKG: Independently reviewed.  Assessment/Plan Active Problems:   Sepsis   Bacterial lobar pneumonia   1. Bacterial lobar PNA causing sepsis - 1. Zosyn and vanc empirically 2. PNA pathway 3. Consulting pulmonology: 1. Bronchoscope to try and get organism? 2. BOOP/COP? 4. Trend leukocytosis, repeat CBC in AM 2. A.Fib RVR - 1. Tele monitor for tachycardia (now S.Tach in the 120s, was A.Fib RVR in 180s earlier today) 2. Serial trops 3. Pleural effusion 1. Only small B pleural effusions at this point, seems mostly resolved s/p drainage on the 15th, cytology showed a bunch of inflammation but no growth on cultures and no malignant cells.   Code Status: Full Code  Family Communication: Family at bedside Disposition Plan: Admit to inpatient   Time spent: 70  min  GARDNER, JARED M. [facility] Pager [phone]  If 7AM-7PM, please contact the day team taking care of the patient [email or web address] Password [facility] 04/03/2015, 8:31 PM

## 2015-04-03 NOTE — Telephone Encounter (Signed)
Received Critical lab report from lab 25,900 white count  Dr. Melvyn Novas Please advise

## 2015-04-03 NOTE — Assessment & Plan Note (Signed)
Present on cxr clearly on 03/27/2015  - CT chest with contrast 03/28/2015 > ? Empyema > 03/29/2015 [place] t centesis >>> exudative 200 ml seroud fluid with wbc 2200 L > N and 25 E, nl glucose and cytology neg     Now with new L pleuritic CP so concerned about new pna/ empyema or unrelated PE > Repeat CTa indicated

## 2015-04-03 NOTE — ED Notes (Signed)
Pt is concerned about not eating and wondering if she needs nutrition through her IV. Also, she wanted to ask the admitting physician if she needed probiotics since she had been eating yogurt with her antibiotic at home.

## 2015-04-03 NOTE — Progress Notes (Signed)
Subjective:    Patient ID: [name] [name], female    DOB: 08/13/1950,    MRN: [id number]  HPI  37 yowf never smoker works at [employer] able hike at [employer] abruptly ill rx zpak Tmax 101.9  then 6 days later admitted:  Admit date: 03/18/2015 Discharge date: 03/21/2015  Recommendations for Outpatient Follow-up:  #1 Discharge home with outpatient PCP follow-up in 1 week #2 Patient with complete total seven-day course of Levaquin on 9/11. Patient will need a follow-up chest x-ray in 4 weeks to ensure resolution.  Discharge Diagnoses:  Principal problem Sepsis secondary to bacterial lobar pneumonia  Active Problems:  Hyperlipidemia  Orthostatic syncope   Discharge Condition:Fair  Diet recommendation: Regular  Filed Weights   03/18/15 2108  Weight: 60.6 kg (133 lb 9.6 oz)    History of present illness:  Please refer to admission H&P for details, in brief, 64 year old female presented to the ED with one week history of fever with nonproductive cough and pain over her shoulder blades. On 8/31 patient was diagnosed with cognitive by pneumonia and treated with a course of Z-Pak .however patient continued to have low-grade fever and poor appetite. On the day of admission she got up to go to the bathroom, felt hot and shortly thereafter had a syncopal episode. Patient also complaining of dull pain in her shoulder blade. In the ED patient was found to have a fever of 100.61F with WBC of 16.6 and chest x-ray showing right lower lobe pneumonia . Patient met criteria for sepsis and admitted to medical floor.   Hospital Course:  Sepsis secondary to multifocal bacterial pneumonia Patient started on empiric vancomycin and Zosyn given sepsis and possible failed outpatient antibiotic therapy. She remains afebrile for the past 24 hours . Sepsis has resolved.. Cough and back pain improving. Still has leukocytosis. Patient clinically improved.Blood cultures, urine for strep antigen  and Legionella negative. CT angiogram of the chest on admission negative for PE. Patient will be discharged on oral Levaquin to complete a total 7 day course of antibiotics. Antitussives prescribed.  Upper back pain Appears to be secondary to pleurisy. Recommend to continue Tylenol when necessary  Orthostatic syncope.  Improved with IV fluids. A carotid Dopplers unremarkable.  Diarrhea Possibly associated with infection class antibiotics. C. difficile negative. resolved.  Protein calorie malnutrition Habits supplement  Hyperlipidemia Continue statin       03/22/2015 new pt consultation/ Wert   Chief Complaint  Patient presents with  . Pulmonary Consult    Referred by Dr. Zada Girt.  Pt states that she was dxed with PNA 03/14/15. She c/o low grade fever and cough-non prod.     tmax 101.9 was the day after Zmax started  Cough better >  Scant yellow mucus started  New R cp started 03/21/15 / has not tried nsaids Mild nausea since 9/4 poor appetite, no vomiting rec  Try advil 200 mg three with meals as needed for fever or pain (tylenol can be used to supplement if needed)  Return first of the week for follow up cxr      03/27/2015 f/u ov/Wert re: cap with R pleuritic cp  Chief Complaint  Patient presents with  . Follow-up    CXR done today. Pt states cough is unchanged "but does not hurt" - still non prod.   much better w/in 24 h of starting advil with no more fever, no more R pleuritic cp, still dry hacking cough  rec Please remember to go  to the lab  department downstairs for your tests - we will call you with the results when they are available. Change the advil to where you take it only as needed for pain.  Take delsym two tsp every 12 hours and supplement if needed with  tramadol 50 mg up to 1 every 4 hours to suppress the urge to cough. Swallowing water or using ice chips/non mint and menthol containing candies (such as lifesavers or sugarless jolly ranchers) are also  effective.  You should rest your voice and avoid activities that you know make you cough. Once you have eliminated the cough for 3 straight days try reducing the tramadol first,  then the delsym as tolerated.  No work Aug 31'st to 04/03/15     04/03/2015 f/u extended ov/Wert re: CAP with R parapneumonic effusion  Chief Complaint  Patient presents with  . Follow-up    Pt c/o feeling weak today. She states that she fainted this am at home.  She states that she has also been having muscle weakness and arthritic like pain in her hands.    still taking advil 200 x 3 tid and felt great one day prior to OV  Then worse since am 04/03/2015  On 03/31/15 developed L sided pleuritic cp > augmentin started and L chest pain improved until woke up with worse pain on L same site am of ov Min cough, no chills, no fever, min sweats   No obvious day to day or daytime variability or assoc   chest tightness, subjective wheeze or overt sinus or hb symptoms. No unusual exp hx or h/o childhood pna/ asthma or knowledge of premature birth.  Sleeping ok without nocturnal  or early am exacerbation  of respiratory  c/o's or need for noct saba. Also denies any obvious fluctuation of symptoms with weather or environmental changes or other aggravating or alleviating factors except as outlined above   Current Medications, Allergies, Complete Past Medical History, Past Surgical History, Family History, and Social History were reviewed in [employer] record.  ROS  The following are not active complaints unless bolded sore throat, dysphagia, dental problems, itching, sneezing,  nasal congestion or excess/ purulent secretions, ear ache,   fever, chills, sweats, unintended wt loss,  exertional cp, hemoptysis,  orthopnea pnd or leg swelling, presyncope, palpitations, abdominal pain, anorexia, nausea, vomiting, diarrhea  or change in bowel or bladder habits, change in stools or urine, dysuria,hematuria,  rash,  arthralgias R wrist , visual complaints, headache, numbness, weakness or ataxia or problems with walking or coordination,  change in mood/affect or memory.             Objective:   Physical Exam   Uncomfortable but certainly not toxic wf in wc/ able to get up on table from chair s difficulty  / min orthostatic changes   04/03/2015       130   Wt Readings from Last 3 Encounters:  03/22/15 133 lb 6.4 oz (60.51 kg)  03/18/15 133 lb 9.6 oz (60.6 kg)  04/12/14 135 lb (61.236 kg)    Vital signs reviewed   HEENT: nl dentition, turbinates, and orophanx. Nl external ear canals without cough reflex   NECK :  without JVD/Nodes/TM/ nl carotid upstrokes bilaterally   LUNGS: no acc muscle use, decreased bs R base, no rub, no classic bronchial changes or egophony    CV:  RRR  no s3 or murmur or increase in P2, no edema   ABD:  soft and nontender with nl excursion in the supine position. No bruits or organomegaly, bowel sounds nl  MS:  warm without deformities, Pos tenderness over radial wrist area/ no erythema  SKIN: warm and dry without lesions    NEURO:  alert, approp, no deficits- no pronator drift         I personally reviewed images and agree with radiology impression as follows:  CXR:  04/03/2015  1. Persistent right lower lobe infiltrate. 2. Small residua right pleural effusion. No no pneumothorax post thoracentesis. 3. Left lung base subsegmental atelectasis. Small left pleural Effusion.  Labs ordered/ reviewed:    Lab 04/03/15 1122  NA 132*  K 3.7  CL 95*  CO2 27  BUN 10  CREATININE 0.53  GLUCOSE 151*    Recent Labs Lab 04/03/15 1122  HGB 11.7*  HCT 35.1*  WBC 25.9 Repeated and verified X2.*  PLT 780.0*          Lab Results  Component Value Date   ESRSEDRATE 117* 04/03/2015   ESRSEDRATE 115* 03/27/2015   ESRSEDRATE 113* 03/22/2015             Assessment & Plan:

## 2015-04-03 NOTE — Patient Instructions (Addendum)
Please see patient coordinator before you leave today  to schedule repeat CTangiogram asap   Please remember to go to the lab   department downstairs for your tests - we will call you with the results when they are available.  If condition worsens go to ER

## 2015-04-04 ENCOUNTER — Encounter (HOSPITAL_COMMUNITY): Payer: Self-pay | Admitting: Pulmonary Disease

## 2015-04-04 DIAGNOSIS — I480 Paroxysmal atrial fibrillation: Secondary | ICD-10-CM

## 2015-04-04 DIAGNOSIS — I10 Essential (primary) hypertension: Secondary | ICD-10-CM

## 2015-04-04 DIAGNOSIS — M1990 Unspecified osteoarthritis, unspecified site: Secondary | ICD-10-CM

## 2015-04-04 LAB — MRSA PCR SCREENING: MRSA by PCR: NEGATIVE

## 2015-04-04 LAB — EXPECTORATED SPUTUM ASSESSMENT W GRAM STAIN, RFLX TO RESP C

## 2015-04-04 LAB — HIV ANTIBODY (ROUTINE TESTING W REFLEX): HIV Screen 4th Generation wRfx: NONREACTIVE

## 2015-04-04 LAB — TROPONIN I
Troponin I: 0.18 ng/mL — ABNORMAL HIGH (ref <0.031)
Troponin I: 0.14 ng/mL — ABNORMAL HIGH (ref <0.031)

## 2015-04-04 LAB — SEDIMENTATION RATE: SED RATE: 134 mm/h — AB (ref 0–22)

## 2015-04-04 LAB — C-REACTIVE PROTEIN: CRP: 18.7 mg/dL — AB (ref <1.0)

## 2015-04-04 LAB — STREP PNEUMONIAE URINARY ANTIGEN: STREP PNEUMO URINARY ANTIGEN: NEGATIVE

## 2015-04-04 MED ORDER — BUTAMBEN-TETRACAINE-BENZOCAINE 2-2-14 % EX AERO: 1.0000 | INHALATION_SPRAY | Freq: Once | CUTANEOUS | Status: DC

## 2015-04-04 MED ORDER — PHENYLEPHRINE HCL 0.25 % NA SOLN: 1.0000 | Freq: Four times a day (QID) | NASAL | Status: DC | PRN

## 2015-04-04 MED ORDER — LIDOCAINE HCL 2 % EX GEL: 1.0000 "application " | Freq: Once | CUTANEOUS | Status: DC

## 2015-04-04 NOTE — Telephone Encounter (Signed)
Per CTa: Result Note     Call patient : Study is neg for blood clot, no change rx needed - ov in one week unless condition worsens in meatime > to ER as per instructions   Per labs: Result Note     Call patient : Studies are as expected, no change in recs / no role for steroids to treat the lung or the tendons per review with literature on levaquin side effects  ---  Pt aware of results. She reports she ended up going to ED last night and being admitted for HCAP and sepsis. FYI for MW

## 2015-04-04 NOTE — Consult Note (Signed)
Name: [name] [name] MRN: [id number] DOB: 1950-12-25    ADMISSION DATE:  04/03/2015 CONSULTATION DATE:  04/04/15  REFERRING MD :  Dr. Maryland Pink   CHIEF COMPLAINT:  PNA / Pleural Effusions   BRIEF PATIENT DESCRIPTION: 64 y/o F with PMH of recent PNA requiring hospitalization (9/4-9/7), pleural effusion s/p thora on 9/15 (200 ml exudative fluid, WBC 2200) who was admitted 9/20 with cough, shortness of breath and malaise.  She was seen in the Pulmonary office on 9/20 and was evaluated with a CXR / CT Scan.  She also had a syncopal episode prior to admit while on the toliet.  PCCM consulted for evaluation 9/21.   SIGNIFICANT EVENTS  8/31  Seen by PCP, dx with PNA, CXR c/w RML infiltrate, treated with Zpak 9/4 - 9/7  Admit with PNA, completed 7 days levaquin 9/15  Thoracentesis >> exudative fluid, 200 ml, WBC 2200, nml glucose and negative cytology 9/17  Rx from office for Augmentin   STUDIES:  9/20  CTA Chest >> neg for PE, dense RML airspace disease with air bronchogram's.     HISTORY OF PRESENT ILLNESS:  64 y/o F, never smoker, with a past medical history of hyperlipidemia, osteoporosis, hip fracture S/P right hip arthroplasty (11/2013) and recent admission for pneumonia from 9/4-9/7 (initially on Vanco and Zosyn, transitioned to [place] for 7 days total) who presented to [facility] on 9/20 with complaints of cough, shortness of breath and malaise.  The patient was in her usual state of health the week prior to Labor Day 2016. She hiked the [place] [facility] without difficulty. By the end of the month (8/31 she had developed a progressive nonproductive cough and fever.  She was seen by her PCP and a chest x-ray was evaluated which showed a new right middle lobe pneumonia. She was treated with a Z-Pak.  By 9/4, she had developed weakness, decreased appetite and continued to have intermittent fevers (Tmax 101.9).  She was admitted to hospital from 9/4-9/7 and was  initially treated with vancomycin and Zosyn.  She was transitioned to Levaquin completing 7 days total of anti-biotics (completed 9/11).  Prior to that admission she had experienced a syncopal episode.  After discharge the patient reports she did not have improvement in symptoms. She was seen by her PCP again and referred to Dr. Melvyn Novas on 9/8.  Chest x-ray at that time showed a worsening of right middle lobe and anterior pneumonia.  She followed up with Dr. Melvyn Novas again on 9/13 with complaints of pleuritic chest pain.  WBCs at that time were noted to be 22.1 and patient reported ongoing fever. CT of the chest was ordered for 9/14 which showed concern for an evolving parapneumonic effusion on the right.  She underwent a right thoracentesis on 9/15 which yielded 200 mL of fluid consistent with an exudate (WBC 2200, normal glucose, negative cytology). She was instructed to begin Augmentin and Advil for pleuritic chest pain (9/17).  She was instructed to return to the emergency room if worsening of symptoms.  Notes reflect that the patient reported fever to 102 in a.m. of 9/16. She followed up with Dr. Melvyn Novas again on 9/20 with complaints of weakness and syncopal episode at home. She also noted that she had developed significant muscle weakness and arthritic-like pain in her hands with swelling. The patient noted that she had to lift her legs to climb stairs which was unusual. WBCs 9/20 25.9.  The patient reports she had  no improvement in symptoms on 9/20 and she reported to the emergency room for further evaluation. Repeat CT angiogram that time was negative for pulmonary embolism but demonstrated right middle lobe dense pneumonia with air bronchograms and small bilateral pleural effusions.  Prior to admit 9/20, she experienced another syncopal episode while on the toilet.   Currently she reports chronic dry cough. She denies significant sputum production. During interview was noted the patient has frequent clearing of her  throat but she denies GERD symptoms. She indicates anorexia with illness and 5 pound weight loss. She states she is up to date on cancer screenings to include colonoscopy, mammograms and GYN exam.  Lab workup - troponin 0.18, lactic acid 1.5, creatinine 0.53, WBC 25.9 with 92% neutrophils, hemoglobin 11.7, platelets 780, and ESR 117. PCCM consulted for evaluation.   PAST MEDICAL HISTORY :   has a past medical history of Atrophic vaginitis; Elevated cholesterol; Osteoporosis (2015); Pneumonia (03/2015); and Pleural effusion (9/206).  has past surgical history that includes Finger surgery and Hip Arthroplasty (Right, 11/14/2013).   Prior to Admission medications   Medication Sig Start Date End Date Taking? Authorizing Provider  amoxicillin-clavulanate (AUGMENTIN) 875-125 MG per tablet Take 1 tablet by mouth 2 (two) times daily. 03/31/15  Yes Tanda Rockers, MD  Dextromethorphan-Menthol [facility] COUGH RELIEF MT) Use as directed 5 mLs in the mouth or throat every 6 (six) hours as needed (cough/cold).   Yes Historical Provider, MD  ibuprofen (ADVIL,MOTRIN) 200 MG tablet Take 600 mg by mouth every 8 (eight) hours as needed for fever, headache or moderate pain.    Yes Historical Provider, MD  traMADol (ULTRAM) 50 MG tablet Take 1-2 tablets by mouth every 4 (four) hours as needed. pain 03/27/15  Yes Historical Provider, MD  levofloxacin (LEVAQUIN) 750 MG tablet Take 1 tablet by mouth daily. 5 days 03/21/15-03/25/15 03/21/15   Historical Provider, MD   Allergies  Allergen Reactions  . Levaquin [Levofloxacin]     Joint pain/soreness, pain in hands, calf's.  . Cephalexin Rash    FAMILY HISTORY:  family history includes Heart attack (age of onset: 34) in her father.   SOCIAL HISTORY:  reports that she has never smoked. She has never used smokeless tobacco. She reports that she does not drink alcohol or use illicit drugs.  REVIEW OF SYSTEMS:   Constitutional: Negative for malaise/fatigue and diaphoresis.  Reports fevers, chills, 5 lb wt loss HENT: Negative for hearing loss, ear pain, nosebleeds, congestion, sore throat, neck pain, tinnitus and ear discharge.   Eyes: Negative for blurred vision, double vision, photophobia, pain, discharge and redness.  Respiratory: Negative for hemoptysis, sputum production, wheezing and stridor. Reports non-productive cough, pleuritic chest pain bilaterally posterior, mild shortness of breath  Cardiovascular: Negative for chest pain, palpitations, orthopnea, claudication, leg swelling and PND.  Gastrointestinal: Negative for heartburn, nausea, vomiting, abdominal pain, diarrhea, constipation, blood in stool and melena.  Genitourinary: Negative for dysuria, urgency, frequency, hematuria and flank pain.  Musculoskeletal: Negative for myalgias, back pain, joint pain and falls.  Skin: Negative for itching and rash.  Neurological: Negative for dizziness, tingling, tremors, sensory change, speech change, focal weakness, seizures, weakness and headaches. Reports two prior episodes of syncope.   Endo/Heme/Allergies: Negative for environmental allergies and polydipsia. Does not bruise/bleed easily.  SUBJECTIVE:   VITAL SIGNS: Temp:  [97.6 F (36.4 C)-100.9 F (38.3 C)] 97.6 F (36.4 C) (09/21 0910) Pulse Rate:  [93-174] 97 (09/21 0910) Resp:  [15-29] 18 (09/21 0910) BP: (89-144)/(49-105) 112/67 mmHg (  09/21 0910) SpO2:  [90 %-97 %] 97 % (09/21 0910)  PHYSICAL EXAMINATION: General:  wdwn adult female in NAD  Neuro:  AAOx4, speech clear, MAE HEENT:  MM pink/moist, no jvd Cardiovascular:  s1s2 rrr, no m/r/g Lungs:  resp's even/non-labored on RA, crackles on R posterior / lateral  Abdomen:  NTND, bsx4 active. Musculoskeletal:  No acute deformities, swelling noted in hands, good ROM Skin:  No rashes / lesions   Recent Labs Lab 04/03/15 1122  NA 132*  K 3.7  CL 95*  CO2 27  BUN 10  CREATININE 0.53  GLUCOSE 151*    Recent Labs Lab 04/03/15 1122  HGB  11.7*  HCT 35.1*  WBC 25.9 Repeated and verified X2.*  PLT 780.0*   Dg Chest 2 View  04/03/2015   CLINICAL DATA:  Follow-up thoracentesis.  Right pleural effusion.  EXAM: CHEST  2 VIEW  COMPARISON:  03/29/2015.  FINDINGS: Mediastinum hilar structures normal. Persistent right lower lobe infiltrate. Small right pleural effusion. No pneumothorax. Left lung base subsegmental atelectasis . Small left pleural effusion present. Heart size normal. No acute bony abnormality.  IMPRESSION: 1. Persistent right lower lobe infiltrate. 2. Small residua right pleural effusion. No no pneumothorax post thoracentesis. 3. Left lung base subsegmental atelectasis. Small left pleural effusion.   Electronically Signed   By: Marcello Moores  Register   On: 04/03/2015 09:53   Ct Angio Chest W/cm &/or Wo Cm  04/03/2015   CLINICAL DATA:  Shortness of breath, cough.  EXAM: CT ANGIOGRAPHY CHEST WITH CONTRAST  TECHNIQUE: Multidetector CT imaging of the chest was performed using the standard protocol during bolus administration of intravenous contrast. Multiplanar CT image reconstructions and MIPs were obtained to evaluate the vascular anatomy.  CONTRAST:  141m OMNIPAQUE IOHEXOL 350 MG/ML SOLN  COMPARISON:  Chest radiograph of same day. CT scan of March 28, 2015.  FINDINGS: No pneumothorax is noted. Small bilateral pleural effusions are noted posteriorly. Stable right middle lobe opacity is noted with air bronchograms concerning for pneumonia. Mild bilateral posterior basilar subsegmental atelectasis is noted. Atelectasis or inflammation is noted anteriorly in the lingular segment of the left upper lobe. Stable opacity is noted medially in the left upper lobe concerning for inflammation. Stable consolidation is noted in the retro hilar region of the right upper lobe.  There is no evidence of thoracic aortic dissection or aneurysm. There is no evidence of pulmonary embolus. Visualized portion of upper abdomen is unremarkable.  Review of the  MIP images confirms the above findings.  IMPRESSION: No evidence of pulmonary embolus.  Stable multifocal airspace opacities are noted, most prominently seen in the right middle lobe, consistent with pneumonia. Continued CT follow-up is recommended to ensure resolution and rule out underlying neoplasm.  Mild bilateral posterior basilar subsegmental atelectasis is noted. Small bilateral pleural effusions are noted posteriorly.   Electronically Signed   By: JMarijo Conception M.D.   On: 04/03/2015 14:51    ASSESSMENT / PLAN:  RML Infiltrate / PNA  Fever   Discussion - patient with RML infiltrate that has not cleared despite multiple rounds of antibiotics.  She works as a gResearch scientist (life sciences)and also [place]  DDx includes bacterial PNA (though not convinced), aspiration, concern for fungal infection (blastomycosis) with occupation and malignancy.    Plan: Continue broad spectrum antibiotics - vanco/zosyn, D2/x Plan for FOB in am 9/22 for sputum sampling  NPO after MN  Pre-Bronch orders placed Assess swallow evaluation with frequent throat  clearing  Pulmonary hygiene: IS Intermittent CXR    Bilateral Pleural Effusions - s/p R thoracentesis on 9/15, studies consistent with extudative effusion  Plan: Monitor size on CXR Consider repeat fluid sampling if increased in size.  Would repeat cytology if re-sampled.   Noe Gens, NP-C [facility] Pgr: [phone] or if no answer [phone] 04/04/2015, 11:09 AM   Attending:  I have seen and examined the patient with nurse practitioner/resident and agree with the note above.   Ms. Leeth has had a cough with a persistent infiltrate for several weeks now.  She gardens and her symptoms started after a hike.  Now she has weakness and joint redness and swelling.  Exam: lungs clear, good air movement; redness, swelling of wrist and MCP of R hand, some on left  CT images personally reviewed> dense consolidation  RML, scant effusion  Impression/Plan: Non-resolving pneumonia with trace effusions, ddx resistant bacterial infection vs fungal infection vs a primary inflammatory (autoimmune) type process. Joint redness and swelling raises concern for autoimmune conditions but could be paraneoplastic or crystal mediated > bronch tomorrow > check ANA, RF, ESR, CRP, CCP  Rest as above  Roselie Awkward, MD [facility] Pager: [phone] Cell: [phone] After 3pm or if no response, call [phone]

## 2015-04-04 NOTE — ED Notes (Signed)
Pt c/o pain to posterior back, worse with cough. Pt opted for Tylenol, pain 5/10.

## 2015-04-04 NOTE — Progress Notes (Signed)
PROGRESS NOTE  [name] [name] BJS:[id number] DOB: 03-May-1951 DOA: 04/03/2015 PCP: Criselda Peaches, MD  HPI/Recap of past 29 hours: 64 year old female with past medical history of recent hospitalization for right middle lobe pneumonia complicated by right pleural effusion status post thoracentesis. Despite multiple courses of IV and by mouth antibiotics, pneumonia has not resolved. Previous cultures and cytology negative for bacteria or malignancy.  Patient readmitted on 03/1997 for recurrent shortness of breath and worsening pneumonia.  On admission, patient noted to be in rapid A. fib, however she converted to normal sinus even prior to starting Cardizem  Patient seen after arrival to unit. Breathing comfortably on room air. Complains of bilateral hand swelling especially at metacarpals with tenderness. No erythema.  Assessment/Plan: Active Problems: Persistent bacterial lobar pneumonia: Appreciate pulmonary assistance. Fungal infection versus resistant bacteria versus autoimmune, especially in the setting of her hand swelling. Plan for bronchoscopy tomorrow. Checking CRP and ANA rheumatoid factor and sedimentation rate.  Atrial fibrillation: Echocardiogram done last admission last week notes normal atria and the size. Given that patient is currently in sinus rhythm, will monitor for now.   Code Status: Full code  Family Communication: No family present   Disposition Plan: Stable, will transfer to floor   Consultants:  Pulmonary  Procedures:  Plan bronchoscopy 9/22  Antibiotics:  IV Zosyn 9/20-present  IV vancomycin 9/20-present   Objective: BP 117/54 mmHg  Pulse 98  Temp(Src) 98 F (36.7 C) (Oral)  Resp 25  Ht 5' 6.5" (1.689 m)  Wt 60 kg (132 lb 4.4 oz)  BMI 21.03 kg/m2  SpO2 100%  LMP 07/14/1993 No intake or output data in the 24 hours ending 04/04/15 1429 Filed Weights   04/04/15 1400  Weight: 60 kg (132 lb 4.4 oz)    Exam:   General:  alert and oriented  3, no acute distress  Cardiovascular: Regular rate and rhythm, borderline tachycardia   Respiratory: mostly clear, decreased breath sounds bibasilar  Abdomen: soft, nontender, nondistended, positive bowel sounds  Musculoskeletal:   trace pitting edema, hands are swollen bilaterally at MCPs  Data Reviewed: Basic Metabolic Panel:  Recent Labs Lab 04/03/15 1122  NA 132*  K 3.7  CL 95*  CO2 27  GLUCOSE 151*  BUN 10  CREATININE 0.53  CALCIUM 9.1   Liver Function Tests: No results for input(s): AST, ALT, ALKPHOS, BILITOT, PROT, ALBUMIN in the last 168 hours. No results for input(s): LIPASE, AMYLASE in the last 168 hours. No results for input(s): AMMONIA in the last 168 hours. CBC:  Recent Labs Lab 04/03/15 1122  WBC 25.9 Repeated and verified X2.*  NEUTROABS 24.0*  HGB 11.7*  HCT 35.1*  MCV 93.5  PLT 780.0*   Cardiac Enzymes:    Recent Labs Lab 04/03/15 1122 04/03/15 1847 04/03/15 2048 04/04/15 0218 04/04/15 0846  CKTOTAL 33  --   --   --   --   TROPONINI  --  0.17* 0.18* 0.18* 0.14*   BNP (last 3 results) No results for input(s): BNP in the last 8760 hours.  ProBNP (last 3 results) No results for input(s): PROBNP in the last 8760 hours.  CBG: No results for input(s): GLUCAP in the last 168 hours.  Recent Results (from the past 240 hour(s))  Culture, body fluid-bottle     Status: None   Collection Time: 03/29/15  2:21 PM  Result Value Ref Range Status   Specimen Description FLUID PLEURAL  Final   Special Requests BOTTLES DRAWN AEROBIC AND ANAEROBIC 10CC  Final   Culture   Final    NO GROWTH 5 DAYS Performed at [facility]    Report Status 04/03/2015 FINAL  Final  Gram stain     Status: None   Collection Time: 03/29/15  2:21 PM  Result Value Ref Range Status   Specimen Description FLUID PLEURAL  Final   Special Requests NONE  Final   Gram Stain   Final    WBC PRESENT,BOTH PMN AND MONONUCLEAR NO ORGANISMS SEEN CYTOSPIN Performed at  [facility]    Report Status 03/29/2015 FINAL  Final  Blood culture (routine x 2)     Status: None (Preliminary result)   Collection Time: 04/03/15  8:00 PM  Result Value Ref Range Status   Specimen Description BLOOD BLOOD LEFT FOREARM  Final   Special Requests BOTTLES DRAWN AEROBIC AND ANAEROBIC 5CC  Final   Culture   Final    NO GROWTH < 24 HOURS Performed at [facility]    Report Status PENDING  Incomplete     Studies: No results found.  Scheduled Meds: . [START ON 04/05/2015] butamben-tetracaine-benzocaine  1 spray Topical Once  . diltiazem  20 mg Intravenous Once  . heparin  5,000 Units Subcutaneous 3 times per day  . lidocaine  1 application Topical Once  . piperacillin-tazobactam (ZOSYN)  IV  3.375 g Intravenous Q8H  . vancomycin  750 mg Intravenous Q12H    Continuous Infusions: . diltiazem (CARDIZEM) infusion Stopped (04/03/15 1912)     Time spent: 25 min  [facility] Pager [phone]. If 7PM-7AM, please contact night-coverage at [email or web address], password [facility] 04/04/2015, 2:29 PM  LOS: 1 day

## 2015-04-04 NOTE — ED Notes (Signed)
Bed: RR11 Expected date:  Expected time:  Means of arrival:  Comments: Rm 15

## 2015-04-04 NOTE — Telephone Encounter (Signed)
Pt is returning call from nurse for lab results. [phone]

## 2015-04-04 NOTE — ED Notes (Signed)
Pt transitioned to inpatient bed for comfort. Call bell at bedside. VSS. Pt A & O

## 2015-04-05 ENCOUNTER — Inpatient Hospital Stay (HOSPITAL_COMMUNITY): Payer: No Typology Code available for payment source

## 2015-04-05 ENCOUNTER — Encounter (HOSPITAL_COMMUNITY)
Admission: EM | Disposition: A | Discharge status: Home / Self Care | Payer: Self-pay | Source: Home / Self Care | Attending: Internal Medicine

## 2015-04-05 DIAGNOSIS — M25441 Effusion, right hand: Secondary | ICD-10-CM

## 2015-04-05 DIAGNOSIS — E785 Hyperlipidemia, unspecified: Secondary | ICD-10-CM

## 2015-04-05 DIAGNOSIS — M25442 Effusion, left hand: Secondary | ICD-10-CM

## 2015-04-05 HISTORY — PX: VIDEO BRONCHOSCOPY: SHX5072

## 2015-04-05 LAB — BASIC METABOLIC PANEL
ANION GAP: 11 (ref 5–15)
BUN: 6 mg/dL (ref 6–20)
CO2: 28 mmol/L (ref 22–32)
Calcium: 8.7 mg/dL — ABNORMAL LOW (ref 8.9–10.3)
Chloride: 100 mmol/L — ABNORMAL LOW (ref 101–111)
Creatinine, Ser: 0.62 mg/dL (ref 0.44–1.00)
GFR calc Af Amer: >60 mL/min (ref >60)
Glucose, Bld: 106 mg/dL — ABNORMAL HIGH (ref 65–99)
POTASSIUM: 3.4 mmol/L — AB (ref 3.5–5.1)
SODIUM: 139 mmol/L (ref 135–145)

## 2015-04-05 LAB — LEGIONELLA PNEUMOPHILA SEROGP 1 UR AG: L. PNEUMOPHILA SEROGP 1 UR AG: NEGATIVE

## 2015-04-05 LAB — CBC
HEMATOCRIT: 33.6 % — AB (ref 36.0–46.0)
HEMOGLOBIN: 11 g/dL — AB (ref 12.0–15.0)
MCH: 30.7 pg (ref 26.0–34.0)
MCHC: 32.7 g/dL (ref 30.0–36.0)
MCV: 93.9 fL (ref 78.0–100.0)
Platelets: 732 10*3/uL — ABNORMAL HIGH (ref 150–400)
RBC: 3.58 MIL/uL — ABNORMAL LOW (ref 3.87–5.11)
RDW: 13.1 % (ref 11.5–15.5)
WBC: 23.9 10*3/uL — AB (ref 4.0–10.5)

## 2015-04-05 LAB — BODY FLUID CELL COUNT WITH DIFFERENTIAL
EOS FL: 16 %
Lymphs, Fluid: 9 %
MONOCYTE-MACROPHAGE-SEROUS FLUID: 30 % — AB (ref 50–90)
NEUTROPHIL FLUID: 45 % — AB (ref 0–25)
WBC FLUID: 360 uL (ref 0–1000)

## 2015-04-05 LAB — APTT: aPTT: 32 seconds (ref 24–37)

## 2015-04-05 LAB — PROTIME-INR
INR: 1.12 (ref 0.00–1.49)
PROTHROMBIN TIME: 14.6 s (ref 11.6–15.2)

## 2015-04-05 SURGERY — VIDEO BRONCHOSCOPY WITHOUT FLUORO
Anesthesia: Moderate Sedation | Laterality: Bilateral

## 2015-04-05 MED ORDER — FENTANYL CITRATE (PF) 100 MCG/2ML IJ SOLN
INTRAMUSCULAR | Status: AC
  Filled 2015-04-05: qty 4

## 2015-04-05 MED ORDER — LIDOCAINE HCL 1 % IJ SOLN
INTRAMUSCULAR | Status: DC | PRN
  Administered 2015-04-05: 10 mL

## 2015-04-05 MED ORDER — MIDAZOLAM HCL 10 MG/2ML IJ SOLN
INTRAMUSCULAR | Status: DC | PRN
  Administered 2015-04-05 (×2): 1 mg via INTRAVENOUS
  Administered 2015-04-05: 2 mg via INTRAVENOUS

## 2015-04-05 MED ORDER — SODIUM CHLORIDE 0.9 % IV SOLN
INTRAVENOUS | Status: DC
  Administered 2015-04-05: 11:00:00 via INTRAVENOUS

## 2015-04-05 MED ORDER — MIDAZOLAM HCL 5 MG/ML IJ SOLN
INTRAMUSCULAR | Status: AC
  Filled 2015-04-05: qty 2

## 2015-04-05 MED ORDER — FENTANYL CITRATE (PF) 100 MCG/2ML IJ SOLN
INTRAMUSCULAR | Status: DC | PRN
  Administered 2015-04-05 (×3): 25 ug via INTRAVENOUS

## 2015-04-05 NOTE — Progress Notes (Signed)
Video Bronchoscopy Done Intervention Bronchial washing  Intervention Bronchial biopsy  Procedure tolerated well 

## 2015-04-05 NOTE — Progress Notes (Signed)
PROGRESS NOTE  [name] [name] ZGY:[id number] DOB: Nov 15, 1950 DOA: 04/03/2015 PCP: Criselda Peaches, MD  HPI/Recap of past 26 hours: 64 year old female with past medical history of recent hospitalization for right middle lobe pneumonia complicated by right pleural effusion status post thoracentesis. Despite multiple courses of IV and by mouth antibiotics, pneumonia has not resolved. Previous cultures and cytology negative for bacteria or malignancy.  Patient readmitted on 03/1997 for recurrent shortness of breath and worsening pneumonia.  On admission, patient noted to be in rapid A. fib, however she converted to normal sinus even prior to starting Cardizem  Since admission, patient has had issues with joint pain and swelling which is new including her MCP, DIP, PIP, wrist, shoulders and now right hip. CRP and. sedimentation rate elevated. No events overnight. White blood cell count this morning still elevated despite several days of IV antibiotics now. Patient underwent bronchoscopy by pulmonary with results pending.  Patient seen status post bronchoscopy with only complaint is of multiple joint pain  Assessment/plan:  Persistent bacterial lobar pneumonia: Appreciate pulmonary assistance. Fungal infection versus resistant bacteria versus autoimmune, especially in the setting of her hand swelling. Highly suspicious for autoimmune process given rheumatologic issues  Atrial fibrillation: Echocardiogram done last admission last week notes normal atria and the size. Given that patient is currently in sinus rhythm, will monitor for now.  Joint pain: Highly suspicious given multiple sites, acute process and elevated sedimentation rate plus CRP. Given concerns for active infection, we'll hold off for starting steroids until bronchoscopy results confirm no infection  Code Status: Full code  Family Communication: Friend present  Disposition Plan: Stable, continue inpatient until etiology  discovered   Consultants:  Pulmonary  Procedures:  Bronchoscopy done 9/22: Results pending  Antibiotics:  IV Zosyn 9/20-present  IV vancomycin 9/20-present   Objective: BP 105/57 mmHg  Pulse 68  Temp(Src) 97.8 F (36.6 C) (Oral)  Resp 24  Ht 5' 6.5" (1.689 m)  Wt 60 kg (132 lb 4.4 oz)  BMI 21.03 kg/m2  SpO2 94%  LMP 07/14/1993  Intake/Output Summary (Last 24 hours) at 04/05/15 1618 Last data filed at 04/05/15 0900  Gross per 24 hour  Intake      0 ml  Output      0 ml  Net      0 ml   Filed Weights   04/04/15 1400  Weight: 60 kg (132 lb 4.4 oz)    Exam:   General:  alert and oriented 3, no acute distress  Cardiovascular: Regular rate and rhythm, S1-S2  Respiratory: mostly clear, decreased breath sounds bibasilar  Abdomen: soft, nontender, nondistended, positive bowel sounds  Musculoskeletal:   trace pitting edema, hands and fingers are swollen and tender, no erythema  Data Reviewed: Basic Metabolic Panel:  Recent Labs Lab 04/03/15 1122 04/05/15 0450  NA 132* 139  K 3.7 3.4*  CL 95* 100*  CO2 27 28  GLUCOSE 151* 106*  BUN 10 6  CREATININE 0.53 0.62  CALCIUM 9.1 8.7*   Liver Function Tests: No results for input(s): AST, ALT, ALKPHOS, BILITOT, PROT, ALBUMIN in the last 168 hours. No results for input(s): LIPASE, AMYLASE in the last 168 hours. No results for input(s): AMMONIA in the last 168 hours. CBC:  Recent Labs Lab 04/03/15 1122 04/05/15 0450  WBC 25.9 Repeated and verified X2.* 23.9*  NEUTROABS 24.0*  --   HGB 11.7* 11.0*  HCT 35.1* 33.6*  MCV 93.5 93.9  PLT 780.0* 732*   Cardiac Enzymes:  Recent Labs Lab 04/03/15 1122 04/03/15 1847 04/03/15 2048 04/04/15 0218 04/04/15 0846  CKTOTAL 33  --   --   --   --   TROPONINI  --  0.17* 0.18* 0.18* 0.14*   BNP (last 3 results) No results for input(s): BNP in the last 8760 hours.  ProBNP (last 3 results) No results for input(s): PROBNP in the last 8760  hours.  CBG: No results for input(s): GLUCAP in the last 168 hours.  Recent Results (from the past 240 hour(s))  Culture, body fluid-bottle     Status: None   Collection Time: 03/29/15  2:21 PM  Result Value Ref Range Status   Specimen Description FLUID PLEURAL  Final   Special Requests BOTTLES DRAWN AEROBIC AND ANAEROBIC 10CC  Final   Culture   Final    NO GROWTH 5 DAYS Performed at [facility]    Report Status 04/03/2015 FINAL  Final  Gram stain     Status: None   Collection Time: 03/29/15  2:21 PM  Result Value Ref Range Status   Specimen Description FLUID PLEURAL  Final   Special Requests NONE  Final   Gram Stain   Final    WBC PRESENT,BOTH PMN AND MONONUCLEAR NO ORGANISMS SEEN CYTOSPIN Performed at [facility]    Report Status 03/29/2015 FINAL  Final  Blood culture (routine x 2)     Status: None (Preliminary result)   Collection Time: 04/03/15  8:00 PM  Result Value Ref Range Status   Specimen Description BLOOD BLOOD LEFT FOREARM  Final   Special Requests BOTTLES DRAWN AEROBIC AND ANAEROBIC 5CC  Final   Culture   Final    NO GROWTH 2 DAYS Performed at [facility]    Report Status PENDING  Incomplete  MRSA PCR Screening     Status: None   Collection Time: 04/04/15  1:52 PM  Result Value Ref Range Status   MRSA by PCR NEGATIVE NEGATIVE Final    Comment:        The GeneXpert MRSA Assay (FDA approved for NASAL specimens only), is one component of a comprehensive MRSA colonization surveillance program. It is not intended to diagnose MRSA infection nor to guide or monitor treatment for MRSA infections.   Culture, sputum-assessment     Status: None   Collection Time: 04/04/15  2:29 PM  Result Value Ref Range Status   Specimen Description SPUTUM  Final   Special Requests NONE  Final   Sputum evaluation   Final    THIS SPECIMEN IS ACCEPTABLE. RESPIRATORY CULTURE REPORT TO FOLLOW.   Report Status 04/04/2015 FINAL  Final  Culture,  respiratory (NON-Expectorated)     Status: None (Preliminary result)   Collection Time: 04/04/15  2:29 PM  Result Value Ref Range Status   Specimen Description SPUTUM  Final   Special Requests NONE  Final   Gram Stain   Final    ABUNDANT WBC PRESENT,BOTH PMN AND MONONUCLEAR NO SQUAMOUS EPITHELIAL CELLS SEEN NO ORGANISMS SEEN Performed at [facility]    Culture PENDING  Incomplete   Report Status PENDING  Incomplete     Studies: Dg Chest Port 1 View  04/05/2015   CLINICAL DATA:  Post right lower lobe biopsy  EXAM: PORTABLE CHEST 1 VIEW  COMPARISON:  04/03/2015  FINDINGS: Cardiomediastinal silhouette is stable. Persistent patchy consolidation right base. Probable small bilateral pleural effusion. No pneumothorax. Persistent mild right suprahilar adenopathy.  IMPRESSION: Persistent patchy consolidation right base. Probable small  bilateral pleural effusion. No pneumothorax.   Electronically Signed   By: Lahoma Crocker M.D.   On: 04/05/2015 12:08   Dg C-arm Bronchoscopy  04/05/2015   CLINICAL DATA:    C-ARM BRONCHOSCOPY  Fluoroscopy was utilized by the requesting physician.  No radiographic  interpretation.     Scheduled Meds: . diltiazem  20 mg Intravenous Once  . heparin  5,000 Units Subcutaneous 3 times per day  . piperacillin-tazobactam (ZOSYN)  IV  3.375 g Intravenous Q8H  . vancomycin  750 mg Intravenous Q12H    Continuous Infusions: . sodium chloride 10 mL/hr at 04/05/15 1041  . diltiazem (CARDIZEM) infusion Stopped (04/03/15 1912)     Time spent: 15 min  [facility] Pager [phone]. If 7PM-7AM, please contact night-coverage at [email or web address], password [facility] 04/05/2015, 4:18 PM  LOS: 2 days

## 2015-04-05 NOTE — Progress Notes (Signed)
SLP Cancellation Note  Patient Details Name: [name] [name] MRN: [id number] DOB: June 27, 1951   Cancelled treatment:       Reason Eval/Treat Not Completed: Patient at procedure or test/unavailable.  Will re-attempt this afternoon if schedule permits, otherwise will return next date.   [name] 04/05/2015, 12:56 PM

## 2015-04-05 NOTE — Evaluation (Signed)
Clinical/Bedside Swallow Evaluation Patient Details  Name: [name] [name] MRN: [id number] Date of Birth: [birth year]  Today's Date: 04/05/2015 Time: SLP Start Time (ACUTE ONLY): 1448 SLP Stop Time (ACUTE ONLY): 1525 SLP Time Calculation (min) (ACUTE ONLY): 37 min  Past Medical History:  Past Medical History  Diagnosis Date  . Atrophic vaginitis   . Elevated cholesterol   . Osteoporosis 2015    based on hip fracture from fall  DEXA 2014 T score -2.1 FRAX 19%/1.7%  . Pneumonia 03/2015    R  . Pleural effusion 9/206    Bilateral effusions, s/p thora on R c/w exudate   Past Surgical History:  Past Surgical History  Procedure Laterality Date  . Finger surgery    . Hip arthroplasty Right 11/14/2013    Procedure: RIGHT CANNULATED HIP PINNING;  Surgeon: Alta Corning, MD;  Location: [facility];  Service: Orthopedics;  Laterality: Right;  BIOMET   HPI:  64 year old female with past medical history of recent hospitalization for right middle lobe pneumonia complicated by right pleural effusion status post thoracentesis. Despite multiple courses of IV and by mouth antibiotics, pneumonia has not resolved.  Underwent bronch 9/22.    Assessment / Plan / Recommendation Clinical Impression  Pt presents with intermittent, baseline cough throughout evaluation.  There is normal cranial nerve function; brisk swallow response; adequate timing of ventilatory-swallow sequence with appropriate exhalation post-swallow.  Pt is not aware of increased episodes of coughing associated with PO intake.  It is unlikely that dysphagia with aspiration is a contributing factor to diagnosis, but if warranted, MBS can be completed for more definitive answer.   [name]      Aspiration Risk  Mild    Diet Recommendation Age appropriate regular solids;Thin   Medication Administration: Whole meds with liquid    Other  Recommendations Oral Care  Recommendations: Oral care BID   Follow Up Recommendations       Frequency and Duration min 1 x/week  1 week            Swallow Study Prior Functional Status       General Other Pertinent Information: 64 year old female with past medical history of recent hospitalization for right middle lobe pneumonia complicated by right pleural effusion status post thoracentesis. Despite multiple courses of IV and by mouth antibiotics, pneumonia has not resolved Type of Study: Bedside swallow evaluation Previous Swallow Assessment: no Diet Prior to this Study: Regular;Thin liquids Temperature Spikes Noted: No Respiratory Status: Room air Behavior/Cognition: Alert;Cooperative;Pleasant mood Oral Cavity - Dentition: Adequate natural dentition/normal for age Self-Feeding Abilities: Able to feed self Patient Positioning: Upright in bed Baseline Vocal Quality: Normal Volitional Cough: Strong Volitional Swallow: Able to elicit    Oral/Motor/Sensory Function Overall Oral Motor/Sensory Function: Appears within functional limits for tasks assessed   Ice Chips Ice chips: Within functional limits Presentation: Spoon   Thin Liquid Thin Liquid: Within functional limits Presentation: Cup;Self Fed    Nectar Thick Nectar Thick Liquid: Not tested   Honey Thick Honey Thick Liquid: Not tested   Puree Puree: Within functional limits Presentation: [place]. [place], [place] CCC/SLP Pager [phone]     Solid: Within functional limits Presentation: Self Fed  Intermittent coughing noted throughout assessment.       [name] [name] 04/05/2015,3:38 PM

## 2015-04-05 NOTE — Procedures (Signed)
Video Bronchoscopy Procedure Note  Pre-Procedure Diagnoses: 1.  Right Middle Lobe Pneumonia  Consent:  Informed consent was obtained from the patient after discussing the risks and benefits of the procedure including bleeding, infection, pneumothorax, medication allergy, vocal chord injury, and potentially death.  Medications Administered:   1. Lidocaine 1% gargle 10cc 2. Lidocaine 1% 14cc via bronchoscope 3. Versed 4 mg IV 4. Fentanyl 75 g IV  Pre-Procedure Physical Exam: General:  No acute distress. Awake. Alert. ASA Class 3. HEENT:  Moist mucus membranes. No oral ulcers. Mallampati Class 3. Cardiovascular:  Tachycardic. No edema. No appreciable JVD. Pulmonary:  Good aeration bilaterally. Normal work of breathing on oxygen. Abdomen:  Soft. Nontender. Normal bowel sounds. Neurological:  Oriented to person, place, and time. Moving all 4 extremities equally.  Description of Procedure: Patient was brought back to the endoscopy procedure room.  A time out was performed to identify the correct patient and procedure.  Lidocaine gargle was performed.  Patient was laid recumbent and conscious sedation was administered by respiratory therapy.  Bite block was inserted and towel was placed over the patient's eyes.  Flexible bronchoscope was then inserted into the posterior pharynx until vocal chords were in full view. There was no abnormality of the vocal chords or arytenoids.  A total of 6cc of Lidocaine was used to anesthetize the vocal chords.  The bronchoscope was then inserted between the vocal chords with ease into the proximal trachea.  Lidocaine was then used to anesthetize the patient's proximal airways.  An airway inspeciton was performed finding a minimal amount of thin, clear secretions. There were no endobronchial lesions or masses.  The bronchoscope was then advanced into the lateral segment of the right middle lobe.  A lavage was performed by instilling a total of 180 cc of sterile saline  and aspirating a total of 70 cc of pink-tinged fluid.  A total of 4 transbronchial biopsies were performed under fluroscopy in the lateral segment of the right middlelobe.  No immediate pneumothorax was identified with fluroscopy.  The patient had no bleeding post biopsy. The flexible bronchoscope was then removed from the patient's airways after suctioning of the remaining secretions. The bite block was removed and the patient was returned to the upright position.  Blood Loss:  None.  Complications:  None.  Bronchoalveolar Lavage: 1. Right Middle Lobe:  Sent for cell count, Cytology, Aspergillus Antigen, Fungal smear & culture, AFB smear & culture, and routine culture.  Transbronchial Biopsy: 1. Performed on the Right Middle Lobe:  Sent for pathology and fungal culture.  Post Procedure Stat Portable CXR:  Ordered and pending.  Post Procedure Instructions: 1. Follow-up fluid cultures, Aspergillus antigen, & cytology. 2. Follow-up transbronchial biopsy pathology & fungal culture. 3. Patient will remain nothing by mouth for 1 hour post procedure and then nurse will perform bedside swallow evaluation prior to resuming previous diet.

## 2015-04-06 ENCOUNTER — Encounter (HOSPITAL_COMMUNITY): Payer: Self-pay | Admitting: Pulmonary Disease

## 2015-04-06 LAB — COMPREHENSIVE METABOLIC PANEL
ALBUMIN: 2.1 g/dL — AB (ref 3.5–5.0)
ALK PHOS: 262 U/L — AB (ref 38–126)
ALT: 22 U/L (ref 14–54)
ANION GAP: 10 (ref 5–15)
AST: 37 U/L (ref 15–41)
BILIRUBIN TOTAL: 0.5 mg/dL (ref 0.3–1.2)
BUN: 9 mg/dL (ref 6–20)
CALCIUM: 8.7 mg/dL — AB (ref 8.9–10.3)
CO2: 30 mmol/L (ref 22–32)
Chloride: 98 mmol/L — ABNORMAL LOW (ref 101–111)
Creatinine, Ser: 0.64 mg/dL (ref 0.44–1.00)
GLUCOSE: 131 mg/dL — AB (ref 65–99)
Potassium: 3.6 mmol/L (ref 3.5–5.1)
Sodium: 138 mmol/L (ref 135–145)
TOTAL PROTEIN: 6.3 g/dL — AB (ref 6.5–8.1)

## 2015-04-06 LAB — RHEUMATOID FACTOR: RHEUMATOID FACTOR: 81.6 [IU]/mL — AB (ref 0.0–13.9)

## 2015-04-06 LAB — ANTINUCLEAR ANTIBODIES, IFA: ANTINUCLEAR ANTIBODIES, IFA: NEGATIVE

## 2015-04-06 LAB — VANCOMYCIN, TROUGH: Vancomycin Tr: 10 ug/mL (ref 10.0–20.0)

## 2015-04-06 LAB — CYCLIC CITRUL PEPTIDE ANTIBODY, IGG/IGA: CCP Antibodies IgG/IgA: >250 units — ABNORMAL HIGH (ref 0–19)

## 2015-04-06 MED ORDER — ENSURE ENLIVE PO LIQD
237.0000 mL | Freq: Two times a day (BID) | ORAL | Status: DC
  Administered 2015-04-10: 237 mL via ORAL

## 2015-04-06 MED ORDER — MELOXICAM 15 MG PO TABS
15.0000 mg | ORAL_TABLET | Freq: Every day | ORAL | Status: DC
  Administered 2015-04-06 – 2015-04-10 (×5): 15 mg via ORAL
  Filled 2015-04-06 (×5): qty 1

## 2015-04-06 MED ORDER — VANCOMYCIN HCL IN DEXTROSE 750-5 MG/150ML-% IV SOLN
750.0000 mg | Freq: Three times a day (TID) | INTRAVENOUS | Status: DC
  Administered 2015-04-06 – 2015-04-08 (×7): 750 mg via INTRAVENOUS
  Filled 2015-04-06 (×7): qty 150

## 2015-04-06 NOTE — Progress Notes (Signed)
ANTIBIOTIC CONSULT NOTE - INITIAL  Pharmacy Consult for Vancomycin & Zosyn Indication: pneumonia  Allergies  Allergen Reactions  . Levaquin [Levofloxacin]     Joint pain/soreness, pain in hands, calf's.  . Cephalexin Rash    Patient Measurements: Height: 5' 6.5" (168.9 cm) Weight: 132 lb 4.4 oz (60 kg) IBW/kg (Calculated) : 60.45  Vital Signs: Temp: 98.1 F (36.7 C) (09/23 0527) Temp Source: Oral (09/23 0527) BP: 104/47 mmHg (09/22 2028) Pulse Rate: 96 (09/23 0527) Intake/Output from previous day: 09/22 0701 - 09/23 0700 In: 250 [IV Piggyback:250] Out: -  Intake/Output from this shift:    Labs:  Recent Labs  04/03/15 1122 04/05/15 0450  WBC 25.9 Repeated and verified X2.* 23.9*  HGB 11.7* 11.0*  PLT 780.0* 732*  CREATININE 0.53 0.62   Estimated Creatinine Clearance: 66.4 mL/min (by C-G formula based on Cr of 0.62). No results for input(s): VANCOTROUGH, VANCOPEAK, VANCORANDOM, GENTTROUGH, GENTPEAK, GENTRANDOM, TOBRATROUGH, TOBRAPEAK, TOBRARND, AMIKACINPEAK, AMIKACINTROU, AMIKACIN in the last 72 hours.   Microbiology: Recent Results (from the past 720 hour(s))  Blood culture (routine x 2)     Status: None   Collection Time: 03/18/15  7:39 PM  Result Value Ref Range Status   Specimen Description BLOOD LEFT ARM  Final   Special Requests BOTTLES DRAWN AEROBIC AND ANAEROBIC 5CC  Final   Culture   Final    NO GROWTH 5 DAYS Performed at [facility]    Report Status 03/24/2015 FINAL  Final  Blood culture (routine x 2)     Status: None   Collection Time: 03/18/15  8:27 PM  Result Value Ref Range Status   Specimen Description BLOOD RIGHT FOREARM  Final   Special Requests BOTTLES DRAWN AEROBIC AND ANAEROBIC 5ML  Final   Culture   Final    NO GROWTH 5 DAYS Performed at [facility]    Report Status 03/24/2015 FINAL  Final  C difficile quick scan w PCR reflex     Status: None   Collection Time: 03/20/15  6:58 PM  Result Value Ref Range Status   C  Diff antigen NEGATIVE NEGATIVE Final   C Diff toxin NEGATIVE NEGATIVE Final   C Diff interpretation Negative for toxigenic C. difficile  Final  Culture, body fluid-bottle     Status: None   Collection Time: 03/29/15  2:21 PM  Result Value Ref Range Status   Specimen Description FLUID PLEURAL  Final   Special Requests BOTTLES DRAWN AEROBIC AND ANAEROBIC 10CC  Final   Culture   Final    NO GROWTH 5 DAYS Performed at [facility]    Report Status 04/03/2015 FINAL  Final  Gram stain     Status: None   Collection Time: 03/29/15  2:21 PM  Result Value Ref Range Status   Specimen Description FLUID PLEURAL  Final   Special Requests NONE  Final   Gram Stain   Final    WBC PRESENT,BOTH PMN AND MONONUCLEAR NO ORGANISMS SEEN CYTOSPIN Performed at [facility]    Report Status 03/29/2015 FINAL  Final  Blood culture (routine x 2)     Status: None (Preliminary result)   Collection Time: 04/03/15  8:00 PM  Result Value Ref Range Status   Specimen Description BLOOD BLOOD LEFT FOREARM  Final   Special Requests BOTTLES DRAWN AEROBIC AND ANAEROBIC 5CC  Final   Culture   Final    NO GROWTH 2 DAYS Performed at [facility]    Report  Status PENDING  Incomplete  Blood culture (routine x 2)     Status: None (Preliminary result)   Collection Time: 04/03/15  8:00 PM  Result Value Ref Range Status   Specimen Description BLOOD RIGHT ANTECUBITAL  Final   Special Requests BOTTLES DRAWN AEROBIC ONLY 8ML  Final   Culture   Final    NO GROWTH 2 DAYS Performed at [facility]    Report Status PENDING  Incomplete  MRSA PCR Screening     Status: None   Collection Time: 04/04/15  1:52 PM  Result Value Ref Range Status   MRSA by PCR NEGATIVE NEGATIVE Final    Comment:        The GeneXpert MRSA Assay (FDA approved for NASAL specimens only), is one component of a comprehensive MRSA colonization surveillance program. It is not intended to diagnose MRSA infection nor to guide  or monitor treatment for MRSA infections.   Culture, sputum-assessment     Status: None   Collection Time: 04/04/15  2:29 PM  Result Value Ref Range Status   Specimen Description SPUTUM  Final   Special Requests NONE  Final   Sputum evaluation   Final    THIS SPECIMEN IS ACCEPTABLE. RESPIRATORY CULTURE REPORT TO FOLLOW.   Report Status 04/04/2015 FINAL  Final  Culture, respiratory (NON-Expectorated)     Status: None (Preliminary result)   Collection Time: 04/04/15  2:29 PM  Result Value Ref Range Status   Specimen Description SPUTUM  Final   Special Requests NONE  Final   Gram Stain   Final    ABUNDANT WBC PRESENT,BOTH PMN AND MONONUCLEAR NO SQUAMOUS EPITHELIAL CELLS SEEN NO ORGANISMS SEEN Performed at [facility]    Culture PENDING  Incomplete   Report Status PENDING  Incomplete    Medical History: Past Medical History  Diagnosis Date  . Atrophic vaginitis   . Elevated cholesterol   . Osteoporosis 2015    based on hip fracture from fall  DEXA 2014 T score -2.1 FRAX 19%/1.7%  . Pneumonia 03/2015    R  . Pleural effusion 9/206    Bilateral effusions, s/p thora on R c/w exudate    Medications:  Scheduled:  . diltiazem  20 mg Intravenous Once  . heparin  5,000 Units Subcutaneous 3 times per day  . piperacillin-tazobactam (ZOSYN)  IV  3.375 g Intravenous Q8H  . vancomycin  750 mg Intravenous Q12H   Infusions:  . sodium chloride 10 mL/hr at 04/05/15 1041  . diltiazem (CARDIZEM) infusion Stopped (04/03/15 1912)   Assessment: 64 yr female with recent h/o pneumonia presents with fever after recent thoracentesis.  Presents to ED with complaints of worsening symptoms. Pt found to be in AFib and ongoing pneumonia. Pharmacy consulted to dose Vancomycin and Zosyn for pneumonia.  9/20 >> Vanc >> 9/20 >> Zosyn >>    9/20 blood x2: ngtd 9/21 sputum: ngtd 9/20 HIV Ab: neg 9/21 urine strep/legionella: neg/neg 9/22 BAL: ngtd, AFB: sent,  Fungal: sent  Today,  04/06/2015:  Afeb, WBC elevated, SCr stable CrCl 66 Vanc trough = 10mg /l on 750mg  q12h.   Goal of Therapy:  Vancomycin trough level 15-20 mcg/ml  Appropriate antibiotic dosing for renal function; eradication of infection  Plan:  Increase Vancomycin to 750mg  IV q8h. Cont Zosyn 3.375g IV Q8H infused over 4hrs. Recheck Vanc trough at steady state. Follow up renal fxn, culture results, and clinical course.  Romeo Rabon, PharmD, pager [phone]. 04/06/2015,9:31 AM.

## 2015-04-06 NOTE — Progress Notes (Signed)
Speech Language Pathology Treatment:    Patient Details Name: Bryttany Tortorelli MRN: [id number] DOB: 06/12/1951 Today's Date: 04/06/2015 Time: 6256-3893 SLP Time Calculation (min) (ACUTE ONLY): 25 min  Assessment / Plan / Recommendation Clinical Impression  Skilled observation with minimal verbal cues required with regular/thin consistencies without overt s/s of aspiration noted; pt has intermittent cough, but this is consistent with or without po intake; pt had recent bronchoscopy and expressed interest in obtaining results before pursuing MBS; tolerating regular/thin diet with limited appetite noted and unproductive cough per pt.  Vocal quality good/strong cough noted during skilled observation as well.  Pt interested in possible supplement d/t weight loss (4-5 lbs) since Pna began approximately 3-4 weeks ago.  Discussed a possible nutrition (dietician) consult prn with pt.  MBS may be warranted pending bronchoscopy result and pt agreement as she stated today after education re: MBS, "I don't want any unnecessary tests, so I want to wait and see what the bronchoscopy results are before having another test."   HPI Other Pertinent Information: 64 year old female with past medical history of recent hospitalization for right middle lobe pneumonia complicated by right pleural effusion status post thoracentesis. Despite multiple courses of IV and by mouth antibiotics, pneumonia has not resolved   Pertinent Vitals Pain Assessment: No/denies pain  SLP Plan  Continue with current plan of care;Other (Comment) (MBS prn)    Recommendations Diet recommendations: Regular;Thin liquid Liquids provided via: Cup;Straw Medication Administration: Whole meds with liquid Supervision: Patient able to self feed Compensations: Slow rate;Small sips/bites Postural Changes and/or Swallow Maneuvers: Seated upright 90 degrees              Oral Care Recommendations: Oral care BID Plan: Continue with current plan of  care;Other (Comment) (MBS prn)         Vladislav Axelson,PAT, M.S., CCC-SLP 04/06/2015, 1:47 PM

## 2015-04-06 NOTE — Progress Notes (Signed)
Initial Nutrition Assessment  DOCUMENTATION CODES:   Not applicable  INTERVENTION:  Ensure Enlive po BID, each supplement provides 350 kcal and 20 grams of protein    NUTRITION DIAGNOSIS:   Inadequate oral intake related to poor appetite as evidenced by per patient/family report, meal completion < 50%.    GOAL:   Patient will meet greater than or equal to 90% of their needs    MONITOR:   PO intake, Supplement acceptance, Weight trends, Labs, I & O's  REASON FOR ASSESSMENT:   Malnutrition Screening Tool    ASSESSMENT:   64 yo female with ongoing hx of RML PNA complicated by Right PE that was drained. Multiple hospital admissions and rounds of ABx have failed to clear up. Cultures have not yielded an organism causing PNA. Pt is on multiple antibiotics now. Pt reports poor po intake and appetite related to ongoing illness. Received ensure enlive during previous stay, will order again. Pt exhibits no signs or symtpoms of malnutrition. Monitor supplement acceptance and PO intake.    Diet Order:  Diet Heart Room service appropriate?: Yes; Fluid consistency:: Thin  Skin:  Reviewed, no issues  Last BM:  9/22  Height:   Ht Readings from Last 1 Encounters:  04/04/15 5' 6.5" (1.689 m)    Weight:   Wt Readings from Last 1 Encounters:  04/04/15 132 lb 4.4 oz (60 kg)    Ideal Body Weight:  59 kg  BMI:  Body mass index is 21.03 kg/(m^2).  Estimated Nutritional Needs:   Kcal:  1500-1700  Protein:  60-70g  Fluid:  >=/ 2L  EDUCATION NEEDS:   No education needs identified at this time  [name] [name]. [name] Buckwalter, MS, RD LDN After Hours/Weekend Pager [phone]

## 2015-04-06 NOTE — Progress Notes (Signed)
PROGRESS NOTE  [name] [name] NLZ:[id number] DOB: 18-Apr-1951 DOA: 04/03/2015 PCP: Criselda Peaches, MD  HPI/Recap of past 63 hours: 64 year old female with past medical history of recent hospitalization for right middle lobe pneumonia complicated by right pleural effusion status post thoracentesis. Despite multiple courses of IV and by mouth antibiotics, pneumonia has not resolved. Previous cultures and cytology negative for bacteria or malignancy.  Patient readmitted on 03/1997 for recurrent shortness of breath and worsening pneumonia.  On admission, patient noted to be in rapid A. fib, however she converted to normal sinus even prior to starting Cardizem  Since admission, patient has had issues with joint pain and swelling which is new including her MCP, DIP, PIP, wrist, shoulders and now right hip. CRP and. sedimentation rate elevated. No events overnight. White blood cell count this morning still elevated despite several days of IV antibiotics now. Patient underwent bronchoscopy by pulmonary with results pending.  Patient seen status post bronchoscopy with only complaint is of multiple joint pain  Assessment/plan:  Persistent bacterial lobar pneumonia right-sided pleural effusion status post thoracentesis on 9/15: Readmitted 9/20, Appreciate pulmonary assistance. Status post bronchoscopy on 9/22, follow fluid culture, Aspergillus antigen and cytology, transbronchial biopsy results pending,  Fungal infection versus resistant bacteria versus autoimmune, especially in the setting of her hand swelling. Highly suspicious for autoimmune process given rheumatologic issues  Atrial fibrillation: Echocardiogram done last admission last week notes normal atria and the size. Given that patient is currently in sinus rhythm, will monitor for now.  Joint pain: Highly suspicious for rheumatoid arthritis given multiple sites, acute process and elevated sedimentation rate plus CRP. Anti-CCP antibody pending, Given  concerns for active infection, we'll hold off for starting steroids until bronchoscopy results confirm no infection  Code Status: Full code  Family Communication: Friend present  Disposition Plan: Anticipate discharge when okay with pulmonary   Consultants:  Pulmonary  Procedures:  Bronchoscopy done 9/22: Results pending  Antibiotics:  IV Zosyn 9/20-present  IV vancomycin 9/20-present   Objective: BP 104/47 mmHg  Pulse 96  Temp(Src) 98.1 F (36.7 C) (Oral)  Resp 20  Ht 5' 6.5" (1.689 m)  Wt 60 kg (132 lb 4.4 oz)  BMI 21.03 kg/m2  SpO2 94%  LMP 07/14/1993  Intake/Output Summary (Last 24 hours) at 04/06/15 1309 Last data filed at 04/06/15 1118  Gross per 24 hour  Intake    730 ml  Output      0 ml  Net    730 ml   Filed Weights   04/04/15 1400  Weight: 60 kg (132 lb 4.4 oz)    Exam:   General:  alert and oriented 3, no acute distress  Cardiovascular: Regular rate and rhythm, S1-S2  Respiratory: mostly clear, decreased breath sounds bibasilar  Abdomen: soft, nontender, nondistended, positive bowel sounds  Musculoskeletal:   trace pitting edema, hands and fingers are swollen and tender, no erythema  Data Reviewed: Basic Metabolic Panel:  Recent Labs Lab 04/03/15 1122 04/05/15 0450 04/06/15 0812  NA 132* 139 138  K 3.7 3.4* 3.6  CL 95* 100* 98*  CO2 27 28 30   GLUCOSE 151* 106* 131*  BUN 10 6 9   CREATININE 0.53 0.62 0.64  CALCIUM 9.1 8.7* 8.7*   Liver Function Tests:  Recent Labs Lab 04/06/15 0812  AST 37  ALT 22  ALKPHOS 262*  BILITOT 0.5  PROT 6.3*  ALBUMIN 2.1*   No results for input(s): LIPASE, AMYLASE in the last 168 hours. No results for input(s): AMMONIA  in the last 168 hours. CBC:  Recent Labs Lab 04/03/15 1122 04/05/15 0450  WBC 25.9 Repeated and verified X2.* 23.9*  NEUTROABS 24.0*  --   HGB 11.7* 11.0*  HCT 35.1* 33.6*  MCV 93.5 93.9  PLT 780.0* 732*   Cardiac Enzymes:    Recent Labs Lab 04/03/15 1122  04/03/15 1847 04/03/15 2048 04/04/15 0218 04/04/15 0846  CKTOTAL 33  --   --   --   --   TROPONINI  --  0.17* 0.18* 0.18* 0.14*   BNP (last 3 results) No results for input(s): BNP in the last 8760 hours.  ProBNP (last 3 results) No results for input(s): PROBNP in the last 8760 hours.  CBG: No results for input(s): GLUCAP in the last 168 hours.  Recent Results (from the past 240 hour(s))  Culture, body fluid-bottle     Status: None   Collection Time: 03/29/15  2:21 PM  Result Value Ref Range Status   Specimen Description FLUID PLEURAL  Final   Special Requests BOTTLES DRAWN AEROBIC AND ANAEROBIC 10CC  Final   Culture   Final    NO GROWTH 5 DAYS Performed at [facility]    Report Status 04/03/2015 FINAL  Final  Gram stain     Status: None   Collection Time: 03/29/15  2:21 PM  Result Value Ref Range Status   Specimen Description FLUID PLEURAL  Final   Special Requests NONE  Final   Gram Stain   Final    WBC PRESENT,BOTH PMN AND MONONUCLEAR NO ORGANISMS SEEN CYTOSPIN Performed at [facility]    Report Status 03/29/2015 FINAL  Final  Blood culture (routine x 2)     Status: None (Preliminary result)   Collection Time: 04/03/15  8:00 PM  Result Value Ref Range Status   Specimen Description BLOOD BLOOD LEFT FOREARM  Final   Special Requests BOTTLES DRAWN AEROBIC AND ANAEROBIC 5CC  Final   Culture   Final    NO GROWTH 2 DAYS Performed at [facility]    Report Status PENDING  Incomplete  Blood culture (routine x 2)     Status: None (Preliminary result)   Collection Time: 04/03/15  8:00 PM  Result Value Ref Range Status   Specimen Description BLOOD RIGHT ANTECUBITAL  Final   Special Requests BOTTLES DRAWN AEROBIC ONLY 8ML  Final   Culture   Final    NO GROWTH 2 DAYS Performed at [facility]    Report Status PENDING  Incomplete  MRSA PCR Screening     Status: None   Collection Time: 04/04/15  1:52 PM  Result Value Ref Range Status    MRSA by PCR NEGATIVE NEGATIVE Final    Comment:        The GeneXpert MRSA Assay (FDA approved for NASAL specimens only), is one component of a comprehensive MRSA colonization surveillance program. It is not intended to diagnose MRSA infection nor to guide or monitor treatment for MRSA infections.   Culture, sputum-assessment     Status: None   Collection Time: 04/04/15  2:29 PM  Result Value Ref Range Status   Specimen Description SPUTUM  Final   Special Requests NONE  Final   Sputum evaluation   Final    THIS SPECIMEN IS ACCEPTABLE. RESPIRATORY CULTURE REPORT TO FOLLOW.   Report Status 04/04/2015 FINAL  Final  Culture, respiratory (NON-Expectorated)     Status: None (Preliminary result)   Collection Time: 04/04/15  2:29 PM  Result  Value Ref Range Status   Specimen Description SPUTUM  Final   Special Requests NONE  Final   Gram Stain   Final    ABUNDANT WBC PRESENT,BOTH PMN AND MONONUCLEAR NO SQUAMOUS EPITHELIAL CELLS SEEN NO ORGANISMS SEEN Performed at [facility]    Culture   Final    NO GROWTH 1 DAY Performed at [facility]    Report Status PENDING  Incomplete  Culture, respiratory (NON-Expectorated)     Status: None (Preliminary result)   Collection Time: 04/05/15 11:41 AM  Result Value Ref Range Status   Specimen Description BRONCHIAL ALVEOLAR LAVAGE  Final   Special Requests Normal  Final   Gram Stain   Final    FEW WBC PRESENT,BOTH PMN AND MONONUCLEAR NO SQUAMOUS EPITHELIAL CELLS SEEN NO ORGANISMS SEEN Performed at [facility]    Culture   Final    NO GROWTH 1 DAY Performed at [facility]    Report Status PENDING  Incomplete  Fungus Culture with Smear     Status: None (Preliminary result)   Collection Time: 04/05/15 11:41 AM  Result Value Ref Range Status   Specimen Description BRONCHIAL ALVEOLAR LAVAGE  Final   Special Requests Normal  Final   Fungal Smear   Final    NO YEAST OR FUNGAL ELEMENTS SEEN Performed at  [facility]    Culture   Final    CULTURE IN PROGRESS FOR FOUR WEEKS Performed at [facility]    Report Status PENDING  Incomplete     Studies: No results found.  Scheduled Meds: . diltiazem  20 mg Intravenous Once  . heparin  5,000 Units Subcutaneous 3 times per day  . meloxicam  15 mg Oral Daily  . piperacillin-tazobactam (ZOSYN)  IV  3.375 g Intravenous Q8H  . vancomycin  750 mg Intravenous Q8H    Continuous Infusions: . sodium chloride 10 mL/hr at 04/05/15 1041  . diltiazem (CARDIZEM) infusion Stopped (04/03/15 1912)     Time spent: 15 min  [facility]  [facility] Pager [phone] If 7PM-7AM, please contact night-coverage at [email or web address], password [facility] 04/06/2015, 1:09 PM  LOS: 3 days

## 2015-04-07 LAB — CULTURE, RESPIRATORY W GRAM STAIN
Culture: NO GROWTH
Culture: NO GROWTH
Special Requests: NORMAL

## 2015-04-07 LAB — CBC
HCT: 33.5 % — ABNORMAL LOW (ref 36.0–46.0)
Hemoglobin: 10.8 g/dL — ABNORMAL LOW (ref 12.0–15.0)
MCH: 30.9 pg (ref 26.0–34.0)
MCHC: 32.2 g/dL (ref 30.0–36.0)
MCV: 96 fL (ref 78.0–100.0)
PLATELETS: 705 10*3/uL — AB (ref 150–400)
RBC: 3.49 MIL/uL — AB (ref 3.87–5.11)
RDW: 13.5 % (ref 11.5–15.5)
WBC: 20.3 10*3/uL — AB (ref 4.0–10.5)

## 2015-04-07 LAB — BASIC METABOLIC PANEL
Anion gap: 8 (ref 5–15)
BUN: 7 mg/dL (ref 6–20)
CHLORIDE: 100 mmol/L — AB (ref 101–111)
CO2: 30 mmol/L (ref 22–32)
CREATININE: 0.47 mg/dL (ref 0.44–1.00)
Calcium: 8.4 mg/dL — ABNORMAL LOW (ref 8.9–10.3)
Glucose, Bld: 115 mg/dL — ABNORMAL HIGH (ref 65–99)
POTASSIUM: 3.3 mmol/L — AB (ref 3.5–5.1)
SODIUM: 138 mmol/L (ref 135–145)

## 2015-04-07 LAB — CULTURE, RESPIRATORY

## 2015-04-07 MED ORDER — BENZONATATE 100 MG PO CAPS
200.0000 mg | ORAL_CAPSULE | Freq: Three times a day (TID) | ORAL | Status: DC | PRN
  Administered 2015-04-07 – 2015-04-10 (×8): 200 mg via ORAL
  Filled 2015-04-07 (×8): qty 2

## 2015-04-07 MED ORDER — POTASSIUM CHLORIDE CRYS ER 20 MEQ PO TBCR
40.0000 meq | EXTENDED_RELEASE_TABLET | Freq: Two times a day (BID) | ORAL | Status: AC
  Administered 2015-04-07 (×2): 40 meq via ORAL
  Filled 2015-04-07 (×2): qty 2

## 2015-04-07 NOTE — Progress Notes (Addendum)
PROGRESS NOTE  [name] [name] GXQ:[id number] DOB: 1951-03-09 DOA: 04/03/2015 PCP: Criselda Peaches, MD  HPI/Recap of past 43 hours: 64 year old female with past medical history of recent hospitalization for right middle lobe pneumonia complicated by right pleural effusion status post thoracentesis. Despite multiple courses of IV and by mouth antibiotics, pneumonia has not resolved. Previous cultures and cytology negative for bacteria or malignancy.  Patient readmitted on 03/1997 for recurrent shortness of breath and worsening pneumonia.  On admission, patient noted to be in rapid A. fib, however she converted to normal sinus even prior to starting Cardizem  Since admission, patient has had issues with joint pain and swelling which is new including her MCP, DIP, PIP, wrist, shoulders and now right hip. CRP and. sedimentation rate elevated. No events overnight. White blood cell count this morning still elevated despite several days of IV antibiotics now. Patient underwent bronchoscopy by pulmonary with results pending.  Patient seen status post bronchoscopy with only complaint is of multiple joint pain  Assessment/plan:  Persistent bacterial lobar pneumonia right-sided pleural effusion status post thoracentesis on 9/15: Readmitted 9/20, Appreciate pulmonary assistance. Status post bronchoscopy on 9/22, follow fluid culture, Aspergillus antigen and cytology, transbronchial biopsy results pending,  Fungal infection versus resistant bacteria versus autoimmune, especially in the setting of her hand swelling. Highly suspicious for autoimmune process given rheumatologic issues. Recommend ID consultation if cultures are negative, .   Atrial fibrillation: Echocardiogram done last admission last week notes normal atria and the size. Given that patient is currently in sinus rhythm, will monitor for now. Remains in good control.   Joint pain: Highly suspicious for rheumatoid arthritis given multiple sites, acute  process and elevated sedimentation rate plus CRP. Anti-CCP antibody pending, Given concerns for active infection, we'll hold off for starting steroids until bronchoscopy results confirm no infection. If all the cultures are negative , recommend ID consultation for recommendations  And possible outpatient rheumatology appointment.   Code Status: Full code  Family Communication: none present.   Disposition Plan: Anticipate discharge when okay with pulmonary   Consultants:  Pulmonary  Procedures:  Bronchoscopy done 9/22: Results pending  Antibiotics:  IV Zosyn 9/20-present  IV vancomycin 9/20-present   Objective: BP 127/54 mmHg  Pulse 94  Temp(Src) 99.1 F (37.3 C) (Oral)  Resp 18  Ht 5' 6.5" (1.689 m)  Wt 60 kg (132 lb 4.4 oz)  BMI 21.03 kg/m2  SpO2 98%  LMP 07/14/1993  Intake/Output Summary (Last 24 hours) at 04/07/15 1600 Last data filed at 04/07/15 0851  Gross per 24 hour  Intake    964 ml  Output      0 ml  Net    964 ml   Filed Weights   04/04/15 1400  Weight: 60 kg (132 lb 4.4 oz)    Exam:   General:  alert and oriented 3, no acute distress  Cardiovascular: Regular rate and rhythm, S1-S2  Respiratory: mostly clear, decreased breath sounds bibasilar  Abdomen: soft, nontender, nondistended, positive bowel sounds  Musculoskeletal:   trace pitting edema, hands and fingers are swollen and tender, no erythema  Data Reviewed: Basic Metabolic Panel:  Recent Labs Lab 04/03/15 1122 04/05/15 0450 04/06/15 0812 04/07/15 0340  NA 132* 139 138 138  K 3.7 3.4* 3.6 3.3*  CL 95* 100* 98* 100*  CO2 27 28 30 30   GLUCOSE 151* 106* 131* 115*  BUN 10 6 9 7   CREATININE 0.53 0.62 0.64 0.47  CALCIUM 9.1 8.7* 8.7* 8.4*   Liver  Function Tests:  Recent Labs Lab 04/06/15 0812  AST 37  ALT 22  ALKPHOS 262*  BILITOT 0.5  PROT 6.3*  ALBUMIN 2.1*   No results for input(s): LIPASE, AMYLASE in the last 168 hours. No results for input(s): AMMONIA in the  last 168 hours. CBC:  Recent Labs Lab 04/03/15 1122 04/05/15 0450 04/07/15 0913  WBC 25.9 Repeated and verified X2.* 23.9* 20.3*  NEUTROABS 24.0*  --   --   HGB 11.7* 11.0* 10.8*  HCT 35.1* 33.6* 33.5*  MCV 93.5 93.9 96.0  PLT 780.0* 732* 705*   Cardiac Enzymes:    Recent Labs Lab 04/03/15 1122 04/03/15 1847 04/03/15 2048 04/04/15 0218 04/04/15 0846  CKTOTAL 33  --   --   --   --   TROPONINI  --  0.17* 0.18* 0.18* 0.14*   BNP (last 3 results) No results for input(s): BNP in the last 8760 hours.  ProBNP (last 3 results) No results for input(s): PROBNP in the last 8760 hours.  CBG: No results for input(s): GLUCAP in the last 168 hours.  Recent Results (from the past 240 hour(s))  Culture, body fluid-bottle     Status: None   Collection Time: 03/29/15  2:21 PM  Result Value Ref Range Status   Specimen Description FLUID PLEURAL  Final   Special Requests BOTTLES DRAWN AEROBIC AND ANAEROBIC 10CC  Final   Culture   Final    NO GROWTH 5 DAYS Performed at [facility]    Report Status 04/03/2015 FINAL  Final  Gram stain     Status: None   Collection Time: 03/29/15  2:21 PM  Result Value Ref Range Status   Specimen Description FLUID PLEURAL  Final   Special Requests NONE  Final   Gram Stain   Final    WBC PRESENT,BOTH PMN AND MONONUCLEAR NO ORGANISMS SEEN CYTOSPIN Performed at [facility]    Report Status 03/29/2015 FINAL  Final  Blood culture (routine x 2)     Status: None (Preliminary result)   Collection Time: 04/03/15  8:00 PM  Result Value Ref Range Status   Specimen Description BLOOD BLOOD LEFT FOREARM  Final   Special Requests BOTTLES DRAWN AEROBIC AND ANAEROBIC 5CC  Final   Culture   Final    NO GROWTH 4 DAYS Performed at [facility]    Report Status PENDING  Incomplete  Blood culture (routine x 2)     Status: None (Preliminary result)   Collection Time: 04/03/15  8:00 PM  Result Value Ref Range Status   Specimen Description  BLOOD RIGHT ANTECUBITAL  Final   Special Requests BOTTLES DRAWN AEROBIC ONLY 8ML  Final   Culture   Final    NO GROWTH 4 DAYS Performed at [facility]    Report Status PENDING  Incomplete  MRSA PCR Screening     Status: None   Collection Time: 04/04/15  1:52 PM  Result Value Ref Range Status   MRSA by PCR NEGATIVE NEGATIVE Final    Comment:        The GeneXpert MRSA Assay (FDA approved for NASAL specimens only), is one component of a comprehensive MRSA colonization surveillance program. It is not intended to diagnose MRSA infection nor to guide or monitor treatment for MRSA infections.   Culture, sputum-assessment     Status: None   Collection Time: 04/04/15  2:29 PM  Result Value Ref Range Status   Specimen Description SPUTUM  Final  Special Requests NONE  Final   Sputum evaluation   Final    THIS SPECIMEN IS ACCEPTABLE. RESPIRATORY CULTURE REPORT TO FOLLOW.   Report Status 04/04/2015 FINAL  Final  Culture, respiratory (NON-Expectorated)     Status: None   Collection Time: 04/04/15  2:29 PM  Result Value Ref Range Status   Specimen Description SPUTUM  Final   Special Requests NONE  Final   Gram Stain   Final    ABUNDANT WBC PRESENT,BOTH PMN AND MONONUCLEAR NO SQUAMOUS EPITHELIAL CELLS SEEN NO ORGANISMS SEEN Performed at [facility]    Culture   Final    NO GROWTH 2 DAYS Performed at [facility]    Report Status 04/07/2015 FINAL  Final  AFB culture with smear     Status: None (Preliminary result)   Collection Time: 04/05/15 11:41 AM  Result Value Ref Range Status   Specimen Description BRONCHIAL ALVEOLAR LAVAGE  Final   Special Requests Normal  Final   Acid Fast Smear   Final    NO ACID FAST BACILLI SEEN Performed at [facility]    Culture   Final    CULTURE WILL BE EXAMINED FOR 6 WEEKS BEFORE ISSUING A FINAL REPORT Performed at [facility]    Report Status PENDING  Incomplete  Culture, respiratory  (NON-Expectorated)     Status: None (Preliminary result)   Collection Time: 04/05/15 11:41 AM  Result Value Ref Range Status   Specimen Description BRONCHIAL ALVEOLAR LAVAGE  Final   Special Requests Normal  Final   Gram Stain   Final    FEW WBC PRESENT,BOTH PMN AND MONONUCLEAR NO SQUAMOUS EPITHELIAL CELLS SEEN NO ORGANISMS SEEN Performed at [facility]    Culture   Final    NO GROWTH 1 DAY Performed at [facility]    Report Status PENDING  Incomplete  Fungus Culture with Smear     Status: None (Preliminary result)   Collection Time: 04/05/15 11:41 AM  Result Value Ref Range Status   Specimen Description BRONCHIAL ALVEOLAR LAVAGE  Final   Special Requests Normal  Final   Fungal Smear   Final    NO YEAST OR FUNGAL ELEMENTS SEEN Performed at [facility]    Culture   Final    CULTURE IN PROGRESS FOR FOUR WEEKS Performed at [facility]    Report Status PENDING  Incomplete     Studies: No results found.  Scheduled Meds: . feeding supplement (ENSURE ENLIVE)  237 mL Oral BID BM  . heparin  5,000 Units Subcutaneous 3 times per day  . meloxicam  15 mg Oral Daily  . piperacillin-tazobactam (ZOSYN)  IV  3.375 g Intravenous Q8H  . potassium chloride  40 mEq Oral BID  . vancomycin  750 mg Intravenous Q8H    Continuous Infusions: . sodium chloride 10 mL/hr at 04/05/15 1041     Time spent: 15 min  [facility] Pager [phone] If 7PM-7AM, please contact night-coverage at [email or web address], password [facility] 04/07/2015, 4:00 PM  LOS: 4 days

## 2015-04-08 DIAGNOSIS — M0510 Rheumatoid lung disease with rheumatoid arthritis of unspecified site: Secondary | ICD-10-CM

## 2015-04-08 DIAGNOSIS — J189 Pneumonia, unspecified organism: Secondary | ICD-10-CM | POA: Diagnosis present

## 2015-04-08 DIAGNOSIS — I480 Paroxysmal atrial fibrillation: Secondary | ICD-10-CM | POA: Diagnosis present

## 2015-04-08 LAB — BASIC METABOLIC PANEL
ANION GAP: 8 (ref 5–15)
BUN: 8 mg/dL (ref 6–20)
CALCIUM: 8.5 mg/dL — AB (ref 8.9–10.3)
CO2: 26 mmol/L (ref 22–32)
CREATININE: 0.5 mg/dL (ref 0.44–1.00)
Chloride: 100 mmol/L — ABNORMAL LOW (ref 101–111)
GFR calc Af Amer: >60 mL/min (ref >60)
Glucose, Bld: 132 mg/dL — ABNORMAL HIGH (ref 65–99)
POTASSIUM: 4 mmol/L (ref 3.5–5.1)
Sodium: 134 mmol/L — ABNORMAL LOW (ref 135–145)

## 2015-04-08 LAB — PROCALCITONIN: PROCALCITONIN: 0.56 ng/mL

## 2015-04-08 LAB — CULTURE, BLOOD (ROUTINE X 2)
CULTURE: NO GROWTH
Culture: NO GROWTH

## 2015-04-08 LAB — VANCOMYCIN, TROUGH: VANCOMYCIN TR: 17 ug/mL (ref 10.0–20.0)

## 2015-04-08 MED ORDER — METHYLPREDNISOLONE SODIUM SUCC 40 MG IJ SOLR
40.0000 mg | Freq: Four times a day (QID) | INTRAMUSCULAR | Status: DC
  Administered 2015-04-08 – 2015-04-09 (×5): 40 mg via INTRAVENOUS
  Filled 2015-04-08 (×5): qty 1

## 2015-04-08 NOTE — Progress Notes (Addendum)
PROGRESS NOTE  [name] [name] XKG:[id number] DOB: February 01, 1951 DOA: 04/03/2015 PCP: Criselda Peaches, MD  HPI/Recap of past 35 hours: 64 year old female with past medical history of recent hospitalization for right middle lobe pneumonia complicated by right pleural effusion status post thoracentesis. Despite multiple courses of IV and by mouth antibiotics, pneumonia has not resolved. Previous cultures and cytology negative for bacteria or malignancy.  Patient readmitted on 03/1997 for recurrent shortness of breath and worsening pneumonia.  On admission, patient noted to be in rapid A. fib, however she converted to normal sinus even prior to starting Cardizem  Since admission, patient has had issues with joint pain and swelling which is new including her MCP, DIP, PIP, wrist, shoulders and now right hip. CRP and. sedimentation rate elevated. No events overnight. White blood cell count this morning still elevated despite several days of IV antibiotics now. Patient underwent bronchoscopy by pulmonary with results pending, per patient was told that this has not revealed any dx .  Now started on steroid by pulm for concern for rheumatoid lung disease rather than infectious process   Subjective-continues to have bilateral chest pain and cough, continues to have pain in her joints in the hand  Assessment/plan:  Persistent bacterial lobar pneumonia right-sided pleural effusion status post thoracentesis on 9/15: Readmitted 9/20,CTA showed small b/l effusions, stable RML consolidation  . Pulmonary following,   Status post bronchoscopy on 9/22, follow fluid culture, Aspergillus antigen and cytology, transbronchial biopsy results pending,  Fungal infection versus resistant bacteria versus autoimmune, especially in the setting of her hand swelling. Lab studies suggest rheumatoid arthritis per serology. Pulmonary has started the patient on Solu-Medrol 40 mg IV every 6 hours and 9/25. Follow-up chest x-ray on 9/26. Check  pro calcitonin. If negative DC antibiotics. She will need rheumatologic  evaluation outpatient.   Atrial fibrillation: Echocardiogram done last admission last week notes normal atria and the size. Given that patient is currently in sinus rhythm, will monitor for now. Remains in good control.   Joint pain: Serology confirms rheumatoid arthritis, started on IV Solu-Medrol, acute process and elevated sedimentation rate plus CRP. Anti-CCP antibody elevated,. Hopefully her arthritis will respond to steroids with symptomatic improvement.. Will order physical therapy evaluation   Code Status: Full code  Family Communication: none present.   Disposition Plan: Anticipate discharge when okay with pulmonary, PT eval   Consultants:  Pulmonary  Procedures:  Bronchoscopy done 9/22: Results pending  Antibiotics:  IV Zosyn 9/20-present  IV vancomycin 9/20-present   Objective: BP 120/58 mmHg  Pulse 72  Temp(Src) 98.4 F (36.9 C) (Oral)  Resp 20  Ht 5' 6.5" (1.689 m)  Wt 60 kg (132 lb 4.4 oz)  BMI 21.03 kg/m2  SpO2 96%  LMP 07/14/1993  Intake/Output Summary (Last 24 hours) at 04/08/15 1100 Last data filed at 04/08/15 0554  Gross per 24 hour  Intake  559.9 ml  Output      0 ml  Net  559.9 ml   Filed Weights   04/04/15 1400  Weight: 60 kg (132 lb 4.4 oz)    Exam: General: pleasant, intermittent coughing spells while talking HEENT: wears glasses, pupils reactive Cardiac: regular, no murmur Chest: b/l faint crackles, no wheeze Abd: soft, non tender Ext: swelling of MCP and wrist joints b/l with tenderness to light palpation and passive range of motion Neuro: normal strength Skin: no rashes Data Reviewed: Basic Metabolic Panel:  Recent Labs Lab 04/03/15 1122 04/05/15 0450 04/06/15 7026 04/07/15 0340 04/08/15 0924  NA  132* 139 138 138 134*  K 3.7 3.4* 3.6 3.3* 4.0  CL 95* 100* 98* 100* 100*  CO2 27 28 30 30 26   GLUCOSE 151* 106* 131* 115* 132*  BUN 10 6 9 7 8     CREATININE 0.53 0.62 0.64 0.47 0.50  CALCIUM 9.1 8.7* 8.7* 8.4* 8.5*   Liver Function Tests:  Recent Labs Lab 04/06/15 0812  AST 37  ALT 22  ALKPHOS 262*  BILITOT 0.5  PROT 6.3*  ALBUMIN 2.1*   No results for input(s): LIPASE, AMYLASE in the last 168 hours. No results for input(s): AMMONIA in the last 168 hours. CBC:  Recent Labs Lab 04/03/15 1122 04/05/15 0450 04/07/15 0913  WBC 25.9 Repeated and verified X2.* 23.9* 20.3*  NEUTROABS 24.0*  --   --   HGB 11.7* 11.0* 10.8*  HCT 35.1* 33.6* 33.5*  MCV 93.5 93.9 96.0  PLT 780.0* 732* 705*   Cardiac Enzymes:    Recent Labs Lab 04/03/15 1122 04/03/15 1847 04/03/15 2048 04/04/15 0218 04/04/15 0846  CKTOTAL 33  --   --   --   --   TROPONINI  --  0.17* 0.18* 0.18* 0.14*   BNP (last 3 results) No results for input(s): BNP in the last 8760 hours.  ProBNP (last 3 results) No results for input(s): PROBNP in the last 8760 hours.  CBG: No results for input(s): GLUCAP in the last 168 hours.  Recent Results (from the past 240 hour(s))  Culture, body fluid-bottle     Status: None   Collection Time: 03/29/15  2:21 PM  Result Value Ref Range Status   Specimen Description FLUID PLEURAL  Final   Special Requests BOTTLES DRAWN AEROBIC AND ANAEROBIC 10CC  Final   Culture   Final    NO GROWTH 5 DAYS Performed at [facility]    Report Status 04/03/2015 FINAL  Final  Gram stain     Status: None   Collection Time: 03/29/15  2:21 PM  Result Value Ref Range Status   Specimen Description FLUID PLEURAL  Final   Special Requests NONE  Final   Gram Stain   Final    WBC PRESENT,BOTH PMN AND MONONUCLEAR NO ORGANISMS SEEN CYTOSPIN Performed at [facility]    Report Status 03/29/2015 FINAL  Final  Blood culture (routine x 2)     Status: None (Preliminary result)   Collection Time: 04/03/15  8:00 PM  Result Value Ref Range Status   Specimen Description BLOOD BLOOD LEFT FOREARM  Final   Special Requests  BOTTLES DRAWN AEROBIC AND ANAEROBIC 5CC  Final   Culture   Final    NO GROWTH 4 DAYS Performed at [facility]    Report Status PENDING  Incomplete  Blood culture (routine x 2)     Status: None (Preliminary result)   Collection Time: 04/03/15  8:00 PM  Result Value Ref Range Status   Specimen Description BLOOD RIGHT ANTECUBITAL  Final   Special Requests BOTTLES DRAWN AEROBIC ONLY 8ML  Final   Culture   Final    NO GROWTH 4 DAYS Performed at [facility]    Report Status PENDING  Incomplete  MRSA PCR Screening     Status: None   Collection Time: 04/04/15  1:52 PM  Result Value Ref Range Status   MRSA by PCR NEGATIVE NEGATIVE Final    Comment:        The GeneXpert MRSA Assay (FDA approved for NASAL specimens only), is one  component of a comprehensive MRSA colonization surveillance program. It is not intended to diagnose MRSA infection nor to guide or monitor treatment for MRSA infections.   Culture, sputum-assessment     Status: None   Collection Time: 04/04/15  2:29 PM  Result Value Ref Range Status   Specimen Description SPUTUM  Final   Special Requests NONE  Final   Sputum evaluation   Final    THIS SPECIMEN IS ACCEPTABLE. RESPIRATORY CULTURE REPORT TO FOLLOW.   Report Status 04/04/2015 FINAL  Final  Culture, respiratory (NON-Expectorated)     Status: None   Collection Time: 04/04/15  2:29 PM  Result Value Ref Range Status   Specimen Description SPUTUM  Final   Special Requests NONE  Final   Gram Stain   Final    ABUNDANT WBC PRESENT,BOTH PMN AND MONONUCLEAR NO SQUAMOUS EPITHELIAL CELLS SEEN NO ORGANISMS SEEN Performed at [facility]    Culture   Final    NO GROWTH 2 DAYS Performed at [facility]    Report Status 04/07/2015 FINAL  Final  AFB culture with smear     Status: None (Preliminary result)   Collection Time: 04/05/15 11:41 AM  Result Value Ref Range Status   Specimen Description BRONCHIAL ALVEOLAR LAVAGE  Final    Special Requests Normal  Final   Acid Fast Smear   Final    NO ACID FAST BACILLI SEEN Performed at [facility]    Culture   Final    CULTURE WILL BE EXAMINED FOR 6 WEEKS BEFORE ISSUING A FINAL REPORT Performed at [facility]    Report Status PENDING  Incomplete  Culture, respiratory (NON-Expectorated)     Status: None   Collection Time: 04/05/15 11:41 AM  Result Value Ref Range Status   Specimen Description BRONCHIAL ALVEOLAR LAVAGE  Final   Special Requests Normal  Final   Gram Stain   Final    FEW WBC PRESENT,BOTH PMN AND MONONUCLEAR NO SQUAMOUS EPITHELIAL CELLS SEEN NO ORGANISMS SEEN Performed at [facility]    Culture   Final    NO GROWTH 2 DAYS Performed at [facility]    Report Status 04/07/2015 FINAL  Final  Fungus Culture with Smear     Status: None (Preliminary result)   Collection Time: 04/05/15 11:41 AM  Result Value Ref Range Status   Specimen Description BRONCHIAL ALVEOLAR LAVAGE  Final   Special Requests Normal  Final   Fungal Smear   Final    NO YEAST OR FUNGAL ELEMENTS SEEN Performed at [facility]    Culture   Final    CULTURE IN PROGRESS FOR FOUR WEEKS Performed at [facility]    Report Status PENDING  Incomplete     Studies: No results found.  Scheduled Meds: . feeding supplement (ENSURE ENLIVE)  237 mL Oral BID BM  . heparin  5,000 Units Subcutaneous 3 times per day  . meloxicam  15 mg Oral Daily  . methylPREDNISolone (SOLU-MEDROL) injection  40 mg Intravenous Q6H  . piperacillin-tazobactam (ZOSYN)  IV  3.375 g Intravenous Q8H  . vancomycin  750 mg Intravenous Q8H    Continuous Infusions: . sodium chloride 10 mL/hr at 04/05/15 1041     Time spent: 15 min  [facility]  [facility] Pager [phone] If 7PM-7AM, please contact night-coverage at [email or web address], password [email or web address] 04/08/2015, 11:00 AM  LOS: 5 days

## 2015-04-08 NOTE — Progress Notes (Addendum)
PCCM PROGRESS NOTE  ADMISSION DATE: 04/03/2015 CONSULT DATE: 04/04/2015 REFERRING PROVIDER: [facility]  CC: Short of breath  SUBJECTIVE: She still has dry cough and intermittent pleuritic type chest pain b/l.  She has noticed pain and swelling in her hands b/l >> MCP joints and wrists.  Joint symptoms have developed over the past 6 to 8 days.  She finds it difficult to close her hands now.  OBJECTIVE: Temp:  [98.4 F (36.9 C)-100 F (37.8 C)] 98.4 F (36.9 C) (09/25 0546) Pulse Rate:  [72-94] 72 (09/25 0546) Resp:  [18-21] 20 (09/25 0546) BP: (120-128)/(54-60) 120/58 mmHg (09/25 0546) SpO2:  [93 %-98 %] 96 % (09/25 0546)   General: pleasant, intermittent coughing spells while talking HEENT: wears glasses, pupils reactive Cardiac: regular, no murmur Chest: b/l faint crackles, no wheeze Abd: soft, non tender Ext: swelling of MCP and wrist joints b/l with tenderness to light palpation and passive range of motion Neuro: normal strength Skin: no rashes   CMP Latest Ref Rng 04/07/2015 04/06/2015 04/05/2015  Glucose 65 - 99 mg/dL 115(H) 131(H) 106(H)  BUN 6 - 20 mg/dL _0 Creatinine 0.44 - 1.00 mg/dL 0.47 0.64 0.62  Sodium 135 - 145 mmol/L 138 138 139  Potassium 3.5 - 5.1 mmol/L 3.3(L) 3.6 3.4(L)  Chloride 101 - 111 mmol/L 100(L) 98(L) 100(L)  CO2 22 - 32 mmol/L _1 Calcium 8.9 - 10.3 mg/dL 8.4(L) 8.7(L) 8.7(L)  Total Protein 6.5 - 8.1 g/dL - 6.3(L) -  Total Bilirubin 0.3 - 1.2 mg/dL - 0.5 -  Alkaline Phos 38 - 126 U/L - 262(H) -  AST 15 - 41 U/L - 37 -  ALT 14 - 54 U/L - 22 -    CBC Latest Ref Rng 04/07/2015 04/05/2015 04/03/2015  WBC 4.0 - 10.5 K/uL 20.3(H) 23.9(H) 25.9 Repeated and verified X2.(HH)  Hemoglobin 12.0 - 15.0 g/dL 10.8(L) 11.0(L) 11.7(L)  Hematocrit 36.0 - 46.0 % 33.5(L) 33.6(L) 35.1(L)  Platelets 150 - 400 K/uL 705(H) 732(H) 780.0(H)    Lab Results  Component Value Date   ESRSEDRATE 134* 04/04/2015     No results found.   CULTURES: Blood 9/20  >> HIV 9/20 >> non reactive Sputum 9/21 >> negative Legionella Ag 9/21 >> negative Pneumococcal Ag 9/21 >> negative BAL 9/22 >> negative BAL fungal 9/22 >> BAL AFB 9/22 >>  ANTIBIOTICS: Vancomycin 9/20 >> Zosyn 9/20 >>  STUDIES: 9/04 CT angio chest >> patchy GGO mostly RML/RLL, mild emphysema 9/05 Echo >> EF 60 to 65%, PAS 34 mmHg 9/14 CT chest >> small Rt effusion with minimal loculation, progression of b/l ASD 9/15 Rt thoracentesis >> 200 ml serous fluid, protein 4.1, LDH 472, glucose 99, WBC 2220 (46%L, 25%E, 21%N, 8%M), reactive mesothelial cells with mixed lymphoid population 9/20 CT angio chest >> small b/l effusions, stable RML consolidation 9/22 Bronchoscopy >> TBx RML negative, BAL WBC 360 (45%N, 30%M, 16%E, 9%L)  SEROLOGY: Anti-CCP 9/21 >> greater than 250 Rheumatoid factor 9/21 >> 81.6 ANA 9/21 >> negative  EVENTS: 9/04 to 9/07 Admit for PNA 9/08 Outpt pulmonary evaluation, fever (Tm 101.1) 9/15 Thoracentesis by IR 9/20 Admit 9/22 Bronchoscopy 9/25 Start solumedrol  DISCUSSION: 64 yo female never smoker presented with fever, progressive non productive cough, syncope around Labor Day.  Her symptoms started after hiking [facility].  She has persistent symptoms of cough, dyspnea, and pleuritic chest pain.  She has been on multiple courses of Abx.  Culture results negative to date.  She has significant elevation in ESR and anti-CCP.  She has also developed swelling of her MCP and wrist joints b/l.  This raises concern for rheumatoid lung disease rather than infectious process.  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 Plan: - start solumedrol 40 mg q6h on 9/25 - f/u CXR 9/26 - check procalcitonin 9/25 >> if negative, then d/c Abx - arrange for PFT's - she will likely need rheumatology evaluation as an outpt - don't think ID evaluation is needed at  this time  Acute hypoxic respiratory failure. Plan: - adjust oxygen to keep SpO2 > 92%   Chesley Mires, MD [place] 04/08/2015, 10:26 AM Pager:  [phone] After 3pm call: [phone]   Procalcitonin 0.56.  Will d/c Abx.  Chesley Mires, MD [facility] Pulmonary/Critical Care 04/08/2015, 12:12 PM Pager:  [phone] After 3pm call: [phone]

## 2015-04-08 NOTE — Progress Notes (Signed)
ANTIBIOTIC CONSULT NOTE - Follow Up  Pharmacy Consult for Vancomycin & Zosyn Indication: pneumonia  Allergies  Allergen Reactions  . Levaquin [Levofloxacin]     Joint pain/soreness, pain in hands, calf's.  . Cephalexin Rash    Patient Measurements: Height: 5' 6.5" (168.9 cm) Weight: 132 lb 4.4 oz (60 kg) IBW/kg (Calculated) : 60.45  Vital Signs: Temp: 98.4 F (36.9 C) (09/25 0546) Temp Source: Oral (09/25 0546) BP: 120/58 mmHg (09/25 0546) Pulse Rate: 72 (09/25 0546) Intake/Output from previous day: 09/24 0701 - 09/25 0700 In: 919.9 [P.O.:600; I.V.:69.9; IV Piggyback:250] Out: -  Intake/Output from this shift:    Labs:  Recent Labs  04/06/15 0812 04/07/15 0340 04/07/15 0913 04/08/15 0924  WBC  --   --  20.3*  --   HGB  --   --  10.8*  --   PLT  --   --  705*  --   CREATININE 0.64 0.47  --  0.50   Estimated Creatinine Clearance: 66.4 mL/min (by C-G formula based on Cr of 0.5).  Recent Labs  04/06/15 0812 04/08/15 0924  [place] 10 14     Microbiology: Recent Results (from the past 720 hour(s))  Blood culture (routine x 2)     Status: None   Collection Time: 03/18/15  7:39 PM  Result Value Ref Range Status   Specimen Description BLOOD LEFT ARM  Final   Special Requests BOTTLES DRAWN AEROBIC AND ANAEROBIC 5CC  Final   Culture   Final    NO GROWTH 5 DAYS Performed at [facility]    Report Status 03/24/2015 FINAL  Final  Blood culture (routine x 2)     Status: None   Collection Time: 03/18/15  8:27 PM  Result Value Ref Range Status   Specimen Description BLOOD RIGHT FOREARM  Final   Special Requests BOTTLES DRAWN AEROBIC AND ANAEROBIC 5ML  Final   Culture   Final    NO GROWTH 5 DAYS Performed at [facility]    Report Status 03/24/2015 FINAL  Final  C difficile quick scan w PCR reflex     Status: None   Collection Time: 03/20/15  6:58 PM  Result Value Ref Range Status   C Diff antigen NEGATIVE NEGATIVE Final   C Diff toxin  NEGATIVE NEGATIVE Final   C Diff interpretation Negative for toxigenic C. difficile  Final  Culture, body fluid-bottle     Status: None   Collection Time: 03/29/15  2:21 PM  Result Value Ref Range Status   Specimen Description FLUID PLEURAL  Final   Special Requests BOTTLES DRAWN AEROBIC AND ANAEROBIC 10CC  Final   Culture   Final    NO GROWTH 5 DAYS Performed at [facility]    Report Status 04/03/2015 FINAL  Final  Gram stain     Status: None   Collection Time: 03/29/15  2:21 PM  Result Value Ref Range Status   Specimen Description FLUID PLEURAL  Final   Special Requests NONE  Final   Gram Stain   Final    WBC PRESENT,BOTH PMN AND MONONUCLEAR NO ORGANISMS SEEN CYTOSPIN Performed at [facility]    Report Status 03/29/2015 FINAL  Final  Blood culture (routine x 2)     Status: None (Preliminary result)   Collection Time: 04/03/15  8:00 PM  Result Value Ref Range Status   Specimen Description BLOOD BLOOD LEFT FOREARM  Final   Special Requests BOTTLES DRAWN AEROBIC AND ANAEROBIC 5CC  Final   Culture   Final    NO GROWTH 4 DAYS Performed at [facility]    Report Status PENDING  Incomplete  Blood culture (routine x 2)     Status: None (Preliminary result)   Collection Time: 04/03/15  8:00 PM  Result Value Ref Range Status   Specimen Description BLOOD RIGHT ANTECUBITAL  Final   Special Requests BOTTLES DRAWN AEROBIC ONLY 8ML  Final   Culture   Final    NO GROWTH 4 DAYS Performed at [facility]    Report Status PENDING  Incomplete  MRSA PCR Screening     Status: None   Collection Time: 04/04/15  1:52 PM  Result Value Ref Range Status   MRSA by PCR NEGATIVE NEGATIVE Final    Comment:        The GeneXpert MRSA Assay (FDA approved for NASAL specimens only), is one component of a comprehensive MRSA colonization surveillance program. It is not intended to diagnose MRSA infection nor to guide or monitor treatment for MRSA infections.    Culture, sputum-assessment     Status: None   Collection Time: 04/04/15  2:29 PM  Result Value Ref Range Status   Specimen Description SPUTUM  Final   Special Requests NONE  Final   Sputum evaluation   Final    THIS SPECIMEN IS ACCEPTABLE. RESPIRATORY CULTURE REPORT TO FOLLOW.   Report Status 04/04/2015 FINAL  Final  Culture, respiratory (NON-Expectorated)     Status: None   Collection Time: 04/04/15  2:29 PM  Result Value Ref Range Status   Specimen Description SPUTUM  Final   Special Requests NONE  Final   Gram Stain   Final    ABUNDANT WBC PRESENT,BOTH PMN AND MONONUCLEAR NO SQUAMOUS EPITHELIAL CELLS SEEN NO ORGANISMS SEEN Performed at [facility]    Culture   Final    NO GROWTH 2 DAYS Performed at [facility]    Report Status 04/07/2015 FINAL  Final  AFB culture with smear     Status: None (Preliminary result)   Collection Time: 04/05/15 11:41 AM  Result Value Ref Range Status   Specimen Description BRONCHIAL ALVEOLAR LAVAGE  Final   Special Requests Normal  Final   Acid Fast Smear   Final    NO ACID FAST BACILLI SEEN Performed at [facility]    Culture   Final    CULTURE WILL BE EXAMINED FOR 6 WEEKS BEFORE ISSUING A FINAL REPORT Performed at [facility]    Report Status PENDING  Incomplete  Culture, respiratory (NON-Expectorated)     Status: None   Collection Time: 04/05/15 11:41 AM  Result Value Ref Range Status   Specimen Description BRONCHIAL ALVEOLAR LAVAGE  Final   Special Requests Normal  Final   Gram Stain   Final    FEW WBC PRESENT,BOTH PMN AND MONONUCLEAR NO SQUAMOUS EPITHELIAL CELLS SEEN NO ORGANISMS SEEN Performed at [facility]    Culture   Final    NO GROWTH 2 DAYS Performed at [facility]    Report Status 04/07/2015 FINAL  Final  Fungus Culture with Smear     Status: None (Preliminary result)   Collection Time: 04/05/15 11:41 AM  Result Value Ref Range Status   Specimen Description  BRONCHIAL ALVEOLAR LAVAGE  Final   Special Requests Normal  Final   Fungal Smear   Final    NO YEAST OR FUNGAL ELEMENTS SEEN Performed at [facility]  Culture   Final    CULTURE IN PROGRESS FOR FOUR WEEKS Performed at [facility]    Report Status PENDING  Incomplete    Medications:  Scheduled:  . feeding supplement (ENSURE ENLIVE)  237 mL Oral BID BM  . heparin  5,000 Units Subcutaneous 3 times per day  . meloxicam  15 mg Oral Daily  . piperacillin-tazobactam (ZOSYN)  IV  3.375 g Intravenous Q8H  . vancomycin  750 mg Intravenous Q8H   Infusions:  . sodium chloride 10 mL/hr at 04/05/15 1041   Assessment: 63 yr female with recent h/o pneumonia presents with fever after recent thoracentesis 9/15.  Presents to ED with complaints of worsening symptoms. Pt found to be in AFib and ongoing pneumonia. Pharmacy consulted to dose Vancomycin and Zosyn for pneumonia. Bronchoscopy performed 9/22.  9/20 >> Vanc >> 9/20 >> Zosyn >>    9/20 blood x2: ngtd 9/21 sputum: ngtd 9/20 HIV Ab: neg 9/21 urine strep/legionella: neg/neg 9/22 BAL: ngtd, AFB: sent,  Fungal: sent  Today, 04/08/2015: Tm24: 100 WBC: remain elevated but slightly improved (steroids started 9/25) SCr stable at 0.5, CrCl 66  Trough/Dose change info: 9/23 VT = 10mg /l on 750mg  q12h. Change to 750mg  q8h. 9/24 VT at 09:30 = 17 on 750mg  q8h, no change   Goal of Therapy:  Vancomycin trough level 15-20 mcg/ml  Appropriate antibiotic dosing for renal function; eradication of infection  Plan:  Day #6 antibiotics  Continue Vancomycin 750mg  IV q8h.  Cont Zosyn 3.375g IV Q8H infused over 4hrs.  Follow up renal fxn, culture results, and planned duration of antibiotics.  Peggyann Juba, PharmD, BCPS Pager: [phone] 04/08/2015,10:18 AM.

## 2015-04-09 ENCOUNTER — Inpatient Hospital Stay (HOSPITAL_COMMUNITY): Payer: No Typology Code available for payment source

## 2015-04-09 ENCOUNTER — Telehealth: Payer: Self-pay | Admitting: Internal Medicine

## 2015-04-09 DIAGNOSIS — M069 Rheumatoid arthritis, unspecified: Secondary | ICD-10-CM | POA: Diagnosis present

## 2015-04-09 LAB — CBC WITH DIFFERENTIAL/PLATELET
Basophils Absolute: 0 10*3/uL (ref 0.0–0.1)
Basophils Relative: 0 %
EOS PCT: 0 %
Eosinophils Absolute: 0 10*3/uL (ref 0.0–0.7)
HCT: 33.2 % — ABNORMAL LOW (ref 36.0–46.0)
Hemoglobin: 10.9 g/dL — ABNORMAL LOW (ref 12.0–15.0)
LYMPHS ABS: 1.8 10*3/uL (ref 0.7–4.0)
Lymphocytes Relative: 7 %
MCH: 31.1 pg (ref 26.0–34.0)
MCHC: 32.8 g/dL (ref 30.0–36.0)
MCV: 94.6 fL (ref 78.0–100.0)
MONO ABS: 0.8 10*3/uL (ref 0.1–1.0)
Monocytes Relative: 3 %
Neutro Abs: 22.6 10*3/uL — ABNORMAL HIGH (ref 1.7–7.7)
Neutrophils Relative %: 90 %
PLATELETS: 745 10*3/uL — AB (ref 150–400)
RBC: 3.51 MIL/uL — AB (ref 3.87–5.11)
RDW: 13.5 % (ref 11.5–15.5)
WBC: 25.2 10*3/uL — AB (ref 4.0–10.5)

## 2015-04-09 LAB — PULMONARY FUNCTION TEST
DL/VA % PRED: 77 %
DL/VA: 3.92 ml/min/mmHg/L
DLCO COR: 16.37 ml/min/mmHg
DLCO cor % pred: 60 %
DLCO unc % pred: 55 %
DLCO unc: 14.95 ml/min/mmHg
FEF 25-75 Post: 2.54 L/sec
FEF 25-75 Pre: 2.6 L/sec
FEF2575-%CHANGE-POST: -2 %
FEF2575-%PRED-POST: 114 %
FEF2575-%Pred-Pre: 117 %
FEV1-%CHANGE-POST: 0 %
FEV1-%PRED-PRE: 81 %
FEV1-%Pred-Post: 80 %
FEV1-PRE: 2.11 L
FEV1-Post: 2.1 L
FEV1FVC-%CHANGE-POST: 1 %
FEV1FVC-%Pred-Pre: 112 %
FEV6-%Change-Post: -2 %
FEV6-%Pred-Post: 73 %
FEV6-%Pred-Pre: 74 %
FEV6-PRE: 2.43 L
FEV6-Post: 2.37 L
FEV6FVC-%PRED-PRE: 104 %
FEV6FVC-%Pred-Post: 104 %
FVC-%Change-Post: -2 %
FVC-%PRED-POST: 70 %
FVC-%Pred-Pre: 71 %
FVC-PRE: 2.43 L
FVC-Post: 2.37 L
POST FEV1/FVC RATIO: 88 %
PRE FEV6/FVC RATIO: 100 %
Post FEV6/FVC ratio: 100 %
Pre FEV1/FVC ratio: 87 %
RV % PRED: 109 %
RV: 2.41 L
TLC % pred: 91 %
TLC: 4.88 L

## 2015-04-09 LAB — COMPREHENSIVE METABOLIC PANEL
ALT: 21 U/L (ref 14–54)
ANION GAP: 10 (ref 5–15)
AST: 24 U/L (ref 15–41)
Albumin: 2.1 g/dL — ABNORMAL LOW (ref 3.5–5.0)
Alkaline Phosphatase: 322 U/L — ABNORMAL HIGH (ref 38–126)
BUN: 13 mg/dL (ref 6–20)
CHLORIDE: 101 mmol/L (ref 101–111)
CO2: 26 mmol/L (ref 22–32)
Calcium: 8.7 mg/dL — ABNORMAL LOW (ref 8.9–10.3)
Creatinine, Ser: 0.37 mg/dL — ABNORMAL LOW (ref 0.44–1.00)
Glucose, Bld: 164 mg/dL — ABNORMAL HIGH (ref 65–99)
POTASSIUM: 4.1 mmol/L (ref 3.5–5.1)
Sodium: 137 mmol/L (ref 135–145)
Total Bilirubin: 0.3 mg/dL (ref 0.3–1.2)
Total Protein: 6.4 g/dL — ABNORMAL LOW (ref 6.5–8.1)

## 2015-04-09 LAB — MISC LABCORP TEST (SEND OUT): LABCORP TEST CODE: 183805

## 2015-04-09 MED ORDER — ENOXAPARIN SODIUM 40 MG/0.4ML ~~LOC~~ SOLN: 40.0000 mg | SUBCUTANEOUS | Status: DC

## 2015-04-09 MED ORDER — PREDNISONE 20 MG PO TABS
60.0000 mg | ORAL_TABLET | Freq: Every day | ORAL | Status: DC
  Administered 2015-04-10: 60 mg via ORAL
  Filled 2015-04-09: qty 3

## 2015-04-09 MED ORDER — ALBUTEROL SULFATE (2.5 MG/3ML) 0.083% IN NEBU
2.5000 mg | INHALATION_SOLUTION | Freq: Once | RESPIRATORY_TRACT | Status: AC
  Administered 2015-04-09: 2.5 mg via RESPIRATORY_TRACT

## 2015-04-09 NOTE — Progress Notes (Signed)
PROGRESS NOTE  [name] [name] IWP:[id number] DOB: Aug 01, 1950 DOA: 04/03/2015 PCP: Criselda Peaches, MD  HPI/Recap of past 82 hours: 64 year old female with past medical history of recent hospitalization for right middle lobe pneumonia complicated by right pleural effusion status post thoracentesis. Despite multiple courses of IV and by mouth antibiotics, pneumonia has not resolved. Previous cultures and cytology negative for bacteria or malignancy.  Patient readmitted on 03/1997 for recurrent shortness of breath and worsening pneumonia.  On admission, patient noted to be in rapid A. fib, however she converted to normal sinus even prior to starting Cardizem  Since admission, patient has had issues with joint pain and swelling which is new including her MCP, DIP, PIP, wrist, shoulders and now right hip. CRP and. sedimentation rate elevated. Bronchoscopy done with cultures and pathology so far negative date. Lab work in regards to patient's joint swelling consistent with rheumatoid arthritis and started on IV Solu-Medrol. Pro calcitonin level came back mildly elevated this morning, enough to continue antibiotics still.  Patient is a feeling much better, less joint pain. Able to ambulate well.  Assessment/plan:  Persistent bacterial lobar pneumonia: Appreciate pulmonary assistance. Fungal infection versus resistant bacteria versus autoimmune, especially in the setting of her hand swelling. Highly suspicious for autoimmune process given rheumatologic issues. Given mildly elevated Pro calcitonin level 0.56, will change antibiotics to by mouth  Leukocytosis: Secondary to IV steroids  Atrial fibrillation: Echocardiogram done last admission last week notes normal atria and the size. Given that patient is currently in sinus rhythm, will monitor for now. Patient's chads 2 vascular score of 2, will discharge on daily aspirin  Joint pain: Rheumatoid arthritis flareup. Have started IV Solu-Medrol and will  transition to by mouth steroids.  Setting up appointment for outpatient rheumatology  Procedures:  Bronchoscopy done 9/22: Predominant cultures and samples negative  Antibiotics:  IV Zosyn 9/20-present  IV vancomycin 9/20-present   Objective: BP 125/50 mmHg  Pulse 70  Temp(Src) 98.2 F (36.8 C) (Oral)  Resp 18  Ht 5' 6.5" (1.689 m)  Wt 60 kg (132 lb 4.4 oz)  BMI 21.03 kg/m2  SpO2 94%  LMP 07/14/1993  Intake/Output Summary (Last 24 hours) at 04/09/15 1111 Last data filed at 04/09/15 0605  Gross per 24 hour  Intake  431.1 ml  Output      0 ml  Net  431.1 ml   Filed Weights   04/04/15 1400  Weight: 60 kg (132 lb 4.4 oz)    Exam:   General:  alert and oriented 3, no acute distress  Cardiovascular: Regular rate and rhythm, S1-S2  Respiratory: mostly clear  Abdomen: soft, nontender, nondistended, positive bowel sounds  Musculoskeletal:   trace pitting edema, hands and fingers are less swollen, but less tender, better grip strength  Data Reviewed: Basic Metabolic Panel:  Recent Labs Lab 04/05/15 0450 04/06/15 0812 04/07/15 0340 04/08/15 0924 04/09/15 0423  NA 139 138 138 134* 137  K 3.4* 3.6 3.3* 4.0 4.1  CL 100* 98* 100* 100* 101  CO2 28 30 30 26 26   GLUCOSE 106* 131* 115* 132* 164*  BUN 6 9 7 8 13   CREATININE 0.62 0.64 0.47 0.50 0.37*  CALCIUM 8.7* 8.7* 8.4* 8.5* 8.7*   Liver Function Tests:  Recent Labs Lab 04/06/15 0812 04/09/15 0423  AST 37 24  ALT 22 21  ALKPHOS 262* 322*  BILITOT 0.5 0.3  PROT 6.3* 6.4*  ALBUMIN 2.1* 2.1*   No results for input(s): LIPASE, AMYLASE in the last 168  hours. No results for input(s): AMMONIA in the last 168 hours. CBC:  Recent Labs Lab 04/03/15 1122 04/05/15 0450 04/07/15 0913 04/09/15 0423  WBC 25.9 Repeated and verified X2.* 23.9* 20.3* 25.2*  NEUTROABS 24.0*  --   --  22.6*  HGB 11.7* 11.0* 10.8* 10.9*  HCT 35.1* 33.6* 33.5* 33.2*  MCV 93.5 93.9 96.0 94.6  PLT 780.0* 732* 705* 745*    Cardiac Enzymes:    Recent Labs Lab 04/03/15 1122 04/03/15 1847 04/03/15 2048 04/04/15 0218 04/04/15 0846  CKTOTAL 33  --   --   --   --   TROPONINI  --  0.17* 0.18* 0.18* 0.14*   BNP (last 3 results) No results for input(s): BNP in the last 8760 hours.  ProBNP (last 3 results) No results for input(s): PROBNP in the last 8760 hours.  CBG: No results for input(s): GLUCAP in the last 168 hours.  Recent Results (from the past 240 hour(s))  Blood culture (routine x 2)     Status: None   Collection Time: 04/03/15  8:00 PM  Result Value Ref Range Status   Specimen Description BLOOD BLOOD LEFT FOREARM  Final   Special Requests BOTTLES DRAWN AEROBIC AND ANAEROBIC 5CC  Final   Culture   Final    NO GROWTH 5 DAYS Performed at [facility]    Report Status 04/08/2015 FINAL  Final  Blood culture (routine x 2)     Status: None   Collection Time: 04/03/15  8:00 PM  Result Value Ref Range Status   Specimen Description BLOOD RIGHT ANTECUBITAL  Final   Special Requests BOTTLES DRAWN AEROBIC ONLY 8ML  Final   Culture   Final    NO GROWTH 5 DAYS Performed at [facility]    Report Status 04/08/2015 FINAL  Final  MRSA PCR Screening     Status: None   Collection Time: 04/04/15  1:52 PM  Result Value Ref Range Status   MRSA by PCR NEGATIVE NEGATIVE Final    Comment:        The GeneXpert MRSA Assay (FDA approved for NASAL specimens only), is one component of a comprehensive MRSA colonization surveillance program. It is not intended to diagnose MRSA infection nor to guide or monitor treatment for MRSA infections.   Culture, sputum-assessment     Status: None   Collection Time: 04/04/15  2:29 PM  Result Value Ref Range Status   Specimen Description SPUTUM  Final   Special Requests NONE  Final   Sputum evaluation   Final    THIS SPECIMEN IS ACCEPTABLE. RESPIRATORY CULTURE REPORT TO FOLLOW.   Report Status 04/04/2015 FINAL  Final  Culture, respiratory  (NON-Expectorated)     Status: None   Collection Time: 04/04/15  2:29 PM  Result Value Ref Range Status   Specimen Description SPUTUM  Final   Special Requests NONE  Final   Gram Stain   Final    ABUNDANT WBC PRESENT,BOTH PMN AND MONONUCLEAR NO SQUAMOUS EPITHELIAL CELLS SEEN NO ORGANISMS SEEN Performed at [facility]    Culture   Final    NO GROWTH 2 DAYS Performed at [facility]    Report Status 04/07/2015 FINAL  Final  AFB culture with smear     Status: None (Preliminary result)   Collection Time: 04/05/15 11:41 AM  Result Value Ref Range Status   Specimen Description BRONCHIAL ALVEOLAR LAVAGE  Final   Special Requests Normal  Final   Acid Fast Smear  Final    NO ACID FAST BACILLI SEEN Performed at [facility]    Culture   Final    CULTURE WILL BE EXAMINED FOR 6 WEEKS BEFORE ISSUING A FINAL REPORT Performed at [facility]    Report Status PENDING  Incomplete  Culture, respiratory (NON-Expectorated)     Status: None   Collection Time: 04/05/15 11:41 AM  Result Value Ref Range Status   Specimen Description BRONCHIAL ALVEOLAR LAVAGE  Final   Special Requests Normal  Final   Gram Stain   Final    FEW WBC PRESENT,BOTH PMN AND MONONUCLEAR NO SQUAMOUS EPITHELIAL CELLS SEEN NO ORGANISMS SEEN Performed at [facility]    Culture   Final    NO GROWTH 2 DAYS Performed at [facility]    Report Status 04/07/2015 FINAL  Final  Fungus Culture with Smear     Status: None (Preliminary result)   Collection Time: 04/05/15 11:41 AM  Result Value Ref Range Status   Specimen Description BRONCHIAL ALVEOLAR LAVAGE  Final   Special Requests Normal  Final   Fungal Smear   Final    NO YEAST OR FUNGAL ELEMENTS SEEN Performed at [facility]    Culture   Final    CULTURE IN PROGRESS FOR FOUR WEEKS Performed at [facility]    Report Status PENDING  Incomplete     Studies: Dg Chest 2 View  04/09/2015   CLINICAL  DATA:  Rheumatoid lung disease, cough, shortness of Breath  EXAM: CHEST  2 VIEW  COMPARISON:  04/05/2015  FINDINGS: Consolidation in the right middle lobe at the right lung base with small right pleural effusion, stable. Small left effusion also noted with minimal left base opacity, likely atelectasis. Heart is normal size. Underlying COPD. No acute bony abnormality.  IMPRESSION: Stable right lower lobe infiltrate concerning for pneumonia. Small right effusion.  Minimal left base atelectasis with small left effusion.  COPD.   Electronically Signed   By: Rolm Baptise M.D.   On: 04/09/2015 09:36    Scheduled Meds: . feeding supplement (ENSURE ENLIVE)  237 mL Oral BID BM  . heparin  5,000 Units Subcutaneous 3 times per day  . meloxicam  15 mg Oral Daily  . methylPREDNISolone (SOLU-MEDROL) injection  40 mg Intravenous Q6H    Continuous Infusions: . sodium chloride 10 mL/hr at 04/05/15 1041     Time spent: 25 min  [facility] Pager [phone]. If 7PM-7AM, please contact night-coverage at [email or web address], password [facility] 04/09/2015, 11:11 AM  LOS: 6 days

## 2015-04-09 NOTE — Progress Notes (Signed)
PCCM PROGRESS NOTE  ADMISSION DATE: 04/03/2015 CONSULT DATE: 04/04/2015 REFERRING PROVIDER: [facility]  CC: Short of breath  SUBJECTIVE:  Pt reports improvement hand symptoms, feels back to baseline in regards to breathing.  No cough / SOB  OBJECTIVE: Temp:  [97.8 F (36.6 C)-98.5 F (36.9 C)] 97.8 F (36.6 C) (09/26 1405) Pulse Rate:  [70-97] 97 (09/26 1405) Resp:  [18-20] 18 (09/26 1405) BP: (113-125)/(49-63) 113/49 mmHg (09/26 1405) SpO2:  [94 %-96 %] 96 % (09/26 1405)   General: pleasant adult female in NAD HEENT: wears glasses, pupils reactive Cardiac: regular, no murmur Chest: b/l faint crackles, no wheeze Abd: soft, non tender Ext: swelling of MCP and wrist joints b/l with tenderness to light palpation and passive range of motion Neuro: normal strength Skin: no rashes   CMP Latest Ref Rng 04/09/2015 04/08/2015 04/07/2015  Glucose 65 - 99 mg/dL 164(H) 132(H) 115(H)  BUN 6 - 20 mg/dL _0 Creatinine 0.44 - 1.00 mg/dL 0.37(L) 0.50 0.47  Sodium 135 - 145 mmol/L 137 134(L) 138  Potassium 3.5 - 5.1 mmol/L 4.1 4.0 3.3(L)  Chloride 101 - 111 mmol/L 101 100(L) 100(L)  CO2 22 - 32 mmol/L _1 Calcium 8.9 - 10.3 mg/dL 8.7(L) 8.5(L) 8.4(L)  Total Protein 6.5 - 8.1 g/dL 6.4(L) - -  Total Bilirubin 0.3 - 1.2 mg/dL 0.3 - -  Alkaline Phos 38 - 126 U/L 322(H) - -  AST 15 - 41 U/L 24 - -  ALT 14 - 54 U/L 21 - -    CBC Latest Ref Rng 04/09/2015 04/07/2015 04/05/2015  WBC 4.0 - 10.5 K/uL 25.2(H) 20.3(H) 23.9(H)  Hemoglobin 12.0 - 15.0 g/dL 10.9(L) 10.8(L) 11.0(L)  Hematocrit 36.0 - 46.0 % 33.2(L) 33.5(L) 33.6(L)  Platelets 150 - 400 K/uL 745(H) 705(H) 732(H)    Lab Results  Component Value Date   ESRSEDRATE 134* 04/04/2015     Dg Chest 2 View  04/09/2015   CLINICAL DATA:  Rheumatoid lung disease, cough, shortness of Breath  EXAM: CHEST  2 VIEW  COMPARISON:  04/05/2015  FINDINGS: Consolidation in the right middle lobe at the right lung base with small right pleural effusion,  stable. Small left effusion also noted with minimal left base opacity, likely atelectasis. Heart is normal size. Underlying COPD. No acute bony abnormality.  IMPRESSION: Stable right lower lobe infiltrate concerning for pneumonia. Small right effusion.  Minimal left base atelectasis with small left effusion.  COPD.   Electronically Signed   By: Rolm Baptise M.D.   On: 04/09/2015 09:36     CULTURES: Blood 9/20 >> HIV 9/20 >> non reactive Sputum 9/21 >> negative Legionella Ag 9/21 >> negative Pneumococcal Ag 9/21 >> negative BAL 9/22 >> negative BAL fungal 9/22 >> neg smear >>  BAL AFB 9/22 >> neg smear >>   ANTIBIOTICS: Vancomycin 9/20 >> Zosyn 9/20 >>  STUDIES: 9/04 CT angio chest >> patchy GGO mostly RML/RLL, mild emphysema 9/05 Echo >> EF 60 to 65%, PAS 34 mmHg 9/14 CT chest >> small Rt effusion with minimal loculation, progression of b/l ASD 9/15 Rt thoracentesis >> 200 ml serous fluid, protein 4.1, LDH 472, glucose 99, WBC 2220 (46%L, 25%E, 21%N, 8%M), reactive mesothelial cells with mixed lymphoid population 9/20 CT angio chest >> small b/l effusions, stable RML consolidation 9/22 Bronchoscopy >> TBx RML negative, BAL WBC 360 (45%N, 30%M, 16%E, 9%L)  SEROLOGY: Anti-CCP 9/21 >> greater than 250 Rheumatoid factor 9/21 >> 81.6 ANA 9/21 >> negative  EVENTS:  9/04 to 9/07 Admit for PNA 9/08 Outpt pulmonary evaluation, fever (Tm 101.1) 9/15 Thoracentesis by IR 9/20 Admit 9/22 Bronchoscopy 9/25 Start solumedrol  DISCUSSION: 64 y/o female never smoker presented with fever, progressive non productive cough, syncope around Labor Day.  Her symptoms started after hiking [facility].  She has persistent symptoms of cough, dyspnea, and pleuritic chest pain.  She has been on multiple courses of Abx.  Culture results negative to date.  She has significant elevation in ESR and anti-CCP.  She has also developed swelling of her MCP and wrist joints b/l.  This  raises concern for rheumatoid lung disease rather than infectious process.  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 Plan: - Transition to oral steroids 73m /kg 9/26 and remain until seen by Rheumatology - consider f/u CXR in office with Dr. WMelvyn Novason 10/10 - follow up PFT results - she will need rheumatology evaluation as an outpt >> ? Dr. CBenjiman Core- pulmonary hygiene - outpatient pulmonary follow up arranged   Acute hypoxic respiratory failure. Plan: - adjust oxygen to keep SpO2 > 92%   GLOBAL:  Pending d/c in am.  Pulmonary follow arranged.  PCCM will be available PRN.  Please call if we can be of assistance.   BNoe Gens NP-C [facility] Pgr: [phone] or if no answer [phone]/26/2016, 2:59 PM

## 2015-04-09 NOTE — Telephone Encounter (Signed)
accordting to media in epic these have been scanned in and was signed by MW lmtcb x1 for pt

## 2015-04-09 NOTE — Evaluation (Signed)
Physical Therapy Evaluation Patient Details Name: [name] [name] MRN: [id number] DOB: 10-21-1950 Today's Date: 04/09/2015   History of Present Illness  64 year old female with past medical history of recent hospitalization for right middle lobe pneumonia complicated by right pleural effusion status post thoracentesis. Despite multiple courses of IV and by mouth antibiotics, pneumonia has not resolved. Previous cultures and cytology negative for bacteria or malignancy. Patient readmitted on 04/02/15 for recurrent shortness of breath and worsening pneumonia. Pt then with testing that revealed she has rheumatoid arthritis and is being treated wtih IV steroids.  Clinical Impression  Pt presents as Mod I or Independent with all mobility, no further PT needs at this time.  Pt educated on importance of regular exercise, she verbalizes understanding.    Follow Up Recommendations No PT follow up    Equipment Recommendations  None recommended by PT    Recommendations for Other Services       Precautions / Restrictions Precautions Precautions: Fall Restrictions Weight Bearing Restrictions: No      Mobility  Bed Mobility Overal bed mobility: Independent                Transfers Overall transfer level: Modified independent                  Ambulation/Gait Ambulation/Gait assistance: Modified independent (Device/Increase time)   Assistive device: None Gait Pattern/deviations: WFL(Within Functional Limits)        Stairs            Wheelchair Mobility    Modified Rankin (Stroke Patients Only)       Balance                                             Pertinent Vitals/Pain Pain Assessment: No/denies pain    Home Living Family/patient expects to be discharged to:: Private residence Living Arrangements: Alone   Type of Home: House       Home Layout: One level Home Equipment: Crutches      Prior Function Level of Independence:  Independent               Hand Dominance        Extremity/Trunk Assessment   Upper Extremity Assessment: Overall WFL for tasks assessed           Lower Extremity Assessment: Overall WFL for tasks assessed      Cervical / Trunk Assessment: Normal  Communication   Communication: No difficulties  Cognition Arousal/Alertness: Awake/alert Behavior During Therapy: WFL for tasks assessed/performed Overall Cognitive Status: Within Functional Limits for tasks assessed                      General Comments      Exercises        Assessment/Plan    PT Assessment Patent does not need any further PT services  PT Diagnosis     PT Problem List    PT Treatment Interventions     PT Goals (Current goals can be found in the Care Plan section) Acute Rehab PT Goals PT Goal Formulation: All assessment and education complete, DC therapy    Frequency     Barriers to discharge        Co-evaluation               End of Session   Activity Tolerance: Patient tolerated  treatment well Patient left: in bed;with call bell/phone within reach           Time: 0950-1010 PT Time Calculation (min) (ACUTE ONLY): 20 min   Charges:   PT Evaluation $Initial PT Evaluation Tier I: 1 Procedure     PT G Codes:        DONAWERTH,KAREN 08-May-2015, 10:12 AM

## 2015-04-09 NOTE — Care Management Important Message (Signed)
Important Message  Patient Details  Name: [name] [name] MRN: [id number] Date of Birth: 08-14-1950   Medicare Important Message Given:  [facility] notification given    Camillo Flaming 04/09/2015, 10:52 AMImportant Message  Patient Details  Name: [name] [name] MRN: [id number] Date of Birth: 1950-10-08   Medicare Important Message Given:  Yes-second notification given    Camillo Flaming 04/09/2015, 10:52 AM

## 2015-04-09 NOTE — Evaluation (Signed)
Occupational Therapy One Time Evaluation Patient Details Name: [name] [name] MRN: [id number] DOB: 1951/04/21 Today's Date: 04/09/2015    History of Present Illness 64 year old female with past medical history of recent hospitalization for right middle lobe pneumonia complicated by right pleural effusion status post thoracentesis. Despite multiple courses of IV and by mouth antibiotics, pneumonia has not resolved. Previous cultures and cytology negative for bacteria or malignancy. Patient readmitted on 04/02/15 for recurrent shortness of breath and worsening pneumonia. Pt then with testing that revealed she has rheumatoid arthritis and is being treated wtih IV steroids.   Clinical Impression   Pt provided joint protection strategies/handout and reviewed information with pt. Also issued pt a wash mitt and pt thought this would be helpful with bathing. Educated on where to obtain another wash mitt if desired. Also educated on foam tubing to add to utensil handle or toothbrush to help with decreasing pressure on digit joints. No further questions expressed by pt for ADL concerns. Will sign off.     Follow Up Recommendations  No OT follow up    Equipment Recommendations  None recommended by OT    Recommendations for Other Services       Precautions / Restrictions Precautions Precautions: Fall Restrictions Weight Bearing Restrictions: No      Mobility Bed Mobility Overal bed mobility: Independent             General bed mobility comments: To EOB for transport.   Transfers                     Balance                                            ADL                                         General ADL Comments: Pt modified independent/independent with mobility per PT note. Pt expressed no concerns regarding ability to to perform basic ADL of bathing, dressing, toileting for home. She states she has a grab bar on tub at home. She  states her commode is high enough to transfer on and off of. Pt reports her hands have been giving her difficulty with using her OJ juicer (to squeeze). discussed AE pieces that can help her with the arthitis management including a wash mitt and red foam grip for her toothbrush. Issued wash mitt and showed pt how this works and she is pleased with item. Paged rehab tech to bring red foam tubing for pt as brown foam  was too small. Issued some printed information regarding joint protection strategies including using larger rather than small joints when possible, sliding pots and pans in kitchen along counter rather than carrying them across room, importance of rest, etc. Transport coming for pt to go to a test so session ended a little early. Asked pt if she would like OT to return next date or if she fel this information was helpful or had questions. She states she feels comfortable with education and doesnt need OT to return.      Vision     Perception     Praxis      Pertinent Vitals/Pain Pain Assessment: No/denies pain     Hand Dominance Right  Extremity/Trunk Assessment Upper Extremity Assessment Upper Extremity Assessment: RUE deficits/detail;LUE deficits/detail RUE Deficits / Details: not able to fully make a fist or squeeze due to new arthritic pain. She states grip is much improved since admission. Grossly WFL througout rest of joints. LUE Deficits / Details: as above. grossly WFL througout rest of joints.       Cervical / Trunk Assessment Cervical / Trunk Assessment: Normal   Communication Communication Communication: No difficulties   Cognition Arousal/Alertness: Awake/alert Behavior During Therapy: WFL for tasks assessed/performed Overall Cognitive Status: Within Functional Limits for tasks assessed                     General Comments       Exercises       Shoulder Instructions      Home Living Family/patient expects to be discharged to:: Private  residence Living Arrangements: Alone   Type of Home: [place]: One level     Bathroom Shower/Tub: Teacher, early years/pre: Handicapped height     Home Equipment: Crutches;Grab bars - tub/shower          Prior Functioning/Environment Level of Independence: Independent        Comments: pt states her hands have been giving her more difficulty X 1 week with trying to cook, squeeze orange juice maker, etc.     OT Diagnosis: Generalized weakness   OT Problem List:     OT Treatment/Interventions:      OT Goals(Current goals can be found in the care plan section) Acute Rehab OT Goals Patient Stated Goal: home tomorrow hopefully OT Goal Formulation: With patient  OT Frequency:     Barriers to D/C:            Co-evaluation              End of Session    Activity Tolerance: Patient tolerated treatment well Patient left:  (EOB with transport.)   Time: 2947-6546 OT Time Calculation (min): 13 min Charges:  OT General Charges $OT Visit: 1 Procedure OT Evaluation $Initial OT Evaluation Tier I: 1 Procedure G-Codes:    Jules Schick  [phone] 04/09/2015, 12:53 PM

## 2015-04-09 NOTE — Progress Notes (Signed)
ANTICOAGULATION CONSULT NOTE - Initial Consult  Pharmacy Consult for Lovenox Indication: VTE prophylaxis  Allergies  Allergen Reactions  . Levaquin [Levofloxacin]     Joint pain/soreness, pain in hands, calf's.  . Cephalexin Rash    Patient Measurements: Height: 5' 6.5" (168.9 cm) Weight: 132 lb 4.4 oz (60 kg) IBW/kg (Calculated) : 60.45 Heparin Dosing Weight:   Vital Signs: Temp: 98.2 F (36.8 C) (09/26 0545) Temp Source: Oral (09/26 0545) BP: 125/50 mmHg (09/26 0545) Pulse Rate: 70 (09/26 0545)  Labs:  Recent Labs  04/07/15 0340 04/07/15 0913 04/08/15 0924 04/09/15 0423  HGB  --  10.8*  --  10.9*  HCT  --  33.5*  --  33.2*  PLT  --  705*  --  745*  CREATININE 0.47  --  0.50 0.37*    Estimated Creatinine Clearance: 66.4 mL/min (by C-G formula based on Cr of 0.37).   Medical History: Past Medical History  Diagnosis Date  . Atrophic vaginitis   . Elevated cholesterol   . Osteoporosis 2015    based on hip fracture from fall  DEXA 2014 T score -2.1 FRAX 19%/1.7%  . Pneumonia 03/2015    R  . Pleural effusion 9/206    Bilateral effusions, s/p thora on R c/w exudate   Assessment: 70 yoF with past medical history of recent hospitalization for right middle lobe pneumonia complicated by right pleural effusion status post thoracentesis now with new diagnosis of rheumatoid arthritis with joint pain being treated with IV solu-medrol.  Pt has been on SQ heparin and pharmacy consulted to transition to [place] for VTE prophylaxis.  CrCl > 30 ml/mlin.  Wt 60kg.  CBC shows low but stable hemoglobin, elevated plts.  No bleeding issues noted.    Goal of Therapy:  Anti-Xa level 0.3-0.6units/ml  4 hours after last dose of lovenox Monitor platelets by anticoagulation protocol: Yes   Plan:  Start Lovenox 40mg  SQ q24h 8 hours after last heparin SQ dose.  Need for further dosage adjustment appears unlikely at present. Will sign off at this time.  Please reconsult if a change in  clinical status warrants re-evaluation of dosage.  Thanks! Ralene Bathe, PharmD, BCPS 04/09/2015, 12:39 PM  Pager: [phone]

## 2015-04-10 ENCOUNTER — Encounter: Payer: Self-pay | Admitting: *Deleted

## 2015-04-10 MED ORDER — MELOXICAM 15 MG PO TABS: 15.0000 mg | ORAL_TABLET | Freq: Every day | ORAL | Status: DC | PRN

## 2015-04-10 MED ORDER — MELOXICAM 15 MG PO TABS: 15.0000 mg | ORAL_TABLET | Freq: Every day | ORAL | Status: DC

## 2015-04-10 MED ORDER — PREDNISONE 20 MG PO TABS: 60.0000 mg | ORAL_TABLET | Freq: Every day | ORAL | Status: DC

## 2015-04-10 MED ORDER — OMEPRAZOLE MAGNESIUM 20 MG PO TBEC: 20.0000 mg | DELAYED_RELEASE_TABLET | Freq: Every day | ORAL | Status: DC

## 2015-04-10 NOTE — Telephone Encounter (Signed)
Spoke with pt, states that MW had not yet filled out short term disability forms.  Pt's office has received her FMLA forms but not her short term disability forms.   lmtcb X1 with Dewaine Oats in medical records to check on status of forms. MW please advise if you've filled out short term disability forms on this patient.  Thanks!

## 2015-04-10 NOTE — Telephone Encounter (Signed)
Spoke with pt, wants to know if MW can write patient out of her short term disability until after her 04/23/2015 appt.  Pt will need a letter for this.   MW are you ok with writing this letter, as well as filling out the short term disability forms?  Thanks!

## 2015-04-10 NOTE — Telephone Encounter (Signed)
Pt cb, Wants to know if MW can send note to short term disability stating she can no return to work until after appt sched for 04/23/15 that should help her keep short term disability until forms are complete. [phone]

## 2015-04-10 NOTE — Care Management Note (Signed)
Case Management Note  Patient Details  Name: [name] [name] MRN: [id number] Date of Birth: 01/01/1951  Subjective/Objective:            64 yo admitted with Bacterial Lobar PNA        Action/Plan: From home alone  Expected Discharge Date:   (unknown)               Expected Discharge Plan:  Home/Self Care  In-House Referral:     Discharge planning Services  CM Consult  Post Acute Care Choice:    Choice offered to:     DME Arranged:    DME Agency:     HH Arranged:    [place] Agency:     Status of Service:  Completed, signed off  Medicare Important Message Given:  Yes-second notification given Date Medicare IM Given:    Medicare IM give by:    Date Additional Medicare IM Given:    Additional Medicare Important Message give by:     If discussed at [place] of Stay Meetings, dates discussed:    Additional Comments: Chart reviewed for DC needs. PT did not recommend any PT follow up. No CM needs identified. Lynnell Catalan, RN 04/10/2015, 1:15 PM

## 2015-04-10 NOTE — Telephone Encounter (Signed)
Pt called back. MW has filled out and signed short term disability and sent this down to [employer] along with work note for pt. Pt is aware and nothing further needed

## 2015-04-10 NOTE — Telephone Encounter (Signed)
[email or web address] x1 

## 2015-04-10 NOTE — Telephone Encounter (Signed)
That's fine

## 2015-04-10 NOTE — Progress Notes (Signed)
Discharge instructions completed with patient. Medication regimen reviewed.

## 2015-04-10 NOTE — Discharge Instructions (Signed)
Rheumatoid Arthritis °Rheumatoid arthritis is a long-term (chronic) inflammatory disease that causes pain, swelling, and stiffness of the joints. It can affect the entire body, including the eyes and lungs. The effects of rheumatoid arthritis vary widely among those with the condition. °CAUSES  °The cause of rheumatoid arthritis is not known. It tends to run in families and is more common in women. Certain cells of the body's natural defense system (immune system) do not work properly and begin to attack healthy joints. It primarily involves the connective tissue that lines the joints (synovial membrane). This can cause damage to the joint. °SYMPTOMS  °· Pain, stiffness, swelling, and decreased motion of many joints, especially in the hands and feet. °· Stiffness that is worse in the morning. It may last 1-2 hours or longer. °· Numbness and tingling in the hands. °· Fatigue. °· Loss of appetite. °· Weight loss. °· Low-grade fever. °· Dry eyes and mouth. °· Firm lumps (rheumatoid nodules) that grow beneath the skin in areas such as the elbows and hands. °DIAGNOSIS  °Diagnosis is based on the symptoms described, an exam, and blood tests. Sometimes, X-rays are helpful. °TREATMENT  °The goals of treatment are to relieve pain, reduce inflammation, and to slow down or stop joint damage and disability. Methods vary and may include: °· Maintaining a balance of rest, exercise, and proper nutrition. °· Medicines: °¨ Pain relievers (analgesics). °¨ Corticosteroids and nonsteroidal anti-inflammatory drugs (NSAIDs) to reduce inflammation. °¨ Disease-modifying antirheumatic drugs (DMARDs) to try to slow the course of the disease. °¨ Biologic response modifiers to reduce inflammation and damage. °· Physical therapy and occupational therapy. °· Surgery for patients with severe joint damage. Joint replacement or fusing of joints may be needed. °· Routine monitoring and ongoing care, such as office visits, blood and urine tests, and  X-rays. °HOME CARE INSTRUCTIONS  °· Remain physically active and reduce activity when the disease gets worse. °· Eat a well-balanced diet. °· Put heat on affected joints when you wake up and before activities. Keep the heat on the affected joint for as long as directed by your health care provider. °· Put ice on affected joints following activities or exercising. °¨ Put ice in a plastic bag. °¨ Place a towel between your skin and the bag. °¨ Leave the ice on for 15-20 minutes, 3-4 times per day, or as directed by your health care provider. °· Take medicines and supplements only as directed by your health care provider. °· Use splints as directed by your health care provider. Splints help maintain joint position and function. °· Do not sleep with pillows under your knees. This may lead to spasms. °· Participate in a self-management program to keep current with the latest treatment and coping skills. °SEEK IMMEDIATE MEDICAL CARE IF: °· You have fainting episodes. °· You have periods of extreme weakness. °· You rapidly develop a hot, painful joint that is more severe than usual joint aches. °· You have chills. °· You have a fever. °FOR MORE INFORMATION  °· [employer]: www.rheumatology.org °· [employer]: www.arthritis.org °Document Released: 06/27/2000 Document Revised: 11/14/2013 Document Reviewed: 08/06/2011 °[employer]® Patient Information ©2015 [employer]. This information is not intended to replace advice given to you by your health care provider. Make sure you discuss any questions you have with your health care provider. ° °

## 2015-04-11 NOTE — Discharge Summary (Signed)
Discharge Summary  [name] [name] TDV:[id number] DOB: 1951-07-07  PCP: Criselda Peaches, MD  Admit date: 04/03/2015 Discharge date: 04/10/2015  Time spent: 25 minutes  Recommendations for Outpatient Follow-up:  1. Patient given referral to establish with Garret Reddish as PCP 2.  New medication: Prednisone 60 mg by mouth daily 3. Patient given referral to follow up with Yehuda Mao, Rheumatology 4. She will follow-up with pulmonary as outpatient/appointment made 5. new medication: Mobic by mouth daily  Discharge Diagnoses:  Active Hospital Problems   Diagnosis Date Noted  . Rheumatoid arthritis involving multiple joints 04/09/2015  . HCAP (healthcare-associated pneumonia)   . Paroxysmal atrial fibrillation   . Bacterial lobar pneumonia 03/21/2015  . Sepsis 03/18/2015  . Hyperlipidemia 11/14/2013    Resolved Hospital Problems   Diagnosis Date Noted Date Resolved  No resolved problems to display.    Discharge Condition: Improved, being discharged home  Diet recommendation: Regular diet  Filed Weights   04/04/15 1400  Weight: 60 kg (132 lb 4.4 oz)    History of present illness:  64 year old female with past medical history of recent hospitalization for right middle lobe pneumonia complicated by right pleural effusion status post thoracentesis. Despite multiple courses of IV and by mouth antibiotics, pneumonia has not resolved. Previous cultures and cytology negative for bacteria or malignancy. Patient readmitted on 9/19 for recurrent shortness of breath and worsening pneumonia. On admission, patient noted to be in rapid A. fib, however she converted to normal sinus even prior to starting [facility] Course:     HCAP (healthcare-associated pneumonia)/persistent bacterial lobar pneumonia: Pulmonary consulted. Started on broad-spectrum antibiotics. Underwent bronchoscopy with cultures and pathology negative. Felt to be initially fungal infection versus resistant  bacteria, but now in the setting of joint flare up, felt more to be autoimmune. Patient completed full course of antibiotics prior to discharge.    Paroxysmal atrial fibrillation: Echocardiogram done previous admission one week prior noted normal atria and size. Given that patient is currently in sinus rhythm, she was monitored and stayed in normal sinus rhythm. Her chads 2 vasc score was only 2 and patient hesitant to start on any type of anticoagulation.    /Multiple joint pain/Rheumatoid arthritis involving multiple joints: 1-2 days prior to admission, patient start having issues with significant joint pain and swelling which was new involving her MCP, DIP, PIP, wrist, bilateral shoulders and right hip. CRP and sedimentation rate elevated. Lab work checked consistent with rheumatoid arthritis flareup. Felt to be secondary to rheumatoid arthritis flareup. Patient's are not Solu-Medrol and had significant improvement area steroids change over to by mouth. She'll follow up with outpatient rheumatology and be discharged on prednisone 60 mg (1 mg/kg) and stay on that dose until follow-up.   leukocytosis: Secondary to steroids, no signs of acute infectious process  Procedures:  Bronchoscopy done 9/22: Cultures and samples negative  Consultations:  Pulmonary  Discharge Exam: BP 123/60 mmHg  Pulse 77  Temp(Src) 98.1 F (36.7 C) (Oral)  Resp 16  Ht 5' 6.5" (1.689 m)  Wt 60 kg (132 lb 4.4 oz)  BMI 21.03 kg/m2  SpO2 98%  LMP 07/14/1993  General: Alert and oriented 3, no acute distress Cardiovascular: Regular rate and rhythm, S1-S2 Respiratory: Clear to auscultation bilaterally  Discharge Instructions You were cared for by a hospitalist during your hospital stay. If you have any questions about your discharge medications or the care you received while you were in the hospital after you are discharged, you can call the unit  and asked to speak with the hospitalist on call if the hospitalist  that took care of you is not available. Once you are discharged, your primary care physician will handle any further medical issues. Please note that NO REFILLS for any discharge medications will be authorized once you are discharged, as it is imperative that you return to your primary care physician (or establish a relationship with a primary care physician if you do not have one) for your aftercare needs so that they can reassess your need for medications and monitor your lab values.  Discharge Instructions    Diet - low sodium heart healthy    Complete by:  As directed      Increase activity slowly    Complete by:  As directed             Medication List    STOP taking these medications        amoxicillin-clavulanate 875-125 MG tablet  Commonly known as:  AUGMENTIN     ibuprofen 200 MG tablet  Commonly known as:  ADVIL,MOTRIN     levofloxacin 750 MG tablet  Commonly known as:  LEVAQUIN      TAKE these medications        DELSYM COUGH RELIEF MT  Use as directed 5 mLs in the mouth or throat every 6 (six) hours as needed (cough/cold).     meloxicam 15 MG tablet  Commonly known as:  MOBIC  Take 1 tablet (15 mg total) by mouth daily as needed for pain.     omeprazole 20 MG tablet  Commonly known as:  PRILOSEC OTC  Take 1 tablet (20 mg total) by mouth daily.     predniSONE 20 MG tablet  Commonly known as:  DELTASONE  Take 3 tablets (60 mg total) by mouth daily with breakfast.     traMADol 50 MG tablet  Commonly known as:  ULTRAM  Take 1-2 tablets by mouth every 4 (four) hours as needed. pain       Allergies  Allergen Reactions  . Levaquin [Levofloxacin]     Joint pain/soreness, pain in hands, calf's.  . Cephalexin Rash       Follow-up Information    Follow up with Christinia Gully, MD On 04/23/2015.   Specialty:  Pulmonary Disease   Why:  Appt at 11:30 am    Contact information:   [address] [place] [address] [phone]       Follow up with  Yehuda Mao, MD.   Specialty:  Internal Medicine   Why:  The office will call you to schedule an appointment   Contact information:   [address] [place] [address] [phone]        The results of significant diagnostics from this hospitalization (including imaging, microbiology, ancillary and laboratory) are listed below for reference.    Significant Diagnostic Studies: Dg Chest 2 View  04/09/2015   CLINICAL DATA:  Rheumatoid lung disease, cough, shortness of Breath  EXAM: CHEST  2 VIEW  COMPARISON:  04/05/2015  FINDINGS: Consolidation in the right middle lobe at the right lung base with small right pleural effusion, stable. Small left effusion also noted with minimal left base opacity, likely atelectasis. Heart is normal size. Underlying COPD. No acute bony abnormality.  IMPRESSION: Stable right lower lobe infiltrate concerning for pneumonia. Small right effusion.  Minimal left base atelectasis with small left effusion.  COPD.   Electronically Signed   By: Rolm Baptise M.D.  On: 04/09/2015 09:36   Dg Chest 2 View  04/03/2015   CLINICAL DATA:  Follow-up thoracentesis.  Right pleural effusion.  EXAM: CHEST  2 VIEW  COMPARISON:  03/29/2015.  FINDINGS: Mediastinum hilar structures normal. Persistent right lower lobe infiltrate. Small right pleural effusion. No pneumothorax. Left lung base subsegmental atelectasis . Small left pleural effusion present. Heart size normal. No acute bony abnormality.  IMPRESSION: 1. Persistent right lower lobe infiltrate. 2. Small residua right pleural effusion. No no pneumothorax post thoracentesis. 3. Left lung base subsegmental atelectasis. Small left pleural effusion.   Electronically Signed   By: Marcello Moores  Register   On: 04/03/2015 09:53   Dg Chest 2 View  03/29/2015   CLINICAL DATA:  Status post right side thoracentesis. Pt is coughing and is somewhat SOB post procedure.  EXAM: CHEST  2 VIEW  COMPARISON:  03/27/2015  FINDINGS: Heart  size and vascular pattern are normal. Left lung is clear. Consolidation right base remains unchanged. Decreased right pleural effusion. No pneumothorax.  IMPRESSION: No pneumothorax status post right-sided thoracentesis. Continued consolidation right lower lobe stable.   Electronically Signed   By: Skipper Cliche M.D.   On: 03/29/2015 14:31   Dg Chest 2 View  03/27/2015   CLINICAL DATA:  Bacterial lobar pneumonia.  EXAM: CHEST  2 VIEW  COMPARISON:  March 22, 2015.  FINDINGS: The heart size and mediastinal contours are within normal limits. No pneumothorax is noted. Left lung is clear. Stable right middle lobe airspace opacity is noted consistent with pneumonia with small associated pleural effusion. The visualized skeletal structures are unremarkable.  IMPRESSION: Stable right middle lobe pneumonia with associated small pleural effusion.   Electronically Signed   By: Marijo Conception, M.D.   On: 03/27/2015 09:49   Dg Chest 2 View  03/22/2015   CLINICAL DATA:  Right lower chest pain fever this morning, recently hospitalized with pneumonia  EXAM: CHEST  2 VIEW  COMPARISON:  Chest x-ray chest CT scan of March 18, 2015  FINDINGS: There has been interval increase in the conspicuity of the dense right middle lobe/ anterior right lower lobe pneumonia. The right upper lobe and left lung are clear. The heart and pulmonary vascularity are normal. The mediastinum is normal in width. There is stable mild dextrocurvature centered in the mid thoracic spine.  IMPRESSION: Worsening of right middle lobe and anterior right lower lobe pneumonia. The infiltrate demonstrated on the earlier CT in the posterior aspect of the right lower lobe is not evident today.   Electronically Signed   By: David  Martinique M.D.   On: 03/22/2015 12:53   Dg Chest 2 View  03/18/2015   CLINICAL DATA:  Cough, shortness of breath, chest congestion, fever for the past 7 days. Followup pneumonia.  EXAM: CHEST  2 VIEW  COMPARISON:  03/14/2015.   FINDINGS: Minimal increase in patchy airspace opacity at the right lung base on the frontal view. This appears slightly less prominent on the lateral view and is currently seen below the major fissure. Clear left lung. The lungs remain mildly hyperexpanded. Normal sized heart. Stable mild scoliosis.  IMPRESSION: 1. Little change in anterior right lower lobe pneumonia. 2. Stable mild changes of COPD.   Electronically Signed   By: Claudie Revering M.D.   On: 03/18/2015 17:30   Dg Chest 2 View  03/14/2015   CLINICAL DATA:  Pneumonia.  High fever last night.  Cough.  EXAM: CHEST - 2 VIEW  COMPARISON:  One-view chest 11/14/2013  FINDINGS: The heart size is normal. Emphysematous changes are again noted. New airspace opacity is evident inferiorly in the right middle lobe. No other focal airspace consolidation is present. There is no edema or effusion. Mild rightward curvature is present in the thoracic spine. Endplate degenerative changes are noted. Atherosclerotic calcifications are again seen within the thoracic aorta. The visualized soft tissues and bony thorax are unremarkable otherwise.  IMPRESSION: 1. New right middle lobe pneumonia. 2. Emphysema.   Electronically Signed   By: San Morelle M.D.   On: 03/14/2015 13:32   Ct Chest W Contrast  03/28/2015   CLINICAL DATA:  Recent community acquired pneumonia with hospitalization. Evaluate for empyema. Persistent low-grade fever and cough.  EXAM: CT CHEST WITH CONTRAST  TECHNIQUE: Multidetector CT imaging of the chest was performed during intravenous contrast administration.  CONTRAST:  62mL OMNIPAQUE IOHEXOL 300 MG/ML  SOLN  COMPARISON:  03/18/2015.  FINDINGS: Mediastinum/Nodes: No pathologically enlarged mediastinal, hilar or axillary lymph nodes. Heart size normal. No pericardial effusion.  Lungs/Pleura: Small right pleural effusion with minimal loculation and associated compressive atelectasis in the right lower lobe. There are patchy areas of airspace  consolidation bilaterally, slightly progressive from the prior exam. There may be slight necrosis within consolidation adjacent to the carina (series 2, image 20). At least 1 new area of subpleural consolidation is seen in the right middle lobe (series 3, image 30). Airway is unremarkable.  Upper abdomen: Visualized portions of the liver, adrenal glands, left kidney, spleen, pancreas and stomach are grossly unremarkable. No upper abdominal adenopathy.  Musculoskeletal: No worrisome lytic or sclerotic lesions.  IMPRESSION: 1. Slight progression in multilobar airspace consolidation/pneumonia. Followup to clearing is recommended to exclude underlying malignancy. 2. New right pleural effusion with minimal loculation. Developing empyema could have this appearance.   Electronically Signed   By: Lorin Picket M.D.   On: 03/28/2015 16:56   Ct Angio Chest W/cm &/or Wo Cm  04/03/2015   CLINICAL DATA:  Shortness of breath, cough.  EXAM: CT ANGIOGRAPHY CHEST WITH CONTRAST  TECHNIQUE: Multidetector CT imaging of the chest was performed using the standard protocol during bolus administration of intravenous contrast. Multiplanar CT image reconstructions and MIPs were obtained to evaluate the vascular anatomy.  CONTRAST:  17mL OMNIPAQUE IOHEXOL 350 MG/ML SOLN  COMPARISON:  Chest radiograph of same day. CT scan of March 28, 2015.  FINDINGS: No pneumothorax is noted. Small bilateral pleural effusions are noted posteriorly. Stable right middle lobe opacity is noted with air bronchograms concerning for pneumonia. Mild bilateral posterior basilar subsegmental atelectasis is noted. Atelectasis or inflammation is noted anteriorly in the lingular segment of the left upper lobe. Stable opacity is noted medially in the left upper lobe concerning for inflammation. Stable consolidation is noted in the retro hilar region of the right upper lobe.  There is no evidence of thoracic aortic dissection or aneurysm. There is no evidence of  pulmonary embolus. Visualized portion of upper abdomen is unremarkable.  Review of the MIP images confirms the above findings.  IMPRESSION: No evidence of pulmonary embolus.  Stable multifocal airspace opacities are noted, most prominently seen in the right middle lobe, consistent with pneumonia. Continued CT follow-up is recommended to ensure resolution and rule out underlying neoplasm.  Mild bilateral posterior basilar subsegmental atelectasis is noted. Small bilateral pleural effusions are noted posteriorly.   Electronically Signed   By: Marijo Conception, M.D.   On: 04/03/2015 14:51   Ct Angio Chest Pe W/cm &/or Wo Cm  03/18/2015  CLINICAL DATA:  64 year old female with fever and nonproductive cough.  EXAM: CT ANGIOGRAPHY CHEST WITH CONTRAST  TECHNIQUE: Multidetector CT imaging of the chest was performed using the standard protocol during bolus administration of intravenous contrast. Multiplanar CT image reconstructions and MIPs were obtained to evaluate the vascular anatomy.  CONTRAST:  118mL OMNIPAQUE IOHEXOL 350 MG/ML SOLN  COMPARISON:  Radiograph dated 03/18/2015  FINDINGS: Evaluation of this exam is limited due to respiratory motion artifact.  There are patchy areas of ground-glass airspace opacity predominantly involving the right lower and right middle lobe with smaller areas involving the upper lobes bilaterally most compatible with multifocal pneumonia. Clinical correlation and follow-up to resolution is recommended. There is mild emphysematous changes of the lungs. There is no pleural effusion. The central airways are patent.  The visualized thoracic aorta appear unremarkable. No CT evidence of pulmonary embolism. There is no cardiomegaly or pericardial effusion. There is right hilar adenopathy. The visualized esophagus and thyroid gland appear unremarkable. There is no axillary adenopathy. The thoracic wall soft tissues are unremarkable. There is degenerative changes of the spine. No acute fracture.  A small focal area of sclerosis along the anterior aspect of the T8 along the superior endplate is likely related to chronic changes. The visualized upper abdomen appear unremarkable.  Review of the MIP images confirms the above findings.  IMPRESSION: No CT evidence of pulmonary embolism.  Multifocal pneumonia. Clinical correlation and follow-up to resolution recommended.   Electronically Signed   By: Anner Crete M.D.   On: 03/18/2015 22:52   Dg Chest Port 1 View  04/05/2015   CLINICAL DATA:  Post right lower lobe biopsy  EXAM: PORTABLE CHEST 1 VIEW  COMPARISON:  04/03/2015  FINDINGS: Cardiomediastinal silhouette is stable. Persistent patchy consolidation right base. Probable small bilateral pleural effusion. No pneumothorax. Persistent mild right suprahilar adenopathy.  IMPRESSION: Persistent patchy consolidation right base. Probable small bilateral pleural effusion. No pneumothorax.   Electronically Signed   By: Lahoma Crocker M.D.   On: 04/05/2015 12:08   US Thoracentesis Asp Pleural Space W/img Guide  03/29/2015   INDICATION: Symptomatic right sided pleural effusion  EXAM: US THORACENTESIS ASP PLEURAL SPACE W/IMG GUIDE  COMPARISON:  CT Chest 03/28/2015.  MEDICATIONS: None  COMPLICATIONS: None immediate  TECHNIQUE: Informed written consent was obtained from the patient after a discussion of the risks, benefits and alternatives to treatment. A timeout was performed prior to the initiation of the procedure.  Initial ultrasound scanning demonstrates a right pleural effusion. The lower chest was prepped and draped in the usual sterile fashion. 1% lidocaine was used for local anesthesia.  Under direct ultrasound guidance, a 19 gauge, 7-cm, Yueh catheter was introduced. An ultrasound image was saved for documentation purposes. The thoracentesis was performed. The catheter was removed and a dressing was applied. The patient tolerated the procedure well without immediate post procedural complication. The patient  was escorted to have an upright chest radiograph.  FINDINGS: A total of approximately 200 ml of serous fluid was removed. Requested samples were sent to the laboratory.  IMPRESSION: Successful ultrasound-guided right sided thoracentesis yielding 200 ml of pleural fluid.  Read By:  Tsosie Billing PA-C   Electronically Signed   By: Lucrezia Europe M.D.   On: 03/29/2015 14:44   Dg C-arm Bronchoscopy  04/05/2015   CLINICAL DATA:    C-ARM BRONCHOSCOPY  Fluoroscopy was utilized by the requesting physician.  No radiographic  interpretation.     Microbiology: Recent Results (from the past 240 hour(s))  Blood culture (routine x 2)     Status: None   Collection Time: 04/03/15  8:00 PM  Result Value Ref Range Status   Specimen Description BLOOD BLOOD LEFT FOREARM  Final   Special Requests BOTTLES DRAWN AEROBIC AND ANAEROBIC 5CC  Final   Culture   Final    NO GROWTH 5 DAYS Performed at [facility]    Report Status 04/08/2015 FINAL  Final  Blood culture (routine x 2)     Status: None   Collection Time: 04/03/15  8:00 PM  Result Value Ref Range Status   Specimen Description BLOOD RIGHT ANTECUBITAL  Final   Special Requests BOTTLES DRAWN AEROBIC ONLY 8ML  Final   Culture   Final    NO GROWTH 5 DAYS Performed at [facility]    Report Status 04/08/2015 FINAL  Final  MRSA PCR Screening     Status: None   Collection Time: 04/04/15  1:52 PM  Result Value Ref Range Status   MRSA by PCR NEGATIVE NEGATIVE Final    Comment:        The GeneXpert MRSA Assay (FDA approved for NASAL specimens only), is one component of a comprehensive MRSA colonization surveillance program. It is not intended to diagnose MRSA infection nor to guide or monitor treatment for MRSA infections.   Culture, sputum-assessment     Status: None   Collection Time: 04/04/15  2:29 PM  Result Value Ref Range Status   Specimen Description SPUTUM  Final   Special Requests NONE  Final   Sputum evaluation   Final     THIS SPECIMEN IS ACCEPTABLE. RESPIRATORY CULTURE REPORT TO FOLLOW.   Report Status 04/04/2015 FINAL  Final  Culture, respiratory (NON-Expectorated)     Status: None   Collection Time: 04/04/15  2:29 PM  Result Value Ref Range Status   Specimen Description SPUTUM  Final   Special Requests NONE  Final   Gram Stain   Final    ABUNDANT WBC PRESENT,BOTH PMN AND MONONUCLEAR NO SQUAMOUS EPITHELIAL CELLS SEEN NO ORGANISMS SEEN Performed at [facility]    Culture   Final    NO GROWTH 2 DAYS Performed at [facility]    Report Status 04/07/2015 FINAL  Final  AFB culture with smear     Status: None (Preliminary result)   Collection Time: 04/05/15 11:41 AM  Result Value Ref Range Status   Specimen Description BRONCHIAL ALVEOLAR LAVAGE  Final   Special Requests Normal  Final   Acid Fast Smear   Final    NO ACID FAST BACILLI SEEN Performed at [facility]    Culture   Final    CULTURE WILL BE EXAMINED FOR 6 WEEKS BEFORE ISSUING A FINAL REPORT Performed at [facility]    Report Status PENDING  Incomplete  Culture, respiratory (NON-Expectorated)     Status: None   Collection Time: 04/05/15 11:41 AM  Result Value Ref Range Status   Specimen Description BRONCHIAL ALVEOLAR LAVAGE  Final   Special Requests Normal  Final   Gram Stain   Final    FEW WBC PRESENT,BOTH PMN AND MONONUCLEAR NO SQUAMOUS EPITHELIAL CELLS SEEN NO ORGANISMS SEEN Performed at [facility]    Culture   Final    NO GROWTH 2 DAYS Performed at [facility]    Report Status 04/07/2015 FINAL  Final  Fungus Culture with Smear     Status: None (Preliminary result)   Collection Time: 04/05/15  11:41 AM  Result Value Ref Range Status   Specimen Description BRONCHIAL ALVEOLAR LAVAGE  Final   Special Requests Normal  Final   Fungal Smear   Final    NO YEAST OR FUNGAL ELEMENTS SEEN Performed at [facility]    Culture   Final    CULTURE IN PROGRESS FOR FOUR  WEEKS Performed at [facility]    Report Status PENDING  Incomplete     Labs: Basic Metabolic Panel:  Recent Labs Lab 04/05/15 0450 04/06/15 0812 04/07/15 0340 04/08/15 0924 04/09/15 0423  NA 139 138 138 134* 137  K 3.4* 3.6 3.3* 4.0 4.1  CL 100* 98* 100* 100* 101  CO2 28 30 30 26 26   GLUCOSE 106* 131* 115* 132* 164*  BUN 6 9 7 8 13   CREATININE 0.62 0.64 0.47 0.50 0.37*  CALCIUM 8.7* 8.7* 8.4* 8.5* 8.7*   Liver Function Tests:  Recent Labs Lab 04/06/15 0812 04/09/15 0423  AST 37 24  ALT 22 21  ALKPHOS 262* 322*  BILITOT 0.5 0.3  PROT 6.3* 6.4*  ALBUMIN 2.1* 2.1*   No results for input(s): LIPASE, AMYLASE in the last 168 hours. No results for input(s): AMMONIA in the last 168 hours. CBC:  Recent Labs Lab 04/05/15 0450 04/07/15 0913 04/09/15 0423  WBC 23.9* 20.3* 25.2*  NEUTROABS  --   --  22.6*  HGB 11.0* 10.8* 10.9*  HCT 33.6* 33.5* 33.2*  MCV 93.9 96.0 94.6  PLT 732* 705* 745*   Cardiac Enzymes: No results for input(s): CKTOTAL, CKMB, CKMBINDEX, TROPONINI in the last 168 hours. BNP: BNP (last 3 results) No results for input(s): BNP in the last 8760 hours.  ProBNP (last 3 results) No results for input(s): PROBNP in the last 8760 hours.  CBG: No results for input(s): GLUCAP in the last 168 hours.     Signed:  Annita Brod  [facility] 04/11/2015, 9:03 AM

## 2015-04-16 ENCOUNTER — Telehealth: Payer: Self-pay | Admitting: Internal Medicine

## 2015-04-16 DIAGNOSIS — M069 Rheumatoid arthritis, unspecified: Secondary | ICD-10-CM

## 2015-04-16 NOTE — Telephone Encounter (Signed)
Pt cb please fax referral to [phone] [facility] rheumatology [place] office

## 2015-04-16 NOTE — Telephone Encounter (Signed)
Patient says that Velna Hatchet [facility]) advised her that she needed to get an appointment with Rheumatology for Rheumatoid Arthritis.  Patient says that she needs referral from [facility] in order to see Dr. Gavin Pound at [facility].  Patient is coming for appointment next week and wants to know if she can get the referral prior to appointment with [facility] or does she need to wait until her appointment with [facility] next week before getting the referral?    Dr. Melvyn Novas, please advise.

## 2015-04-16 NOTE — Telephone Encounter (Signed)
Ok to refer.

## 2015-04-16 NOTE — Telephone Encounter (Signed)
lmtcb X1 for pt.  Referral placed.

## 2015-04-17 ENCOUNTER — Telehealth: Payer: Self-pay | Admitting: Internal Medicine

## 2015-04-17 NOTE — Telephone Encounter (Signed)
Spoke with pt. She is aware that the referral is been made. Nothing further was needed.

## 2015-04-17 NOTE — Telephone Encounter (Signed)
Spoke with Estill Bamberg, disability specialist with [employer] and answered questions needed for pt's disability application.  Nothing further needed at this time.

## 2015-04-20 ENCOUNTER — Ambulatory Visit (INDEPENDENT_AMBULATORY_CARE_PROVIDER_SITE_OTHER): Payer: Managed Care, Other (non HMO) | Admitting: Adult Health

## 2015-04-20 ENCOUNTER — Encounter: Payer: Self-pay | Admitting: Adult Health

## 2015-04-20 VITALS — BP 124/68 | Temp 99.3°F | Ht 66.5 in | Wt 128.0 lb

## 2015-04-20 DIAGNOSIS — Z7189 Other specified counseling: Secondary | ICD-10-CM | POA: Diagnosis not present

## 2015-04-20 DIAGNOSIS — Z7689 Persons encountering health services in other specified circumstances: Secondary | ICD-10-CM

## 2015-04-20 NOTE — Progress Notes (Addendum)
HPI:  [name] [name] is here to establish care. She is a very pleasant 64 year old female who  has a past medical history of Elevated cholesterol; Osteoporosis (2015); Pneumonia (03/2015); Pleural effusion (9/206); Skin cancer (melanoma) ([place]); and Migraines.. She works as a Transport planner at [employer].   Last PCP and physical: Fall 2015 with Dr. Levin Erp   Immunizations: UTD Diet: eats healthy Exercise: Exercises frequently. She loves being outside Colonoscopy:Need records Dexa: Needs records Pap Smear: Up to date Mammogram:2016  Followed by   Dermatology Pulmonology Rheumatologist Eye: [place] Dentist: Every three months  Has the following chronic problems that require follow up and concerns today:  Rheumatoid Lung Disease - She was seen in the hospital September of 2016 after having fever, progressive non productive cough, and syncope while climbing [facility]. She received multiple rounds of ABX in the hospital for perceived CAP. She had significant elevation in ESR and anti-CCP. While in the hospital she had also developed swelling of her MCP and wrist joints b/l. She was transitioned to Prednisone taper and endorses that she is feeling better. Continues to have dry cough and a "tickle" in her throat.    ROS negative for unless reported above: fevers, chills,feeling poorly, unintentional weight loss, hearing or vision loss, chest pain, palpitations, leg claudication, struggling to breath,Not feeling congested in the chest, no orthopenia, no cough,no wheezing, normal appetite, no soft tissue swelling, no hemoptysis, melena, hematochezia, hematuria, falls, loc, si, or thoughts of self harm.    Past Medical History  Diagnosis Date  . Atrophic vaginitis   . Elevated cholesterol   . Osteoporosis 2015    based on hip fracture from fall  DEXA 2014 T score -2.1 FRAX 19%/1.7%  . Pneumonia 03/2015    R  . Pleural effusion 9/206    Bilateral effusions, s/p  thora on R c/w exudate    Past Surgical History  Procedure Laterality Date  . Finger surgery    . Hip arthroplasty Right 11/14/2013    Procedure: RIGHT CANNULATED HIP PINNING;  Surgeon: Alta Corning, MD;  Location: [facility];  Service: Orthopedics;  Laterality: Right;  BIOMET  . Video bronchoscopy Bilateral 04/05/2015    Procedure: VIDEO BRONCHOSCOPY WITHOUT FLUORO;  Surgeon: Juanito Doom, MD;  Location: [facility];  Service: Cardiopulmonary;  Laterality: Bilateral;    Family History  Problem Relation Age of Onset  . Heart attack Father 4    Social History   Social History  . Marital Status: Divorced    Spouse Name: N/A  . Number of Children: N/A  . Years of Education: N/A   Social History Main Topics  . Smoking status: Never Smoker   . Smokeless tobacco: Never Used  . Alcohol Use: No  . Drug Use: No  . Sexual Activity: No   Other Topics Concern  . None   Social History Narrative     Current outpatient prescriptions:  .  omeprazole (PRILOSEC OTC) 20 MG tablet, Take 1 tablet (20 mg total) by mouth daily., Disp: 30 tablet, Rfl: 1 .  predniSONE (DELTASONE) 20 MG tablet, Take 3 tablets (60 mg total) by mouth daily with breakfast. (Patient taking differently: 60 mg. 10.7.2016: Take 50 mg for 1 week then decrease to 40 mg starting Wed.), Disp: 90 tablet, Rfl: 0 .  traMADol (ULTRAM) 50 MG tablet, Take 1-2 tablets by mouth every 4 (four) hours as needed. pain, Disp: , Rfl: 0  EXAM:  Filed Vitals:   04/20/15  1014  BP: 124/68  Temp: 99.3 F (37.4 C)    Body mass index is 20.35 kg/(m^2).  GENERAL: vitals reviewed and listed above, alert, oriented, appears well hydrated and in no acute distress. She is thin  HEENT: atraumatic, conjunttiva clear, no obvious abnormalities on inspection of external nose and ears  NECK: Neck is soft and supple without masses, no adenopathy or thyromegaly, trachea midline, no JVD. Normal range of motion.   LUNGS: clear to auscultation  bilaterally, no wheezes, rales or rhonchi, good air movement  CV: Regular rate and rhythm, 3/6 murmur. No gallops, or rubs. No carotid bruit and no peripheral edema.   MS: moves all extremities without noticeable abnormality. No edema noted  Abd: soft/nontender/nondistended/normal bowel sounds   Skin: warm and dry, no rash   Extremities: No clubbing, cyanosis, or edema. Capillary refill is WNL. Pulses intact bilaterally in upper and lower extremities.   Neuro: CN II-XII intact, sensation and reflexes normal throughout, 5/5 muscle strength in bilateral upper and lower extremities. Normal finger to nose. Normal rapid alternating movements.   PSYCH: pleasant and cooperative, no obvious depression or anxiety  ASSESSMENT AND PLAN:  1. Encounter to establish care - Follow up for CPE - Follow up sooner if needed - Continue with current prednisone therapy  - Keep all follow up appointments.  - Get records from Dr. Nyoka Cowden  -We reviewed the PMH, [place], FH, [place], Meds and Allergies. -We provided refills for any medications we will prescribe as needed. -We addressed current concerns per orders and patient instructions. -We have asked for records for pertinent exams, studies, vaccines and notes from previous providers. -We have advised patient to follow up per instructions below.   -Patient advised to return or notify a provider immediately if symptoms worsen or persist or new concerns arise.     Dorothyann Peng, AGNP

## 2015-04-20 NOTE — Patient Instructions (Signed)
It was great meeting you today and I am so happy you are on the team.   Make an appointment for a complete physical   Keep your follow up appointments with Rheumatology and Pulmonology   Follow up as needed and please let me know if you need anything.

## 2015-04-20 NOTE — Progress Notes (Signed)
Pre visit review using our clinic review tool, if applicable. No additional management support is needed unless otherwise documented below in the visit note. 

## 2015-04-22 ENCOUNTER — Telehealth: Payer: Self-pay | Admitting: Critical Care Medicine

## 2015-04-22 NOTE — Telephone Encounter (Signed)
Call from Ms. Haris 64 year old female recently hospitalized for PNA and found to have RA.  Now on steroids with taper.  Patient reports that she had a severe coughing spell and had and immediate sharpe pain in her posterior back.  She is not short of breath and can talk in complete sentences.    Suspect the patient has intercostal muscle strain from coughing.  She was encouraged to use application of heat and her miloxicam prescription given to her at discharge.    She is to see Dr. Melvyn Novas for followup on Monday AM.

## 2015-04-23 ENCOUNTER — Encounter: Payer: Self-pay | Admitting: Internal Medicine

## 2015-04-23 ENCOUNTER — Ambulatory Visit (INDEPENDENT_AMBULATORY_CARE_PROVIDER_SITE_OTHER)
Admission: RE | Admit: 2015-04-23 | Discharge: 2015-04-23 | Disposition: A | Discharge status: Home / Self Care | Payer: Managed Care, Other (non HMO) | Source: Ambulatory Visit | Attending: Internal Medicine | Admitting: Internal Medicine

## 2015-04-23 ENCOUNTER — Ambulatory Visit (INDEPENDENT_AMBULATORY_CARE_PROVIDER_SITE_OTHER): Payer: Managed Care, Other (non HMO) | Admitting: Internal Medicine

## 2015-04-23 VITALS — BP 140/78 | HR 90 | Ht 66.5 in | Wt 128.0 lb

## 2015-04-23 DIAGNOSIS — J189 Pneumonia, unspecified organism: Secondary | ICD-10-CM

## 2015-04-23 DIAGNOSIS — J948 Other specified pleural conditions: Secondary | ICD-10-CM

## 2015-04-23 DIAGNOSIS — J918 Pleural effusion in other conditions classified elsewhere: Principal | ICD-10-CM

## 2015-04-23 DIAGNOSIS — M779 Enthesopathy, unspecified: Secondary | ICD-10-CM | POA: Diagnosis not present

## 2015-04-23 NOTE — Progress Notes (Signed)
Quick Note:  Spoke with pt. Informed her of the results and recs per MW. Pt verbalized understanding and denied any further questions or concerns at this time.   ______

## 2015-04-23 NOTE — Progress Notes (Signed)
Quick Note:  LMTCB ______ 

## 2015-04-23 NOTE — Progress Notes (Signed)
Subjective:    Patient ID: [name] [name], female    DOB: Feb 02, 1951,    MRN: [id number]    Brief patient profile:  26 yowf never smoker works at [employer] able hike at [employer] abruptly ill rx zpak Tmax 101.9  then 6 days later admitted:   Admit date: 03/18/2015 Discharge date: 03/21/2015  Recommendations for Outpatient Follow-up:  #1 Discharge home with outpatient PCP follow-up in 1 week #2 Patient with complete total seven-day course of Levaquin on 9/11. Patient will need a follow-up chest x-ray in 4 weeks to ensure resolution.  Discharge Diagnoses:  Principal problem Sepsis secondary to bacterial lobar pneumonia  Active Problems:  Hyperlipidemia  Orthostatic syncope   Discharge Condition:Fair  Diet recommendation: Regular  Filed Weights   03/18/15 2108  Weight: 60.6 kg (133 lb 9.6 oz)    History of present illness:  Please refer to admission H&P for details, in brief, 64 year old female presented to the ED with one week history of fever with nonproductive cough and pain over her shoulder blades. On 8/31 patient was diagnosed with cognitive by pneumonia and treated with a course of Z-Pak .however patient continued to have low-grade fever and poor appetite. On the day of admission she got up to go to the bathroom, felt hot and shortly thereafter had a syncopal episode. Patient also complaining of dull pain in her shoulder blade. In the ED patient was found to have a fever of 100.32F with WBC of 16.6 and chest x-ray showing right lower lobe pneumonia . Patient met criteria for sepsis and admitted to medical floor.   Hospital Course:  Sepsis secondary to multifocal bacterial pneumonia Patient started on empiric vancomycin and Zosyn given sepsis and possible failed outpatient antibiotic therapy. She remains afebrile for the past 24 hours . Sepsis has resolved.. Cough and back pain improving. Still has leukocytosis. Patient clinically improved.Blood cultures,  urine for strep antigen and Legionella negative. CT angiogram of the chest on admission negative for PE. Patient will be discharged on oral Levaquin to complete a total 7 day course of antibiotics. Antitussives prescribed.  Upper back pain Appears to be secondary to pleurisy. Recommend to continue Tylenol when necessary  Orthostatic syncope.  Improved with IV fluids. A carotid Dopplers unremarkable.  Diarrhea Possibly associated with infection class antibiotics. C. difficile negative. resolved.  Protein calorie malnutrition Habits supplement  Hyperlipidemia Continue statin       03/22/2015 new pt consultation/ Jeff Frieden   Chief Complaint  Patient presents with  . Pulmonary Consult    Referred by Dr. Zada Girt.  Pt states that she was dxed with PNA 03/14/15. She c/o low grade fever and cough-non prod.     tmax 101.9 was the day after Zmax started  Cough better >  Scant yellow mucus started  New R cp started 03/21/15 / has not tried nsaids Mild nausea since 9/4 poor appetite, no vomiting rec  Try advil 200 mg three with meals as needed for fever or pain (tylenol can be used to supplement if needed)  Return first of the week for follow up cxr      03/27/2015 f/u ov/Hyland Mollenkopf re: cap with R pleuritic cp  Chief Complaint  Patient presents with  . Follow-up    CXR done today. Pt states cough is unchanged "but does not hurt" - still non prod.   much better w/in 24 h of starting advil with no more fever, no more R pleuritic cp, still dry hacking cough  rec Change the advil to where you take it only as needed for pain.  Take delsym two tsp every 12 hours and supplement if needed with  tramadol 50 mg up to 1 every 4 hours to suppress the urge to cough. Swallowing water or using ice chips/non mint and menthol containing candies (such as lifesavers or sugarless jolly ranchers) are also effective.  You should rest your voice and avoid activities that you know make you cough. Once you have eliminated  the cough for 3 straight days try reducing the tramadol first,  then the delsym as tolerated.  No work Aug 31st to 04/03/15     Thoracentesis 03/29/15 only 200 cc/ exudative, nl glucose, neg cyt  04/03/2015 f/u extended ov/Latessa Tillis re: CAP with R parapneumonic effusion  Chief Complaint  Patient presents with  . Follow-up    Pt c/o feeling weak today. She states that she fainted this am at home.  She states that she has also been having muscle weakness and arthritic like pain in her hands.    still taking advil 200 x 3 tid and felt great one day prior to OV  Then worse since am 04/03/2015  On 03/31/15 developed L sided pleuritic cp > augmentin started and L chest pain improved until woke up with worse pain on L same site am of ov Min cough, no chills, no fever, min sweats rec cta 04/03/15  No evidence of pulmonary embolus. Stable multifocal airspace opacities are noted, most prominently seen in the right middle lobe, consistent with pneumonia. Continued CT follow-up is recommended to ensure resolution and rule out underlying neoplasm. Mild bilateral posterior basilar subsegmental atelectasis is noted. Small bilateral pleural effusions are noted posteriorly.  Admit date: 04/03/2015 Discharge date: 04/10/2015  Discharge Diagnoses:  Active Hospital Problems   Diagnosis Date Noted  . Rheumatoid arthritis involving multiple joints 04/09/2015  . HCAP (healthcare-associated pneumonia)   . Paroxysmal atrial fibrillation   . Bacterial lobar pneumonia 03/21/2015  . Sepsis 03/18/2015  . Hyperlipidemia 11/14/2013    Resolved Hospital Problems   Diagnosis Date Noted Date Resolved  No resolved problems to display.             History of present illness:  64 year old female with past medical history of recent hospitalization for right middle lobe pneumonia complicated by right pleural effusion status post thoracentesis. Despite multiple courses of IV and by mouth  antibiotics, pneumonia has not resolved. Previous cultures and cytology negative for bacteria or malignancy. Patient readmitted on 9/19 for recurrent shortness of breath and worsening pneumonia. On admission, patient noted to be in rapid A. fib, however she converted to normal sinus even prior to starting [facility] Hospital Course:    HCAP (healthcare-associated pneumonia)/persistent bacterial lobar pneumonia: Pulmonary consulted. Started on broad-spectrum antibiotics. Underwent bronchoscopy with cultures and pathology negative. Felt to be initially fungal infection versus resistant bacteria, but now in the setting of joint flare up, felt more to be autoimmune. Patient completed full course of antibiotics prior to discharge.   Paroxysmal atrial fibrillation: Echocardiogram done previous admission one week prior noted normal atria and size. Given that patient is currently in sinus rhythm, she was monitored and stayed in normal sinus rhythm. Her chads 2 vasc score was only 2 and patient hesitant to start on any type of anticoagulation.   /Multiple joint pain/Rheumatoid arthritis involving multiple joints: 1-2 days prior to admission, patient start having issues with significant joint pain and swelling which was new involving her MCP, DIP,  PIP, wrist, bilateral shoulders and right hip. CRP and sedimentation rate elevated. Lab work checked consistent with rheumatoid arthritis flareup. Felt to be secondary to rheumatoid arthritis flareup. Patient's are not Solu-Medrol and had significant improvement area steroids change over to by mouth. She'll follow up with outpatient rheumatology and be discharged on prednisone 60 mg (1 mg/kg) and stay on that dose until follow-up.  leukocytosis: Secondary to steroids, no signs of acute infectious process  Procedures:  Bronchoscopy done 9/22: Cultures and samples negative  Consultations:  Pulmonary       04/23/2015  f/u ov/Waniya Hoglund re: R pna/ parapneumonic  effusion/ new dx RA on high dose pred now per [facility] Chief Complaint  Patient presents with  . Hospitalization Follow-up    Patient doing well.  patient has a lot of questions regarding RA involvement with lungs.  cough, nasal congestion..    Cough more productive since 04/19/15 clear mucus/ no fever / R cp p coughing fit 10/8 better p mobic rx  Not limited by breathing from desired activities  But by fatigue   No obvious day to day or daytime variability or assoc   chest tightness, subjective wheeze or overt sinus or hb symptoms. No unusual exp hx or h/o childhood pna/ asthma or knowledge of premature birth.  Sleeping ok without nocturnal  or early am exacerbation  of respiratory  c/o's or need for noct saba. Also denies any obvious fluctuation of symptoms with weather or environmental changes or other aggravating or alleviating factors except as outlined above   Current Medications, Allergies, Complete Past Medical History, Past Surgical History, Family History, and Social History were reviewed in [employer] record.  ROS  The following are not active complaints unless bolded sore throat, dysphagia, dental problems, itching, sneezing,  nasal congestion or excess/ purulent secretions, ear ache,   fever, chills, sweats, unintended wt loss,  exertional cp, hemoptysis,  orthopnea pnd or leg swelling, presyncope, palpitations, abdominal pain, anorexia, nausea, vomiting, diarrhea  or change in bowel or bladder habits, change in stools or urine, dysuria,hematuria,  rash, arthralgias better, visual complaints, headache, numbness, weakness or ataxia or problems with walking or coordination,  change in mood/affect or memory.             Objective:  Physical Exam   Amb talkative wf nad   04/03/2015       130  > 04/23/2015  128   Wt Readings from Last 3 Encounters:  03/22/15 133 lb 6.4 oz (60.51 kg)  03/18/15 133 lb 9.6 oz (60.6 kg)  04/12/14 135 lb (61.236 kg)    Vital  signs reviewed   HEENT: nl dentition, turbinates, and orophanx. Nl external ear canals without cough reflex   NECK :  without JVD/Nodes/TM/ nl carotid upstrokes bilaterally   LUNGS: no acc muscle use, good bs bilaterally, no rub    CV:  RRR  no s3 or murmur or increase in P2, no edema   ABD:  soft and nontender with nl excursion in the supine position. No bruits or organomegaly, bowel sounds nl  MS:  warm without deformities, no erythema  SKIN: warm and dry without lesions    NEURO:  alert, approp, no deficits- no pronator drift         I personally reviewed images and agree with radiology impression as follows:  CXR:  04/23/2015  COPD. Interval resolution of the small right pleural effusion. There is persistent atelectasis or infiltrate in the right middle lobe anteriorly.  Assessment & Plan:

## 2015-04-23 NOTE — Assessment & Plan Note (Signed)
Last worked 03/14/15 >  Off until May 24 2015  Present on cxr clearly on 03/27/2015  - CT chest with contrast 03/28/2015 > ? Empyema > 03/29/2015 [place] t centesis >>> exudative 200 ml seroud fluid with wbc 2200 L > N and 25 E, nl glucose and cytology neg  - Repeat CTa 04/03/2015 > no change No evidence of pulmonary embolus. Stable multifocal airspace opacities are noted, most prominently seen in the right middle lobe, consistent with pneumonia. Continued CT follow-up is recommended to ensure resolution and rule out underlying neoplasm. Mild bilateral posterior basilar subsegmental atelectasis is noted. Small bilateral pleural effusions are noted posteriorly. - CXR 04/23/2015 effusion resolved    Reviewed natural h/o of CAP/parapeumonic effusion and overlap with RA does not mean the RA had anything to do her pna or the effusion (with nl glucose this would be very unsual with RA but not uncomplicated parapneumonic process)  but may have been triggered for sure by the original viral / pneumonia process.  No further abx needed in any case, nor drainage procedures.   I had an extended discussion with the patient and friend Rosebud Poles reviewing all relevant studies completed to date and  lasting 15 to 20 minutes of a 25 minute visit    Each maintenance medication was reviewed in detail including most importantly the difference between maintenance and prns and under what circumstances the prns are to be triggered using an action plan format that is not reflected in the computer generated alphabetically organized AVS.    Please see instructions for details which were reviewed in writing and the patient given a copy highlighting the part that I personally wrote and discussed at today's ov.

## 2015-04-23 NOTE — Patient Instructions (Addendum)
While on prednisone I recommend you take prilosec Take 30- 60 min before your first and last meals of the day  Please remember to go to the x-ray department downstairs for your tests - we will call you with the results when they are available.  Please schedule a follow up office visit in 4 weeks, sooner if needed.

## 2015-04-23 NOTE — Assessment & Plan Note (Signed)
levaquin 9/7 -  03/25/15   Assoc with marked elevation of esr/ Pos RA serology > referred to Rheumatology/ Dr Hawkes   The good news is that this wasn't iatrogenic (as feared from use of levaquin) but related to RA with further taper of pred as planned per Dr Hawkes 

## 2015-04-25 ENCOUNTER — Telehealth: Payer: Self-pay | Admitting: Internal Medicine

## 2015-04-25 ENCOUNTER — Other Ambulatory Visit: Payer: Self-pay | Admitting: Gynecology

## 2015-04-25 ENCOUNTER — Telehealth: Payer: Self-pay

## 2015-04-25 DIAGNOSIS — M8580 Other specified disorders of bone density and structure, unspecified site: Secondary | ICD-10-CM

## 2015-04-25 NOTE — Telephone Encounter (Signed)
[name] informed. Order placed.

## 2015-04-25 NOTE — Telephone Encounter (Signed)
okay

## 2015-04-25 NOTE — Telephone Encounter (Signed)
[name] sent me a message: "Pt called scheduled her overdue ce nov 28 w/tf.   Stated she needs to have dexa done per her Rheumatologist  Dr Berna Bue, who is treating her?  (Last dexa here was feb 2014 per TF note from 10/20/12 says repeat dexa 2 yrs)   Pt req dexa soon! Ok to proceed w/dexa before ce? "

## 2015-04-25 NOTE — Telephone Encounter (Signed)
Attempted to call Ronny Bacon from [facility] Left voicemail to call office back

## 2015-04-26 NOTE — Telephone Encounter (Signed)
lmtcb X2 for [employer]

## 2015-04-27 NOTE — Telephone Encounter (Signed)
Patient called to check on the status of this. Patient is 5 weeks behind on short-term disability because paperwork has not been completed.  Please call her back, [phone].

## 2015-04-27 NOTE — Telephone Encounter (Signed)
Forms have been placed in MW look at. Will route to [place] to follow up on.

## 2015-04-27 NOTE — Telephone Encounter (Signed)
[employer] Disability.  They stated that they have been sending [place] paperwork to be completed, however, when I gave them our fax number, they realized they were faxing the paperwork to the incorrect fax number.  Papers being faxed for Dr. Melvyn Novas to complete.  Pt requesting that papers get done today because she has been waiting for 5 weeks for her short term disability.  Also, pt requesting note taking patient out until 11/7 faxed to her employer today: [employer]  Attn: Alyson Ingles - Fax: [phone]    To Magda Paganini for follow up

## 2015-04-27 NOTE — Telephone Encounter (Signed)
I have received disability paperwork form fax and placed them forms in MW folder up front to be passed out at end of today

## 2015-04-30 NOTE — Telephone Encounter (Signed)
Dr Melvyn Novas, do you remember completing these forms? They are no longer in your lookat

## 2015-04-30 NOTE — Telephone Encounter (Signed)
I believe so and made it so no work until next [employer]	[place]

## 2015-05-01 NOTE — Telephone Encounter (Signed)
I do not see them in the scan folder  I remember now that they were forwarded to [employer]  Will close encounter

## 2015-05-01 NOTE — Telephone Encounter (Signed)
[name] [name], were these faxed or in the scan folder?

## 2015-05-03 ENCOUNTER — Telehealth: Payer: Self-pay | Admitting: Internal Medicine

## 2015-05-03 ENCOUNTER — Encounter: Payer: Self-pay | Admitting: *Deleted

## 2015-05-03 DIAGNOSIS — M069 Rheumatoid arthritis, unspecified: Secondary | ICD-10-CM

## 2015-05-03 LAB — FUNGUS CULTURE W SMEAR
FUNGAL SMEAR: NONE SEEN
SPECIAL REQUESTS: NORMAL

## 2015-05-03 NOTE — Telephone Encounter (Signed)
Fine with me but I though they were in the same practice and the PA she wants to avoid may still end up seeing her- if all else fails she can always have her primary  Coordinate referrals which is how it's supposed to work anyway

## 2015-05-03 NOTE — Telephone Encounter (Signed)
Spoke with the pt and notified of recs per MW  Referral was made  I have also done another work note for her- out at least until next ov here on 05/21/15 and faxed this to attn: Alyson Ingles at [phone] Nothing further needed

## 2015-05-03 NOTE — Telephone Encounter (Signed)
Called and spoke with pt Pt stated the following   1. She spoke with Holland Falling this morning and was informed that they are still missing info on forms for short term disability to be complete and for pt to receive check. Holland Falling is going to refax form to          office for Dr Melvyn Novas to fill out again    2. She needs a note from Dr Melvyn Novas to her employer stating that she is suppose to be out of work until 05-22-15 per MW. Pt will come by the office to pick up once completed   3. Pt states that she has had two appt with Dr Trudie Reed and is very unhappy with the care she received. Pt stated that she never saw Dr Trudie Reed only PA and that the PA had no bed side manner        Pt is requesting a new referral to Dr Estanislado Pandy for arthritic pain   Dr Melvyn Novas, please advise of the above. Thanks

## 2015-05-07 ENCOUNTER — Encounter: Payer: Self-pay | Admitting: Adult Health

## 2015-05-08 ENCOUNTER — Encounter: Payer: Self-pay | Admitting: Gynecology

## 2015-05-08 ENCOUNTER — Telehealth: Payer: Self-pay | Admitting: Internal Medicine

## 2015-05-08 ENCOUNTER — Ambulatory Visit (INDEPENDENT_AMBULATORY_CARE_PROVIDER_SITE_OTHER): Payer: Managed Care, Other (non HMO)

## 2015-05-08 ENCOUNTER — Other Ambulatory Visit: Payer: Self-pay | Admitting: Gynecology

## 2015-05-08 DIAGNOSIS — M810 Age-related osteoporosis without current pathological fracture: Secondary | ICD-10-CM | POA: Diagnosis not present

## 2015-05-08 DIAGNOSIS — M8589 Other specified disorders of bone density and structure, multiple sites: Secondary | ICD-10-CM

## 2015-05-08 DIAGNOSIS — M8580 Other specified disorders of bone density and structure, unspecified site: Secondary | ICD-10-CM

## 2015-05-08 DIAGNOSIS — Z13820 Encounter for screening for osteoporosis: Secondary | ICD-10-CM

## 2015-05-08 NOTE — Telephone Encounter (Signed)
Will make MW aware.

## 2015-05-09 ENCOUNTER — Other Ambulatory Visit: Payer: Self-pay | Admitting: Gynecology

## 2015-05-09 DIAGNOSIS — M810 Age-related osteoporosis without current pathological fracture: Secondary | ICD-10-CM

## 2015-05-10 ENCOUNTER — Other Ambulatory Visit: Payer: Self-pay | Admitting: Adult Health

## 2015-05-14 ENCOUNTER — Telehealth: Payer: Self-pay | Admitting: Internal Medicine

## 2015-05-14 NOTE — Telephone Encounter (Signed)
Spoke with patient who is concerned that her disability is still not taken care of Pt stated per an earlier conversation with [employer], that the process is waiting on office notes and she is not sure where the disconnect is Advised pt will be happy to fax notes for her (pt verbally authorized the release of all ov notes w/ MW) to [facility]: Wanda Plump [phone] Claim # [id number] Attn Nelda Marseille [phone] Advised pt will call once receipt of notes is verified.  Pt happy with this solution and voiced her understanding

## 2015-05-15 NOTE — Telephone Encounter (Signed)
[name] [name], was this done yesterday? thanks

## 2015-05-15 NOTE — Telephone Encounter (Signed)
Sorry - yes, this was indeed done.  Have not yet been able to call [employer] to verify receipt.  Will do so 11/2

## 2015-05-16 ENCOUNTER — Ambulatory Visit (INDEPENDENT_AMBULATORY_CARE_PROVIDER_SITE_OTHER): Payer: Managed Care, Other (non HMO) | Admitting: Gynecology

## 2015-05-16 ENCOUNTER — Encounter: Payer: Self-pay | Admitting: Gynecology

## 2015-05-16 ENCOUNTER — Telehealth: Payer: Self-pay | Admitting: Internal Medicine

## 2015-05-16 VITALS — BP 116/74 | Ht 66.0 in | Wt 127.0 lb

## 2015-05-16 DIAGNOSIS — M810 Age-related osteoporosis without current pathological fracture: Secondary | ICD-10-CM

## 2015-05-16 DIAGNOSIS — Z01419 Encounter for gynecological examination (general) (routine) without abnormal findings: Secondary | ICD-10-CM

## 2015-05-16 DIAGNOSIS — N952 Postmenopausal atrophic vaginitis: Secondary | ICD-10-CM

## 2015-05-16 MED ORDER — ALENDRONATE SODIUM 70 MG PO TABS: 70.0000 mg | ORAL_TABLET | ORAL | Status: DC

## 2015-05-16 NOTE — Telephone Encounter (Signed)
Patient is calling back to check on disability.  May be reached at [phone]

## 2015-05-16 NOTE — Telephone Encounter (Signed)
Spoke with pt, states she called her insurance company this morning and they have not yet received the forms that ware faxed on Monday per 05/14/15 phone note.   Spoke with Janett Billow, states she had just gotten off the phone with [facility] at [facility] to verify fax # and had just re-faxed this paperwork.   Pt requests to be notified if paperwork has been received by [employer].    Will forward to Jess to follow up on.

## 2015-05-16 NOTE — Telephone Encounter (Signed)
Spoke with the pt  She states calling to verify that her OV notes were faxed to [facility]  I advised that this was done on 05/15/15  She states that she has to call [place] today and will ask about this  Nothing further needed

## 2015-05-16 NOTE — Patient Instructions (Signed)
Start on vitamin D 1000 units over-the-counter daily.  You may obtain a copy of any labs that were done today by logging onto MyChart as outlined in the instructions provided with your AVS (after visit summary). The office will not call with normal lab results but certainly if there are any significant abnormalities then we will contact you.   Health Maintenance Adopting a healthy lifestyle and getting preventive care can go a long way to promote health and wellness. Talk with your health care provider about what schedule of regular examinations is right for you. This is a good chance for you to check in with your provider about disease prevention and staying healthy. In between checkups, there are plenty of things you can do on your own. Experts have done a lot of research about which lifestyle changes and preventive measures are most likely to keep you healthy. Ask your health care provider for more information. WEIGHT AND DIET  Eat a healthy diet  Be sure to include plenty of vegetables, fruits, low-fat dairy products, and lean protein.  Do not eat a lot of foods high in solid fats, added sugars, or salt.  Get regular exercise. This is one of the most important things you can do for your health.  Most adults should exercise for at least 150 minutes each week. The exercise should increase your heart rate and make you sweat (moderate-intensity exercise).  Most adults should also do strengthening exercises at least twice a week. This is in addition to the moderate-intensity exercise.  Maintain a healthy weight  Body mass index (BMI) is a measurement that can be used to identify possible weight problems. It estimates body fat based on height and weight. Your health care provider can help determine your BMI and help you achieve or maintain a healthy weight.  For females 18 years of age and older:   A BMI below 18.5 is considered underweight.  A BMI of 18.5 to 24.9 is normal.  A BMI of 25  to 29.9 is considered overweight.  A BMI of 30 and above is considered obese.  Watch levels of cholesterol and blood lipids  You should start having your blood tested for lipids and cholesterol at 66 years of age, then have this test every 5 years.  You may need to have your cholesterol levels checked more often if:  Your lipid or cholesterol levels are high.  You are older than 66 years of age.  You are at high risk for heart disease.  CANCER SCREENING   Lung Cancer  Lung cancer screening is recommended for adults 69-46 years old who are at high risk for lung cancer because of a history of smoking.  A yearly low-dose CT scan of the lungs is recommended for people who:  Currently smoke.  Have quit within the past 15 years.  Have at least a 30-pack-year history of smoking. A pack year is smoking an average of one pack of cigarettes a day for 1 year.  Yearly screening should continue until it has been 15 years since you quit.  Yearly screening should stop if you develop a health problem that would prevent you from having lung cancer treatment.  Breast Cancer  Practice breast self-awareness. This means understanding how your breasts normally appear and feel.  It also means doing regular breast self-exams. Let your health care provider know about any changes, no matter how small.  If you are in your 20s or 30s, you should have a clinical breast  exam (CBE) by a health care provider every 1-3 years as part of a regular health exam.  If you are 61 or older, have a CBE every year. Also consider having a breast X-ray (mammogram) every year.  If you have a family history of breast cancer, talk to your health care provider about genetic screening.  If you are at high risk for breast cancer, talk to your health care provider about having an MRI and a mammogram every year.  Breast cancer gene (BRCA) assessment is recommended for women who have family members with BRCA-related  cancers. BRCA-related cancers include:  Breast.  Ovarian.  Tubal.  Peritoneal cancers.  Results of the assessment will determine the need for genetic counseling and BRCA1 and BRCA2 testing. Cervical Cancer Routine pelvic examinations to screen for cervical cancer are no longer recommended for nonpregnant women who are considered low risk for cancer of the pelvic organs (ovaries, uterus, and vagina) and who do not have symptoms. A pelvic examination may be necessary if you have symptoms including those associated with pelvic infections. Ask your health care provider if a screening pelvic exam is right for you.   The Pap test is the screening test for cervical cancer for women who are considered at risk.  If you had a hysterectomy for a problem that was not cancer or a condition that could lead to cancer, then you no longer need Pap tests.  If you are older than 65 years, and you have had normal Pap tests for the past 10 years, you no longer need to have Pap tests.  If you have had past treatment for cervical cancer or a condition that could lead to cancer, you need Pap tests and screening for cancer for at least 20 years after your treatment.  If you no longer get a Pap test, assess your risk factors if they change (such as having a new sexual partner). This can affect whether you should start being screened again.  Some women have medical problems that increase their chance of getting cervical cancer. If this is the case for you, your health care provider may recommend more frequent screening and Pap tests.  The human papillomavirus (HPV) test is another test that may be used for cervical cancer screening. The HPV test looks for the virus that can cause cell changes in the cervix. The cells collected during the Pap test can be tested for HPV.  The HPV test can be used to screen women 64 years of age and older. Getting tested for HPV can extend the interval between normal Pap tests from  three to five years.  An HPV test also should be used to screen women of any age who have unclear Pap test results.  After 66 years of age, women should have HPV testing as often as Pap tests.  Colorectal Cancer  This type of cancer can be detected and often prevented.  Routine colorectal cancer screening usually begins at 66 years of age and continues through 66 years of age.  Your health care provider may recommend screening at an earlier age if you have risk factors for colon cancer.  Your health care provider may also recommend using home test kits to check for hidden blood in the stool.  A small camera at the end of a tube can be used to examine your colon directly (sigmoidoscopy or colonoscopy). This is done to check for the earliest forms of colorectal cancer.  Routine screening usually begins at age 67.  Direct examination of the colon should be repeated every 5-10 years through 66 years of age. However, you may need to be screened more often if early forms of precancerous polyps or small growths are found. Skin Cancer  Check your skin from head to toe regularly.  Tell your health care provider about any new moles or changes in moles, especially if there is a change in a mole's shape or color.  Also tell your health care provider if you have a mole that is larger than the size of a pencil eraser.  Always use sunscreen. Apply sunscreen liberally and repeatedly throughout the day.  Protect yourself by wearing long sleeves, pants, a wide-brimmed hat, and sunglasses whenever you are outside. HEART DISEASE, DIABETES, AND HIGH BLOOD PRESSURE   Have your blood pressure checked at least every 1-2 years. High blood pressure causes heart disease and increases the risk of stroke.  If you are between 55 years and 79 years old, ask your health care provider if you should take aspirin to prevent strokes.  Have regular diabetes screenings. This involves taking a blood sample to check  your fasting blood sugar level.  If you are at a normal weight and have a low risk for diabetes, have this test once every three years after 66 years of age.  If you are overweight and have a high risk for diabetes, consider being tested at a younger age or more often. PREVENTING INFECTION  Hepatitis B  If you have a higher risk for hepatitis B, you should be screened for this virus. You are considered at high risk for hepatitis B if:  You were born in a country where hepatitis B is common. Ask your health care provider which countries are considered high risk.  Your parents were born in a high-risk country, and you have not been immunized against hepatitis B (hepatitis B vaccine).  You have HIV or AIDS.  You use needles to inject street drugs.  You live with someone who has hepatitis B.  You have had sex with someone who has hepatitis B.  You get hemodialysis treatment.  You take certain medicines for conditions, including cancer, organ transplantation, and autoimmune conditions. Hepatitis C  Blood testing is recommended for:  Everyone born from 1945 through 1965.  Anyone with known risk factors for hepatitis C. Sexually transmitted infections (STIs)  You should be screened for sexually transmitted infections (STIs) including gonorrhea and chlamydia if:  You are sexually active and are younger than 66 years of age.  You are older than 66 years of age and your health care provider tells you that you are at risk for this type of infection.  Your sexual activity has changed since you were last screened and you are at an increased risk for chlamydia or gonorrhea. Ask your health care provider if you are at risk.  If you do not have HIV, but are at risk, it may be recommended that you take a prescription medicine daily to prevent HIV infection. This is called pre-exposure prophylaxis (PrEP). You are considered at risk if:  You are sexually active and do not regularly use  condoms or know the HIV status of your partner(s).  You take drugs by injection.  You are sexually active with a partner who has HIV. Talk with your health care provider about whether you are at high risk of being infected with HIV. If you choose to begin PrEP, you should first be tested for HIV. You should then be   tested every 3 months for as long as you are taking PrEP.  PREGNANCY   If you are premenopausal and you may become pregnant, ask your health care provider about preconception counseling.  If you may become pregnant, take 400 to 800 micrograms (mcg) of folic acid every day.  If you want to prevent pregnancy, talk to your health care provider about birth control (contraception). OSTEOPOROSIS AND MENOPAUSE   Osteoporosis is a disease in which the bones lose minerals and strength with aging. This can result in serious bone fractures. Your risk for osteoporosis can be identified using a bone density scan.  If you are 23 years of age or older, or if you are at risk for osteoporosis and fractures, ask your health care provider if you should be screened.  Ask your health care provider whether you should take a calcium or vitamin D supplement to lower your risk for osteoporosis.  Menopause may have certain physical symptoms and risks.  Hormone replacement therapy may reduce some of these symptoms and risks. Talk to your health care provider about whether hormone replacement therapy is right for you.  HOME CARE INSTRUCTIONS   Schedule regular health, dental, and eye exams.  Stay current with your immunizations.   Do not use any tobacco products including cigarettes, chewing tobacco, or electronic cigarettes.  If you are pregnant, do not drink alcohol.  If you are breastfeeding, limit how much and how often you drink alcohol.  Limit alcohol intake to no more than 1 drink per day for nonpregnant women. One drink equals 12 ounces of beer, 5 ounces of wine, or 1 ounces of hard  liquor.  Do not use street drugs.  Do not share needles.  Ask your health care provider for help if you need support or information about quitting drugs.  Tell your health care provider if you often feel depressed.  Tell your health care provider if you have ever been abused or do not feel safe at home. Document Released: 01/13/2011 Document Revised: 11/14/2013 Document Reviewed: 06/01/2013 [facility] Patient Information 2015 [place], [place]. This information is not intended to replace advice given to you by your health care provider. Make sure you discuss any questions you have with your health care provider.

## 2015-05-16 NOTE — Progress Notes (Signed)
[name] [name] 1951/04/23 [id number]        64 y.o.  G2P2002  Patient's last menstrual period was 07/14/1993. for annual exam.  Several issues noted below.  Past medical history,surgical history, problem list, medications, allergies, family history and social history were all reviewed and documented as reviewed in the EPIC chart.  ROS:  Performed with pertinent positives and negatives included in the history, assessment and plan.   Additional significant findings :  none   Exam: [name] Vitals:   05/16/15 1043  BP: 116/74  Height: 5\' 6"  (1.676 m)  Weight: 127 lb (57.607 kg)   General appearance:  Normal affect, orientation and appearance. Skin: Grossly normal HEENT: Without gross lesions.  No cervical or supraclavicular adenopathy. Thyroid normal.  Lungs:  Clear without wheezing, rales or rhonchi Cardiac: RR, without RMG Abdominal:  Soft, nontender, without masses, guarding, rebound, organomegaly or hernia Breasts:  Examined lying and sitting without masses, retractions, discharge or axillary adenopathy. Pelvic:  Ext/BUS/vagina with atrophic changes  Cervix with atrophic changes  Uterus anteverted, normal size, shape and contour, midline and mobile nontender   Adnexa  Without masses or tenderness    Anus and perineum  Normal   Rectovaginal  Normal sphincter tone without palpated masses or tenderness.    Assessment/Plan:  64 y.o. G91P2002 female for annual exam.   1. Postmenopausal/atrophic genital changes. Without significant hot flushes, night sweats, vaginal dryness or any vaginal bleeding. Continue to monitor and report any vaginal bleeding. 2. Osteoporosis.  Had a fall leading to a hip fracture leading to diagnosis of osteoporosis. DEXA 2016 T score -2.4 left forearm. Overall stable in her hip and spine. Started on Fosamax last year and is doing well on this. Refill 1 year provided. We'll tentatively plan on 45 intercourse with drug-free holiday to follow. Repeat bone  density in 2 years.  Recommended supplementing vitamin D with 1000 units daily OTC. Have vitamin D level checked next time she has blood walk drawn. 3. Pap smear/HPV 2015 negative. No Pap smear done today. No history of significant abnormal Pap smears previously. 4. Colonoscopy 7-8 years ago with tentative repeat recommended at 10 years. 5. Mammography 11/2014. Continue with annual mammography when due. Is being also reviewed. 6. Health maintenance. No routine blood work done as this is done at her primary physician's office. Follow up 1 year, sooner as needed.   [name] MD, 11:31 AM 05/16/2015

## 2015-05-16 NOTE — Telephone Encounter (Signed)
[employer] again and spoke with representative Colletta [place] who informed me that it typically takes 24hrs for a fax to upload and show up on a patient's file - nothing showing as of yet.

## 2015-05-17 NOTE — Telephone Encounter (Signed)
Someone has already removed this from her history.

## 2015-05-17 NOTE — Telephone Encounter (Signed)
Called and spoke to Journey at [facility] to verify that [place] received faxed paperwork. She stated that the ov notes were received on 05/16/15 and a decision was made on 05/16/15 after 5pm. The pt was approved for disability. She stated the pt had been notified and nothing further is needed.   Called and spoke with the patient. She stated that [employer] did call her and inform her that her disability was approved. She stated she given a copy of her hospital consult note done by BQ on 04/04/15 from a "doctor friend". She explained after reviewing it she noticed that in her past history it showed she had atrophic vaginitis, which she states she never had. She had a gynecologist ov on 05/16/15 and discussed this with him. Her gynecologist agreed with her. She says she is getting asked about it at different [facility] practices and feels embarrassed, especially since she has never had this issue. I explained to her that it is not on her problem list and only seen in the past medical history on this note. She asked that BQ addendum this note and remove it from her past history.    Hosptial Consult Note on 04/04/15 PAST MEDICAL HISTORY :   has a past medical history of Atrophic vaginitis; Elevated cholesterol; Osteoporosis (2015); Pneumonia (03/2015); and Pleural effusion (9/206).  has past surgical history that includes Finger surgery and Hip Arthroplasty (Right, 11/14/2013).  BQ please advise.

## 2015-05-17 NOTE — Telephone Encounter (Signed)
Spoke with BQ. Pt saw her gynecologists yesterday and they clearly document postmenopausal  Atrophic vaginitis. This was also added to her chart back in 2012 by her gynecologists. She will need to discuss this with them as this pulls from her PMH.   Called spoke with pt. Made her aware of above. She reports she is just going to drop this bc it isn't a big deal anymore. Nothing further needed

## 2015-05-18 LAB — AFB CULTURE WITH SMEAR (NOT AT ARMC)
ACID FAST SMEAR: NONE SEEN
SPECIAL REQUESTS: NORMAL

## 2015-05-21 ENCOUNTER — Telehealth: Payer: Self-pay | Admitting: Internal Medicine

## 2015-05-21 ENCOUNTER — Ambulatory Visit (INDEPENDENT_AMBULATORY_CARE_PROVIDER_SITE_OTHER): Payer: Managed Care, Other (non HMO) | Admitting: Internal Medicine

## 2015-05-21 ENCOUNTER — Ambulatory Visit (INDEPENDENT_AMBULATORY_CARE_PROVIDER_SITE_OTHER)
Admission: RE | Admit: 2015-05-21 | Discharge: 2015-05-21 | Disposition: A | Discharge status: Home / Self Care | Payer: Managed Care, Other (non HMO) | Source: Ambulatory Visit | Attending: Internal Medicine | Admitting: Internal Medicine

## 2015-05-21 ENCOUNTER — Encounter: Payer: Self-pay | Admitting: Internal Medicine

## 2015-05-21 ENCOUNTER — Encounter: Payer: Self-pay | Admitting: *Deleted

## 2015-05-21 VITALS — BP 144/70 | HR 119 | Ht 66.5 in | Wt 130.2 lb

## 2015-05-21 DIAGNOSIS — J948 Other specified pleural conditions: Secondary | ICD-10-CM | POA: Diagnosis not present

## 2015-05-21 DIAGNOSIS — J918 Pleural effusion in other conditions classified elsewhere: Principal | ICD-10-CM

## 2015-05-21 DIAGNOSIS — J189 Pneumonia, unspecified organism: Secondary | ICD-10-CM

## 2015-05-21 DIAGNOSIS — M069 Rheumatoid arthritis, unspecified: Secondary | ICD-10-CM

## 2015-05-21 MED ORDER — PREDNISONE 10 MG PO TABS: ORAL_TABLET | ORAL | Status: DC

## 2015-05-21 NOTE — Telephone Encounter (Signed)
Added Flu shot to patient's immunization history. Advised patient via voicemail that I added immunization to her record and if she has any further questions, she can call back. Nothing further needed. Closing encounter

## 2015-05-21 NOTE — Telephone Encounter (Signed)
Pt was seen in clinic today  The following work excuse letter was faxed to [place] at [phone] claim # [id number] and also to [place] attn Alyson Ingles [phone] per pt request  May 21, 2015   Patient: [name] [name]  Date of Birth: Oct 09, 1950  Date of Visit: 05/21/2015    To Whom It May Concern:  It is my medical opinion that [name] [name] should remain out of work from 03/13/15 until her next appointment with Rheumatology which is still pending.  If you have any questions or concerns, please don't hesitate to call.  Sincerely,     Christinia Gully, MD

## 2015-05-21 NOTE — Progress Notes (Signed)
Subjective:    Patient ID: [name] [name], female    DOB: Feb 02, 1951,    MRN: [id number]    Brief patient profile:  26 yowf never smoker works at [employer] able hike at [employer] abruptly ill rx zpak Tmax 101.9  then 6 days later admitted:   Admit date: 03/18/2015 Discharge date: 03/21/2015  Recommendations for Outpatient Follow-up:  #1 Discharge home with outpatient PCP follow-up in 1 week #2 Patient with complete total seven-day course of Levaquin on 9/11. Patient will need a follow-up chest x-ray in 4 weeks to ensure resolution.  Discharge Diagnoses:  Principal problem Sepsis secondary to bacterial lobar pneumonia  Active Problems:  Hyperlipidemia  Orthostatic syncope   Discharge Condition:Fair  Diet recommendation: Regular  Filed Weights   03/18/15 2108  Weight: 60.6 kg (133 lb 9.6 oz)    History of present illness:  Please refer to admission H&P for details, in brief, 64 year old female presented to the ED with one week history of fever with nonproductive cough and pain over her shoulder blades. On 8/31 patient was diagnosed with cognitive by pneumonia and treated with a course of Z-Pak .however patient continued to have low-grade fever and poor appetite. On the day of admission she got up to go to the bathroom, felt hot and shortly thereafter had a syncopal episode. Patient also complaining of dull pain in her shoulder blade. In the ED patient was found to have a fever of 100.32F with WBC of 16.6 and chest x-ray showing right lower lobe pneumonia . Patient met criteria for sepsis and admitted to medical floor.   Hospital Course:  Sepsis secondary to multifocal bacterial pneumonia Patient started on empiric vancomycin and Zosyn given sepsis and possible failed outpatient antibiotic therapy. She remains afebrile for the past 24 hours . Sepsis has resolved.. Cough and back pain improving. Still has leukocytosis. Patient clinically improved.Blood cultures,  urine for strep antigen and Legionella negative. CT angiogram of the chest on admission negative for PE. Patient will be discharged on oral Levaquin to complete a total 7 day course of antibiotics. Antitussives prescribed.  Upper back pain Appears to be secondary to pleurisy. Recommend to continue Tylenol when necessary  Orthostatic syncope.  Improved with IV fluids. A carotid Dopplers unremarkable.  Diarrhea Possibly associated with infection class antibiotics. C. difficile negative. resolved.  Protein calorie malnutrition Habits supplement  Hyperlipidemia Continue statin       03/22/2015 new pt consultation/ [name] [name]   Chief Complaint  Patient presents with  . Pulmonary Consult    Referred by Dr. Zada Girt.  Pt states that she was dxed with PNA 03/14/15. She c/o low grade fever and cough-non prod.     tmax 101.9 was the day after Zmax started  Cough better >  Scant yellow mucus started  New R cp started 03/21/15 / has not tried nsaids Mild nausea since 9/4 poor appetite, no vomiting rec  Try advil 200 mg three with meals as needed for fever or pain (tylenol can be used to supplement if needed)  Return first of the week for follow up cxr      03/27/2015 f/u ov/[name] [name] re: cap with R pleuritic cp  Chief Complaint  Patient presents with  . Follow-up    CXR done today. Pt states cough is unchanged "but does not hurt" - still non prod.   much better w/in 24 h of starting advil with no more fever, no more R pleuritic cp, still dry hacking cough  rec Change the advil to where you take it only as needed for pain.  Take delsym two tsp every 12 hours and supplement if needed with  tramadol 50 mg up to 1 every 4 hours to suppress the urge to cough. Swallowing water or using ice chips/non mint and menthol containing candies (such as lifesavers or sugarless jolly ranchers) are also effective.  You should rest your voice and avoid activities that you know make you cough. Once you have eliminated  the cough for 3 straight days try reducing the tramadol first,  then the delsym as tolerated.  No work Aug 31st to 04/03/15     Thoracentesis 03/29/15 only 200 cc/ exudative, nl glucose, neg cyt  04/03/2015 f/u extended ov/[name] [name] re: CAP with R parapneumonic effusion  Chief Complaint  Patient presents with  . Follow-up    Pt c/o feeling weak today. She states that she fainted this am at home.  She states that she has also been having muscle weakness and arthritic like pain in her hands.    still taking advil 200 x 3 tid and felt great one day prior to OV  Then worse since am 04/03/2015  On 03/31/15 developed L sided pleuritic cp > augmentin started and L chest pain improved until woke up with worse pain on L same site am of ov Min cough, no chills, no fever, min sweats rec cta 04/03/15  No evidence of pulmonary embolus. Stable multifocal airspace opacities are noted, most prominently seen in the right middle lobe, consistent with pneumonia. Continued CT follow-up is recommended to ensure resolution and rule out underlying neoplasm. Mild bilateral posterior basilar subsegmental atelectasis is noted. Small bilateral pleural effusions are noted posteriorly.  Admit date: 04/03/2015 Discharge date: 04/10/2015  Discharge Diagnoses:  Active Hospital Problems   Diagnosis Date Noted  . Rheumatoid arthritis involving multiple joints 04/09/2015  . HCAP (healthcare-associated pneumonia)   . Paroxysmal atrial fibrillation   . Bacterial lobar pneumonia 03/21/2015  . Sepsis 03/18/2015  . Hyperlipidemia 11/14/2013    Resolved Hospital Problems   Diagnosis Date Noted Date Resolved  No resolved problems to display.             History of present illness:  64 year old female with past medical history of recent hospitalization for right middle lobe pneumonia complicated by right pleural effusion status post thoracentesis. Despite multiple courses of IV and by mouth  antibiotics, pneumonia has not resolved. Previous cultures and cytology negative for bacteria or malignancy. Patient readmitted on 9/19 for recurrent shortness of breath and worsening pneumonia. On admission, patient noted to be in rapid A. fib, however she converted to normal sinus even prior to starting [facility] Course:    HCAP (healthcare-associated pneumonia)/persistent bacterial lobar pneumonia: Pulmonary consulted. Started on broad-spectrum antibiotics. Underwent bronchoscopy with cultures and pathology negative. Felt to be initially fungal infection versus resistant bacteria, but now in the setting of joint flare up, felt more to be autoimmune. Patient completed full course of antibiotics prior to discharge.   Paroxysmal atrial fibrillation: Echocardiogram done previous admission one week prior noted normal atria and size. Given that patient is currently in sinus rhythm, she was monitored and stayed in normal sinus rhythm. Her chads 2 vasc score was only 2 and patient hesitant to start on any type of anticoagulation.   /Multiple joint pain/Rheumatoid arthritis involving multiple joints: 1-2 days prior to admission, patient start having issues with significant joint pain and swelling which was new involving her MCP, DIP,  PIP, wrist, bilateral shoulders and right hip. CRP and sedimentation rate elevated. Lab work checked consistent with rheumatoid arthritis flareup. Felt to be secondary to rheumatoid arthritis flareup. Patient's are not Solu-Medrol and had significant improvement area steroids change over to by mouth. She'll follow up with outpatient rheumatology and be discharged on prednisone 60 mg (1 mg/kg) and stay on that dose until follow-up.  leukocytosis: Secondary to steroids, no signs of acute infectious process  Procedures:  Bronchoscopy done 9/22: Cultures and samples negative  Consultations:  Pulmonary       04/23/2015  f/u ov/[name] [name] re: R pna/ parapneumonic  effusion/ new dx RA on high dose pred now per [facility] Chief Complaint  Patient presents with  . Hospitalization Follow-up    Patient doing well.  patient has a lot of questions regarding RA involvement with lungs.  cough, nasal congestion..  Cough more productive since 04/19/15 clear mucus/ no fever / R cp p coughing fit 10/8 better p mobic rx rec While on prednisone I recommend you take prilosec Take 30- 60 min before your first and last meals of the day Rheum f/u planned     05/21/2015  f/u ov/[name] [name] re:  ? RA lung/ effusion  Vs late parapneumonic still on pred 20 mg daily  Chief Complaint  Patient presents with  . Follow-up    Pt states she has continued to have a cough. She states that when she takes a deep breath, she feels as if she is wearing a coresett. Her cough is mainly non prod, but occ produces some clear sputum.    continues to have sensation of tight in chest only with deep breathing = bilaterally like a tight bra @ 20 mg daily avg mobic 3 total pills x 6 weeks helps the hands, not sure it helps the breathing   Not limited by breathing from desired activities  But by fatigue  No obvious day to day or daytime variability or assoc   chest tightness, subjective wheeze or overt sinus or hb symptoms. No unusual exp hx or h/o childhood pna/ asthma or knowledge of premature birth.  Sleeping ok without nocturnal  or early am exacerbation  of respiratory  c/o's or need for noct saba. Also denies any obvious fluctuation of symptoms with weather or environmental changes or other aggravating or alleviating factors except as outlined above   Current Medications, Allergies, Complete Past Medical History, Past Surgical History, Family History, and Social History were reviewed in [employer] record.  ROS  The following are not active complaints unless bolded sore throat, dysphagia, dental problems, itching, sneezing,  nasal congestion or excess/ purulent secretions, ear  ache,   fever, chills, sweats, unintended wt loss,  exertional cp, hemoptysis,  orthopnea pnd or leg swelling, presyncope, palpitations, abdominal pain, anorexia, nausea, vomiting, diarrhea  or change in bowel or bladder habits, change in stools or urine, dysuria,hematuria,  rash, arthralgias esp hands, visual complaints, headache, numbness, weakness or ataxia or problems with walking or coordination,  change in mood/affect or memory.             Objective:  Physical Exam   Amb talkative wf nad - very evasive with specific answers to any questions esp related to symptom issues  04/03/2015       130  > 04/23/2015  128  >  05/21/2015  130   Wt Readings from Last 3 Encounters:  03/22/15 133 lb 6.4 oz (60.51 kg)  03/18/15 133 lb 9.6 oz (60.6  kg)  04/12/14 135 lb (61.236 kg)    Vital signs reviewed   HEENT: nl dentition, turbinates, and orophanx. Nl external ear canals without cough reflex   NECK :  without JVD/Nodes/TM/ nl carotid upstrokes bilaterally   LUNGS: no acc muscle use, good bs bilaterally, no rubs   CV:  RRR  no s3 or murmur or increase in P2, no edema   ABD:  soft and nontender with nl excursion in the supine position. No bruits or organomegaly, bowel sounds nl  MS:  warm without deformities, no erythema  SKIN: warm and dry without lesions    NEURO:  alert, approp, no deficits         I personally reviewed images and agree with radiology impression as follows:  CXR:  04/23/2015   Interval resolution of the small right pleural effusion. There is persistent atelectasis or infiltrate in the right middle lobe anteriorly.            Assessment & Plan:

## 2015-05-21 NOTE — Progress Notes (Signed)
Quick Note:  Spoke with pt and notified of results per Dr. Wert. Pt verbalized understanding and denied any questions.  ______ 

## 2015-05-21 NOTE — Patient Instructions (Signed)
Ok to use mobic 15 mg up to daily but take with meals   Taper prednisone 15 mg daily starting 11/10 then 10 mg daily x 06/07/15 and leave it there until you see Deveshwar   Please remember to go to the x-ray department downstairs for your tests - we will call you with the results when they are available.  You cannot work due to your hands being stiff from March 13 2015 > until Dr Bronson Curb visit   Please schedule a follow up office visit in 6 weeks, call sooner if needed

## 2015-05-22 ENCOUNTER — Encounter: Payer: Self-pay | Admitting: Internal Medicine

## 2015-05-25 ENCOUNTER — Other Ambulatory Visit (INDEPENDENT_AMBULATORY_CARE_PROVIDER_SITE_OTHER): Payer: Managed Care, Other (non HMO)

## 2015-05-25 DIAGNOSIS — I10 Essential (primary) hypertension: Secondary | ICD-10-CM | POA: Diagnosis not present

## 2015-05-25 DIAGNOSIS — E785 Hyperlipidemia, unspecified: Secondary | ICD-10-CM

## 2015-05-25 DIAGNOSIS — E039 Hypothyroidism, unspecified: Secondary | ICD-10-CM

## 2015-05-25 DIAGNOSIS — R300 Dysuria: Secondary | ICD-10-CM

## 2015-05-25 DIAGNOSIS — D649 Anemia, unspecified: Secondary | ICD-10-CM

## 2015-05-25 LAB — HEPATIC FUNCTION PANEL
ALBUMIN: 3.8 g/dL (ref 3.5–5.2)
ALK PHOS: 72 U/L (ref 39–117)
ALT: 7 U/L (ref 0–35)
AST: 12 U/L (ref 0–37)
Bilirubin, Direct: 0.1 mg/dL (ref 0.0–0.3)
TOTAL PROTEIN: 6.6 g/dL (ref 6.0–8.3)
Total Bilirubin: 0.4 mg/dL (ref 0.2–1.2)

## 2015-05-25 LAB — BASIC METABOLIC PANEL
BUN: 13 mg/dL (ref 6–23)
CALCIUM: 9.4 mg/dL (ref 8.4–10.5)
CHLORIDE: 101 meq/L (ref 96–112)
CO2: 32 meq/L (ref 19–32)
Creatinine, Ser: 0.74 mg/dL (ref 0.40–1.20)
GFR: 83.48 mL/min (ref >60.00)
GLUCOSE: 87 mg/dL (ref 70–99)
Potassium: 3.5 mEq/L (ref 3.5–5.1)
SODIUM: 142 meq/L (ref 135–145)

## 2015-05-25 LAB — POCT URINALYSIS DIPSTICK
Bilirubin, UA: NEGATIVE
GLUCOSE UA: NEGATIVE
Ketones, UA: NEGATIVE
NITRITE UA: NEGATIVE
PH UA: 5.5
PROTEIN UA: NEGATIVE
RBC UA: NEGATIVE
SPEC GRAV UA: 1.02
UROBILINOGEN UA: 0.2

## 2015-05-25 LAB — CBC WITH DIFFERENTIAL/PLATELET
BASOS PCT: 0.5 % (ref 0.0–3.0)
Basophils Absolute: 0.1 10*3/uL (ref 0.0–0.1)
EOS PCT: 1.1 % (ref 0.0–5.0)
Eosinophils Absolute: 0.2 10*3/uL (ref 0.0–0.7)
HEMATOCRIT: 39.2 % (ref 36.0–46.0)
Hemoglobin: 12.7 g/dL (ref 12.0–15.0)
LYMPHS ABS: 3.6 10*3/uL (ref 0.7–4.0)
LYMPHS PCT: 26.7 % (ref 12.0–46.0)
MCHC: 32.5 g/dL (ref 30.0–36.0)
MCV: 92.3 fl (ref 78.0–100.0)
MONOS PCT: 7.2 % (ref 3.0–12.0)
Monocytes Absolute: 1 10*3/uL (ref 0.1–1.0)
NEUTROS ABS: 8.7 10*3/uL — AB (ref 1.4–7.7)
NEUTROS PCT: 64.5 % (ref 43.0–77.0)
PLATELETS: 400 10*3/uL (ref 150.0–400.0)
RBC: 4.24 Mil/uL (ref 3.87–5.11)
RDW: 14.8 % (ref 11.5–15.5)
WBC: 13.4 10*3/uL — ABNORMAL HIGH (ref 4.0–10.5)

## 2015-05-25 LAB — LIPID PANEL
CHOLESTEROL: 313 mg/dL — AB (ref 0–200)
HDL: 92.1 mg/dL (ref >39.00)
LDL CALC: 200 mg/dL — AB (ref 0–99)
NonHDL: 220.8
Total CHOL/HDL Ratio: 3
Triglycerides: 103 mg/dL (ref 0.0–149.0)
VLDL: 20.6 mg/dL (ref 0.0–40.0)

## 2015-05-25 LAB — TSH: TSH: 1.57 u[IU]/mL (ref 0.35–4.50)

## 2015-05-27 ENCOUNTER — Encounter: Payer: Self-pay | Admitting: Internal Medicine

## 2015-05-27 NOTE — Assessment & Plan Note (Signed)
Last worked 03/14/15 >  Off until May 24 2015  Present on cxr clearly on 03/27/2015  - CT chest with contrast 03/28/2015 > ? Empyema > 03/29/2015 [place] t centesis >>> exudative 200 ml seroud fluid with wbc 2200 L > N and 25 E, nl glucose and cytology neg  - Repeat CTa 04/03/2015 > no change No evidence of pulmonary embolus. Stable multifocal airspace opacities are noted, most prominently seen in the right middle lobe, consistent with pneumonia. Continued CT follow-up is recommended to ensure resolution and rule out underlying neoplasm. Mild bilateral posterior basilar subsegmental atelectasis is noted. Small bilateral pleural effusions are noted posteriorly. - CXR 04/23/2015 effusion resolved   No further x-rays are recommended for this problem. She will continue to have mild tightness with deep breathing indefinitely because she has pleural scarring and I don't  believe she needs anything but prn  Mobic for this and of course addressing systemic inflammation per rheumatology. I still cannot convince myself that this effusion had anything to do with rheumatoid arthritis based on its characteristic of being painful (which is atypical for RA effusions) and with a normal glucose on sampling. Whether or not it was parapneumonic versus rheumatoid is a moot issue at this point because she is much better and no pulmonary f/u is needed at this point

## 2015-05-27 NOTE — Assessment & Plan Note (Addendum)
The goal with a chronic steroid dependent illness is always arriving at the lowest effective dose that controls the disease/symptoms and not accepting a set "formula" which is based on statistics or guidelines that don't always take into account patient  variability or the natural hx of the dz in every individual patient, which may well vary over time.  For now therefore I recommend the patient try taper the prednisone from 20 mg down to 10 mg daily pending f/u with rheum and be more aggressive with mobic up to 15 mg daily taken with meals prn breakthru pain or stiffness   She tells me she cannot use her hands because of the stiffness related to the arthritis and therefore will need to be kept out of work until follow-up with rheumatology and then further work absences need to be approved by rheumatology after that point

## 2015-06-11 ENCOUNTER — Encounter: Payer: No Typology Code available for payment source | Admitting: Gynecology

## 2015-06-11 ENCOUNTER — Encounter: Payer: Medicare Other | Admitting: Adult Health

## 2015-07-02 ENCOUNTER — Ambulatory Visit (INDEPENDENT_AMBULATORY_CARE_PROVIDER_SITE_OTHER): Payer: Managed Care, Other (non HMO) | Admitting: Internal Medicine

## 2015-07-02 ENCOUNTER — Telehealth: Payer: Self-pay | Admitting: Internal Medicine

## 2015-07-02 ENCOUNTER — Encounter: Payer: Self-pay | Admitting: Internal Medicine

## 2015-07-02 ENCOUNTER — Ambulatory Visit (INDEPENDENT_AMBULATORY_CARE_PROVIDER_SITE_OTHER)
Admission: RE | Admit: 2015-07-02 | Discharge: 2015-07-02 | Disposition: A | Discharge status: Home / Self Care | Payer: Managed Care, Other (non HMO) | Source: Ambulatory Visit | Attending: Internal Medicine | Admitting: Internal Medicine

## 2015-07-02 VITALS — BP 110/64 | HR 86 | Ht 66.0 in | Wt 133.0 lb

## 2015-07-02 DIAGNOSIS — J918 Pleural effusion in other conditions classified elsewhere: Principal | ICD-10-CM

## 2015-07-02 DIAGNOSIS — J189 Pneumonia, unspecified organism: Secondary | ICD-10-CM

## 2015-07-02 DIAGNOSIS — M069 Rheumatoid arthritis, unspecified: Secondary | ICD-10-CM | POA: Diagnosis not present

## 2015-07-02 DIAGNOSIS — J948 Other specified pleural conditions: Secondary | ICD-10-CM

## 2015-07-02 NOTE — Patient Instructions (Signed)
Please remember to go to the x-ray department downstairs for your tests - we will call you with the results when they are available.  Please schedule a follow up visit in 6 months with cxr on return but call sooner if needed for worse pain with breathing in or less exercise tolerance due to breathing difficulty

## 2015-07-02 NOTE — Progress Notes (Signed)
Subjective:   Patient ID: [name] [name], female    DOB: [birth year]    MRN: [id number]    Brief patient profile:  4   yowf never smoker works at [employer] able hike at [employer] abruptly ill rx zpak Tmax 101.9  then 6 days later admitted:   Admit date: 03/18/2015 Discharge date: 03/21/2015  Recommendations for Outpatient Follow-up:  #1 Discharge home with outpatient PCP follow-up in 1 week #2 Patient with complete total seven-day course of Levaquin on 9/11. Patient will need a follow-up chest x-ray in 4 weeks to ensure resolution.  Discharge Diagnoses:  Principal problem Sepsis secondary to bacterial lobar pneumonia  Active Problems:  Hyperlipidemia  Orthostatic syncope   Discharge Condition:Fair  Diet recommendation: Regular  Filed Weights   03/18/15 2108  Weight: 60.6 kg (133 lb 9.6 oz)    History of present illness:  Please refer to admission H&P for details, in brief, 64 year old female presented to the ED with one week history of fever with nonproductive cough and pain over her shoulder blades. On 8/31 patient was diagnosed with cognitive by pneumonia and treated with a course of Z-Pak .however patient continued to have low-grade fever and poor appetite. On the day of admission she got up to go to the bathroom, felt hot and shortly thereafter had a syncopal episode. Patient also complaining of dull pain in her shoulder blade. In the ED patient was found to have a fever of 100.16F with WBC of 16.6 and chest x-ray showing right lower lobe pneumonia . Patient met criteria for sepsis and admitted to medical floor.   Hospital Course:  Sepsis secondary to multifocal bacterial pneumonia Patient started on empiric vancomycin and Zosyn given sepsis and possible failed outpatient antibiotic therapy. She remains afebrile for the past 24 hours . Sepsis has resolved.. Cough and back pain improving. Still has leukocytosis. Patient clinically improved.Blood cultures,  urine for strep antigen and Legionella negative. CT angiogram of the chest on admission negative for PE. Patient will be discharged on oral Levaquin to complete a total 7 day course of antibiotics. Antitussives prescribed.  Upper back pain Appears to be secondary to pleurisy. Recommend to continue Tylenol when necessary  Orthostatic syncope.  Improved with IV fluids. A carotid Dopplers unremarkable.  Diarrhea Possibly associated with infection class antibiotics. C. difficile negative. resolved.  Protein calorie malnutrition Habits supplement  Hyperlipidemia Continue statin       03/22/2015 new pt consultation/ [name] [name]   Chief Complaint  Patient presents with  . Pulmonary Consult    Referred by Dr. Zada Girt.  Pt states that she was dxed with PNA 03/14/15. She c/o low grade fever and cough-non prod.     tmax 101.9 was the day after Zmax started  Cough better >  Scant yellow mucus started  New R cp started 03/21/15 / has not tried nsaids Mild nausea since 9/4 poor appetite, no vomiting rec  Try advil 200 mg three with meals as needed for fever or pain (tylenol can be used to supplement if needed)  Return first of the week for follow up cxr      03/27/2015 f/u ov/[name] [name] re: cap with R pleuritic cp  Chief Complaint  Patient presents with  . Follow-up    CXR done today. Pt states cough is unchanged "but does not hurt" - still non prod.   much better w/in 24 h of starting advil with no more fever, no more R pleuritic cp, still dry hacking cough  rec Change the advil to where you take it only as needed for pain.  Take delsym two tsp every 12 hours and supplement if needed with  tramadol 50 mg up to 1 every 4 hours to suppress the urge to cough. Swallowing water or using ice chips/non mint and menthol containing candies (such as lifesavers or sugarless jolly ranchers) are also effective.  You should rest your voice and avoid activities that you know make you cough. Once you have eliminated  the cough for 3 straight days try reducing the tramadol first,  then the delsym as tolerated.  No work Aug 31st to 04/03/15     Thoracentesis 03/29/15 only 200 cc/ exudative, nl glucose, neg cyt  04/03/2015 f/u extended ov/[name] [name] re: CAP with R parapneumonic effusion  Chief Complaint  Patient presents with  . Follow-up    Pt c/o feeling weak today. She states that she fainted this am at home.  She states that she has also been having muscle weakness and arthritic like pain in her hands.    still taking advil 200 x 3 tid and felt great one day prior to OV  Then worse since am 04/03/2015  On 03/31/15 developed L sided pleuritic cp > augmentin started and L chest pain improved until woke up with worse pain on L same site am of ov Min cough, no chills, no fever, min sweats rec cta 04/03/15  No evidence of pulmonary embolus. Stable multifocal airspace opacities are noted, most prominently seen in the right middle lobe, consistent with pneumonia. Continued CT follow-up is recommended to ensure resolution and rule out underlying neoplasm. Mild bilateral posterior basilar subsegmental atelectasis is noted. Small bilateral pleural effusions are noted posteriorly.  Admit date: 04/03/2015 Discharge date: 04/10/2015  Discharge Diagnoses:  Active Hospital Problems   Diagnosis Date Noted  . Rheumatoid arthritis involving multiple joints 04/09/2015  . HCAP (healthcare-associated pneumonia)   . Paroxysmal atrial fibrillation   . Bacterial lobar pneumonia 03/21/2015  . Sepsis 03/18/2015  . Hyperlipidemia 11/14/2013    Resolved Hospital Problems   Diagnosis Date Noted Date Resolved  No resolved problems to display.             History of present illness:  64 year old female with past medical history of recent hospitalization for right middle lobe pneumonia complicated by right pleural effusion status post thoracentesis. Despite multiple courses of IV and by mouth  antibiotics, pneumonia has not resolved. Previous cultures and cytology negative for bacteria or malignancy. Patient readmitted on 9/19 for recurrent shortness of breath and worsening pneumonia. On admission, patient noted to be in rapid A. fib, however she converted to normal sinus even prior to starting [facility] Hospital Course:    HCAP (healthcare-associated pneumonia)/persistent bacterial lobar pneumonia: Pulmonary consulted. Started on broad-spectrum antibiotics. Underwent bronchoscopy with cultures and pathology negative. Felt to be initially fungal infection versus resistant bacteria, but now in the setting of joint flare up, felt more to be autoimmune. Patient completed full course of antibiotics prior to discharge.   Paroxysmal atrial fibrillation: Echocardiogram done previous admission one week prior noted normal atria and size. Given that patient is currently in sinus rhythm, she was monitored and stayed in normal sinus rhythm. Her chads 2 vasc score was only 2 and patient hesitant to start on any type of anticoagulation.   /Multiple joint pain/Rheumatoid arthritis involving multiple joints: 1-2 days prior to admission, patient start having issues with significant joint pain and swelling which was new involving her MCP, DIP,  PIP, wrist, bilateral shoulders and right hip. CRP and sedimentation rate elevated. Lab work checked consistent with rheumatoid arthritis flareup. Felt to be secondary to rheumatoid arthritis flareup. Patient's are not Solu-Medrol and had significant improvement area steroids change over to by mouth. She'll follow up with outpatient rheumatology and be discharged on prednisone 60 mg (1 mg/kg) and stay on that dose until follow-up.  leukocytosis: Secondary to steroids, no signs of acute infectious process  Procedures:  Bronchoscopy done 9/22: Cultures and samples negative  Consultations:  Pulmonary       04/23/2015  f/u ov/[name] [name] re: R pna/ parapneumonic  effusion/ new dx RA on high dose pred now per [facility] Chief Complaint  Patient presents with  . Hospitalization Follow-up    Patient doing well.  patient has a lot of questions regarding RA involvement with lungs.  cough, nasal congestion..  Cough more productive since 04/19/15 clear mucus/ no fever / R cp p coughing fit 10/8 better p mobic rx rec While on prednisone I recommend you take prilosec Take 30- 60 min before your first and last meals of the day Rheum f/u planned     05/21/2015  f/u ov/[name] [name] re:  ? RA lung/ effusion  Vs late parapneumonic still on pred 20 mg daily  Chief Complaint  Patient presents with  . Follow-up    Pt states she has continued to have a cough. She states that when she takes a deep breath, she feels as if she is wearing a coresett. Her cough is mainly non prod, but occ produces some clear sputum.    continues to have sensation of tight in chest only with deep breathing = bilaterally like a tight bra @ 20 mg daily avg mobic 3 total pills x 6 weeks helps the hands, not sure it helps the breathing rec Ok to use mobic 15 mg up to daily but take with meals  Taper prednisone 15 mg daily starting 11/10 then 10 mg daily x 06/07/15 and leave it there until you see Deveshwar  Please remember to go to the x-ray department downstairs for your tests - we will call you with the results when they are available. You cannot work due to your hands being stiff from March 13 2015 > per Dr Charlestine Night     07/02/2015  f/u ov/[name] [name] re: RA lung dz/ s/p prob CAP / parapneumonic effusion  Chief Complaint  Patient presents with  . Follow-up    pt following for pleural effusion associated with pulmonary infection. pt states she when she takes a deep breath she can feels like a sore muscle in her back.  pt c/o a liittle dry cough. no c/o SOB, wheezing or chest tightness. no concerns at this time.   with very deep insp has  tight sensation on R less severe and more localized / some aches in  hands since decreased pred to 15 per [place], able to swim now with improve ex tol/ not using much mobic prn at this point   No obvious day to day or daytime variability or assoc cough/ subjective wheeze or overt sinus or hb symptoms. No unusual exp hx or h/o childhood pna/ asthma or knowledge of premature birth.  Sleeping ok without nocturnal  or early am exacerbation  of respiratory  c/o's or need for noct saba. Also denies any obvious fluctuation of symptoms with weather or environmental changes or other aggravating or alleviating factors except as outlined above   Current Medications, Allergies, Complete Past Medical History,  Past Surgical History, Family History, and Social History were reviewed in [employer] record.  ROS  The following are not active complaints unless bolded sore throat, dysphagia, dental problems, itching, sneezing,  nasal congestion or excess/ purulent secretions, ear ache,   fever, chills, sweats, unintended wt loss,  exertional cp, hemoptysis,  orthopnea pnd or leg swelling, presyncope, palpitations, abdominal pain, anorexia, nausea, vomiting, diarrhea  or change in bowel or bladder habits, change in stools or urine, dysuria,hematuria,  rash, arthralgias slt worse as decrease pred visual complaints, headache, numbness, weakness in hands no change  or ataxia or problems with walking or coordination,  change in mood/affect or memory.             Objective:  Physical Exam   Amb talkative wf nad    04/03/2015       130  > 04/23/2015  128  >  05/21/2015  130  > 07/02/2015   133   Wt Readings from Last 3 Encounters:  03/22/15 133 lb 6.4 oz (60.51 kg)  03/18/15 133 lb 9.6 oz (60.6 kg)  04/12/14 135 lb (61.236 kg)    Vital signs reviewed   HEENT: nl dentition, turbinates, and orophanx. Nl external ear canals without cough reflex   NECK :  without JVD/Nodes/TM/ nl carotid upstrokes bilaterally   LUNGS: no acc muscle use, good bs bilaterally,  no rubs   CV:  RRR  no s3 or murmur or increase in P2, no edema   ABD:  soft and nontender with nl excursion in the supine position. No bruits or organomegaly, bowel sounds nl  MS:  warm without deformities, no erythema  SKIN: warm and dry without lesions    NEURO:  alert, approp, no deficits         CXR PA and Lateral:   07/02/2015 :    I personally reviewed images and agree with radiology impression as follows:   Mild residual interstitial coarsening in the right lung base, stable to slightly improved from prior. No evidence of acute abnormality.            Assessment & Plan:

## 2015-07-02 NOTE — Assessment & Plan Note (Addendum)
Started daily pred  04/10/15 p admit with R effusion  > f/u by Charlestine Night   Advised how to use mobic prn as taper steroids/ all f/u re dosing of pred per Dr Charlestine Night  rec f/u cxr at 24 m re RA lung dz

## 2015-07-02 NOTE — Telephone Encounter (Signed)
Left detailed message on pt voicemail per pt request.  Waiting on call back to cancel 6 mo ROV.  Pt is scheduled for June 2017 - this can be cancelled.   Notes Recorded by Tanda Rockers, MD on 07/02/2015 at 10:06 AM Call pt: Reviewed cxr and no acute change so no change in recommendations made at ov - no need for f/u for 6 months unless new symptoms

## 2015-07-03 NOTE — Telephone Encounter (Signed)
Spoke with pt. ROV has been canceled. Nothing further was needed.

## 2015-07-10 NOTE — Assessment & Plan Note (Signed)
Last worked 03/14/15 >  Off until May 24 2015  Present on cxr clearly on 03/27/2015  - CT chest with contrast 03/28/2015 > ? Empyema > 03/29/2015 [place] t centesis >>> exudative 200 ml seroud fluid with wbc 2200 L > N and 25 E, nl glucose and cytology neg  - Repeat CTa 04/03/2015 > no change No evidence of pulmonary embolus. Stable multifocal airspace opacities are noted, most prominently seen in the right middle lobe, consistent with pneumonia. Continued CT follow-up is recommended to ensure resolution and rule out underlying neoplasm. Mild bilateral posterior basilar subsegmental atelectasis is noted. Small bilateral pleural effusions are noted posteriorly. - CXR 04/23/2015 effusion resolved  - CXR 07/02/2015 min residual changes in RML/RLL > no further f/u planned

## 2015-08-01 ENCOUNTER — Ambulatory Visit (INDEPENDENT_AMBULATORY_CARE_PROVIDER_SITE_OTHER): Payer: Managed Care, Other (non HMO) | Admitting: Gynecology

## 2015-08-01 ENCOUNTER — Encounter: Payer: Self-pay | Admitting: Gynecology

## 2015-08-01 VITALS — BP 120/74

## 2015-08-01 DIAGNOSIS — N3000 Acute cystitis without hematuria: Secondary | ICD-10-CM | POA: Diagnosis not present

## 2015-08-01 DIAGNOSIS — R3915 Urgency of urination: Secondary | ICD-10-CM | POA: Diagnosis not present

## 2015-08-01 LAB — URINALYSIS W MICROSCOPIC + REFLEX CULTURE
Bilirubin Urine: NEGATIVE
CASTS: NONE SEEN [LPF]
CRYSTALS: NONE SEEN [HPF]
GLUCOSE, UA: NEGATIVE
Hgb urine dipstick: NEGATIVE
Ketones, ur: NEGATIVE
NITRITE: NEGATIVE
Protein, ur: NEGATIVE
RBC / HPF: NONE SEEN RBC/HPF (ref <=2)
Specific Gravity, Urine: 1.01 (ref 1.001–1.035)
Yeast: NONE SEEN [HPF]
pH: 5.5 (ref 5.0–8.0)

## 2015-08-01 MED ORDER — SULFAMETHOXAZOLE-TRIMETHOPRIM 800-160 MG PO TABS: 1.0000 | ORAL_TABLET | Freq: Two times a day (BID) | ORAL | Status: DC

## 2015-08-01 NOTE — Patient Instructions (Signed)
Take the Septra antibiotic twice daily for 3 days. Follow up if your symptoms persist, worsen or recur. 

## 2015-08-01 NOTE — Progress Notes (Signed)
[name] [name] 05-21-1951 [id number]        64 y.o.  G2P2002 Presents with 1-2 days of suprapubic pressure and some urgency. No real dysuria or frequency. No low back pain, fever or chills or other constitutional symptoms. Is familiar with UTI symptoms and feels like she is getting an early UTI.  Past medical history,surgical history, problem list, medications, allergies, family history and social history were all reviewed and documented in the EPIC chart.  Directed ROS with pertinent positives and negatives documented in the history of present illness/assessment and plan.  Exam: Filed Vitals:   08/01/15 1501  BP: 120/74   General appearance:  Normal Spine straight without CVA tenderness Abdomen soft nontender without masses guarding rebound  Assessment/Plan:  65 y.o. [id number] with symptoms and urinalysis consistent with UTI. Urinalysis shows 10-20 WBC few bacteria. Will treat with Septra DS 1 by mouth twice a day 3 days. Follow up if symptoms persist, worsen or recur.    [name] MD, 3:26 PM 08/01/2015

## 2015-08-03 LAB — URINE CULTURE

## 2015-12-31 ENCOUNTER — Ambulatory Visit: Payer: Medicare Other | Admitting: Internal Medicine

## 2016-01-28 IMAGING — CT CT ANGIO CHEST
2 of 6 series · 19 of 36 positions shown · IV contrast (OMNIPAQUE)
Comparison: Chest radiograph of same day. CT scan March 28, 2015.

CLINICAL DATA: Shortness of breath, cough.

EXAM:
CT ANGIOGRAPHY CHEST WITH CONTRAST
TECHNIQUE: Multidetector CT imaging of the chest was performed using the
standard protocol during bolus administration of intravenous
contrast. Multiplanar CT image reconstructions and MIPs were
obtained to evaluate the vascular anatomy.
CONTRAST:  100mL OMNIPAQUE IOHEXOL 350 MG/ML SOLN

[Series 8: pe thins @ 1mm · axial · 0.66mm/px · z∈[-204,+30]mm · 18 of 262 slices shown]
[im 14/262  lung]
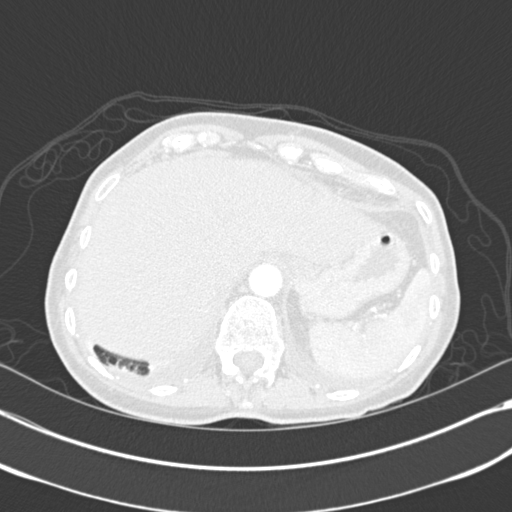
[im 27/262  mediastinal]
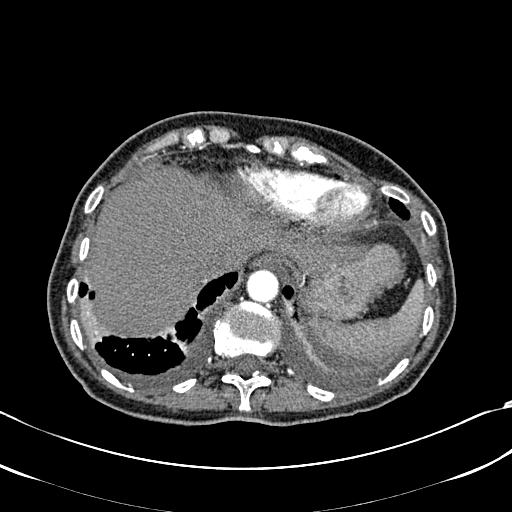
[im 40/262  lung]
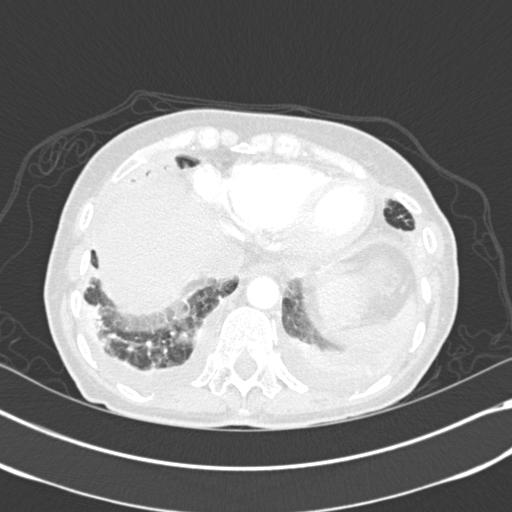
[im 53/262  mediastinal]
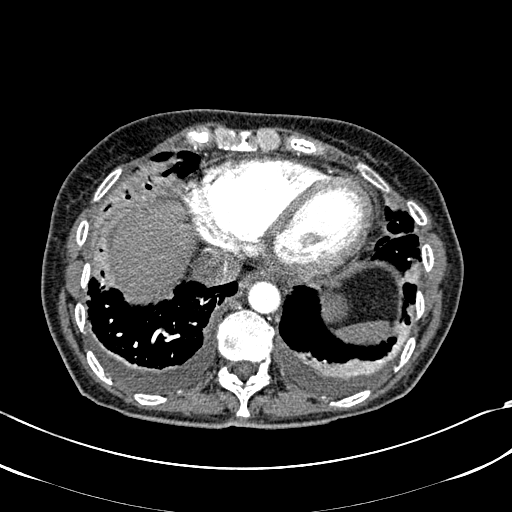
[im 66/262  lung]
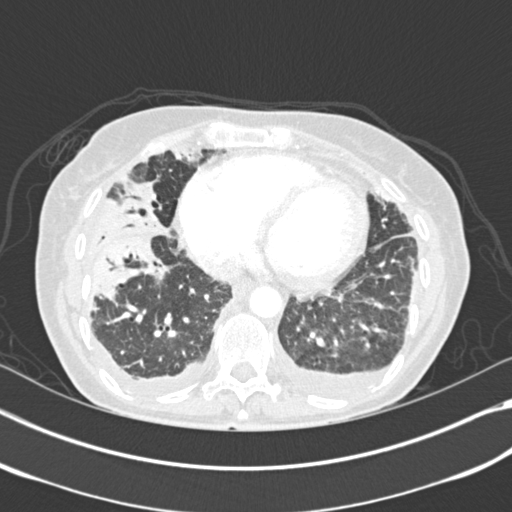
[im 79/262  mediastinal]
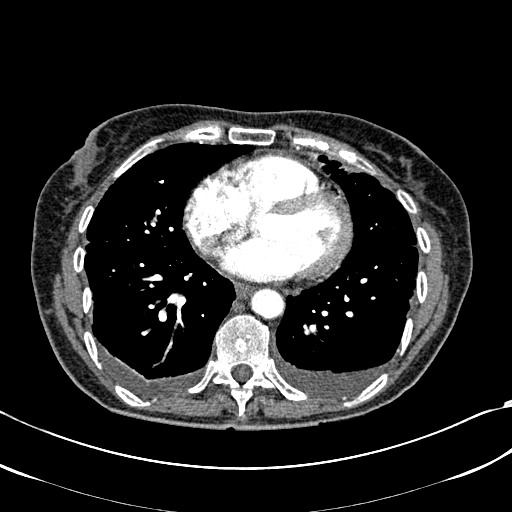
[im 92/262  lung]
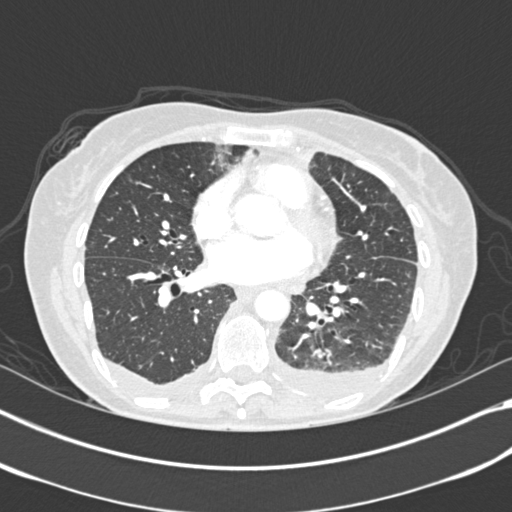
[im 105/262  mediastinal]
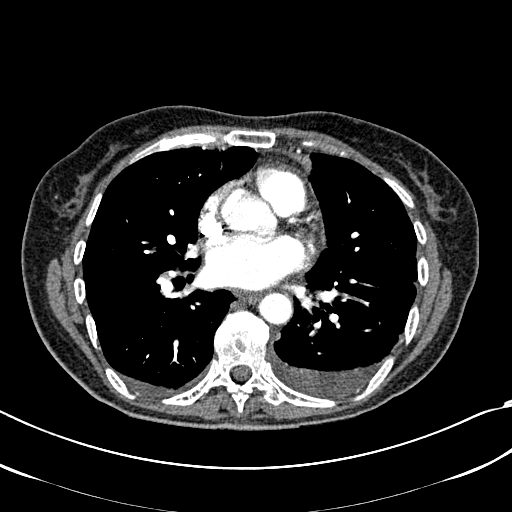
[im 118/262  lung]
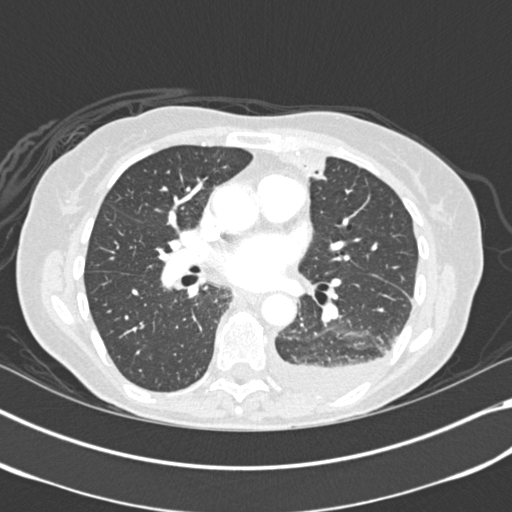
[im 144/262  mediastinal]
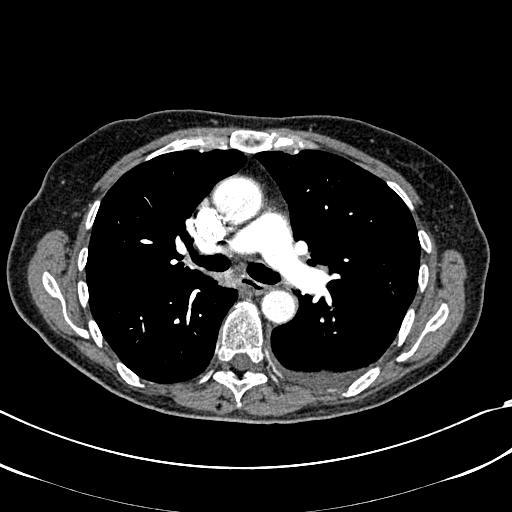
[im 157/262  lung]
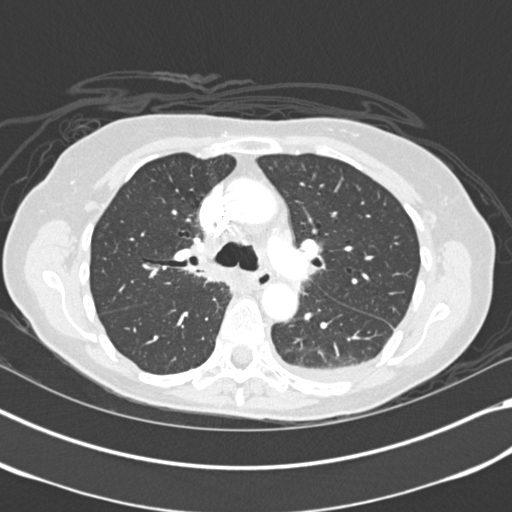
[im 170/262  mediastinal]
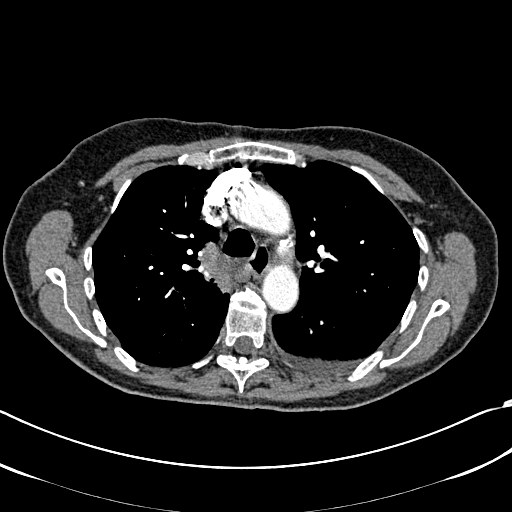
[im 183/262  lung]
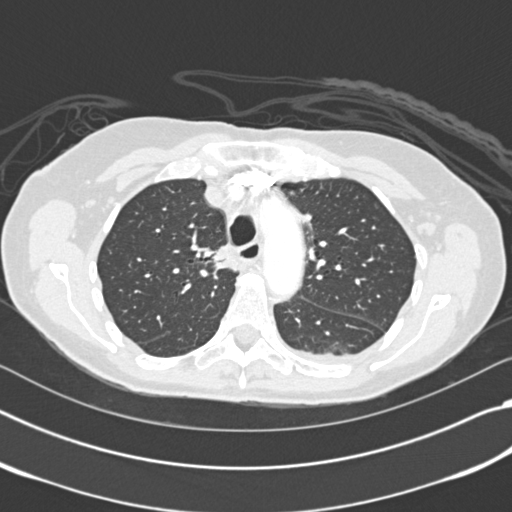
[im 196/262  mediastinal]
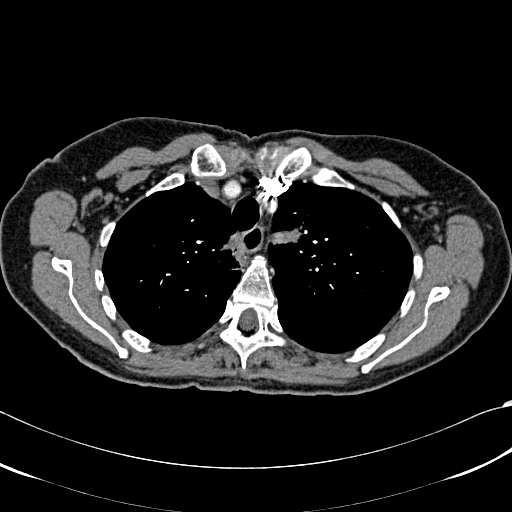
[im 209/262  lung]
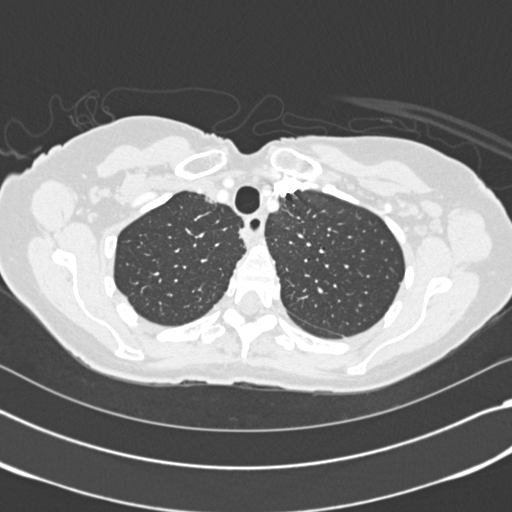
[im 222/262  mediastinal]
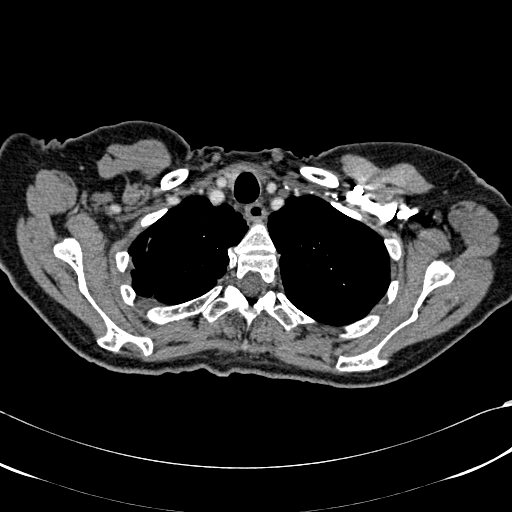
[im 235/262  lung]
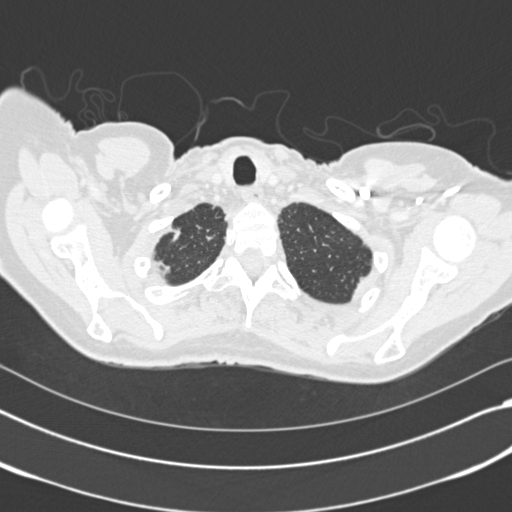
[im 248/262  mediastinal]
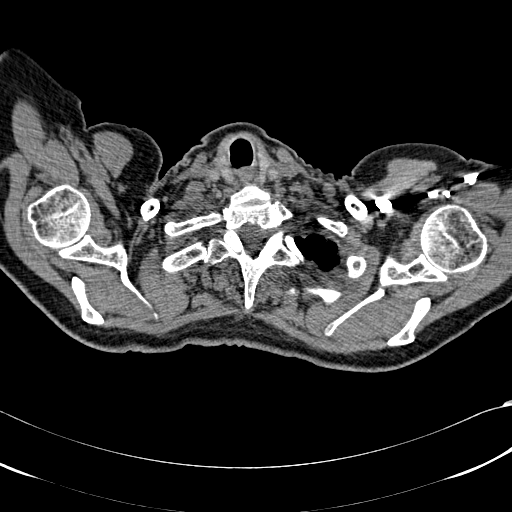

[Series 602: <mpr thick range> · coronal · 0.66mm/px · 1 of 105 slices shown]
[im 53/105  mediastinal]
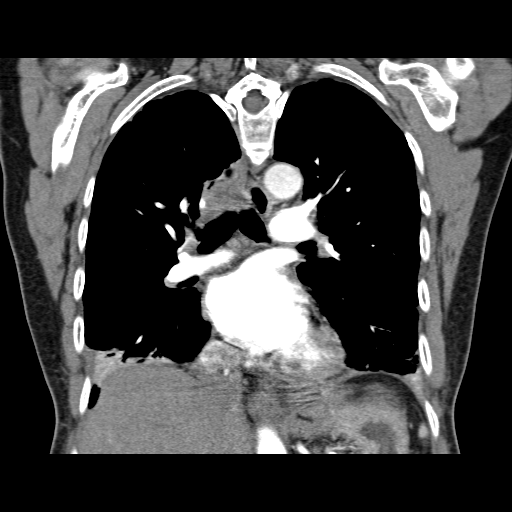

[19 of 36 positions shown; findings below may reference images not displayed]

FINDINGS: No pneumothorax is noted. Small bilateral pleural effusions are
noted posteriorly. Stable right middle lobe opacity is noted with
air bronchograms concerning for pneumonia. Mild bilateral posterior
basilar subsegmental atelectasis is noted. Atelectasis or
inflammation is noted anteriorly in the lingular segment of the left
upper lobe. Stable opacity is noted medially in the left upper lobe
concerning for inflammation. Stable consolidation is noted in the
retro hilar region of the right upper lobe.

There is no evidence of thoracic aortic dissection or aneurysm.
There is no evidence of pulmonary embolus. Visualized portion of
upper abdomen is unremarkable.

Review of the MIP images confirms the above findings.
IMPRESSION: No evidence of pulmonary embolus.

Stable multifocal airspace opacities are noted, most prominently
seen in the right middle lobe, consistent with pneumonia. Continued
CT follow-up is recommended to ensure resolution and rule out
underlying neoplasm.

Mild bilateral posterior basilar subsegmental atelectasis is noted.
Small bilateral pleural effusions are noted posteriorly.

## 2016-02-17 IMAGING — DX DG CHEST 2V
2 series · 2 of 2 positions shown · non-contrast
Comparison: PA and lateral chest x-ray April 09, 2015

CLINICAL DATA: Follow-up right pleural effusion

EXAM:
CHEST  2 VIEW

[chest pa]
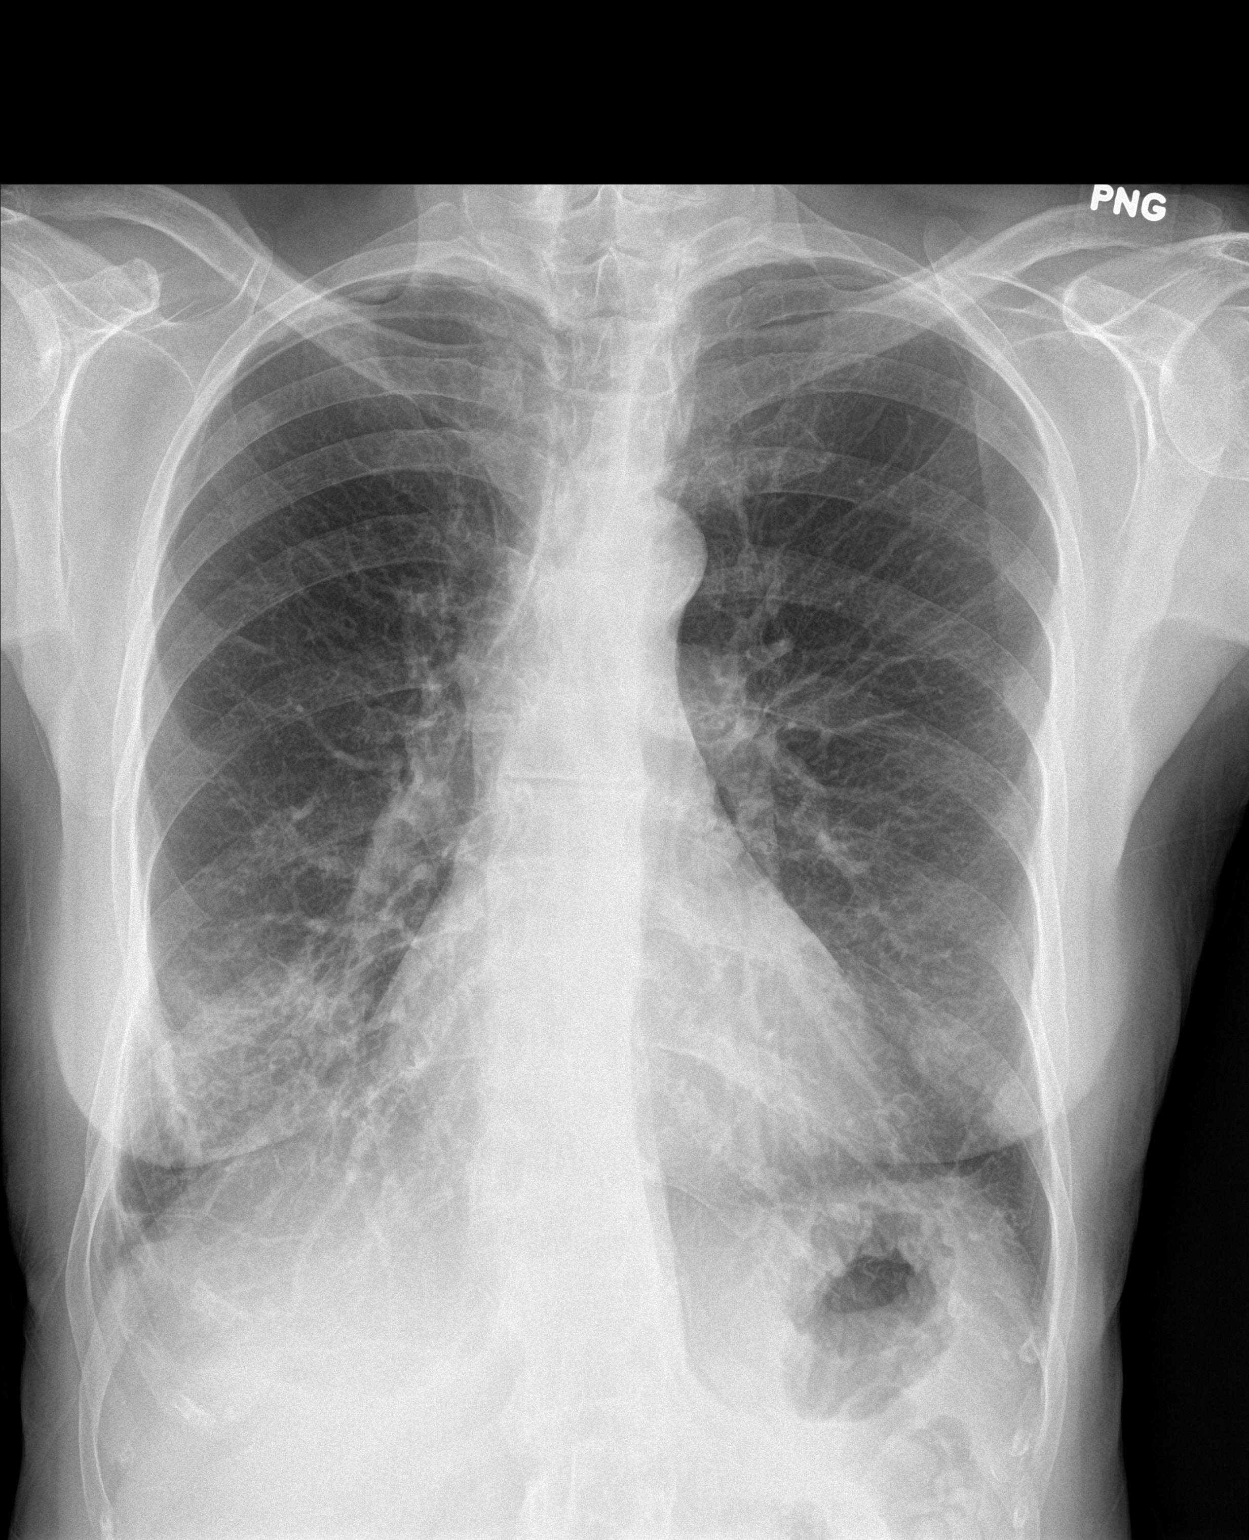

[chest lat]
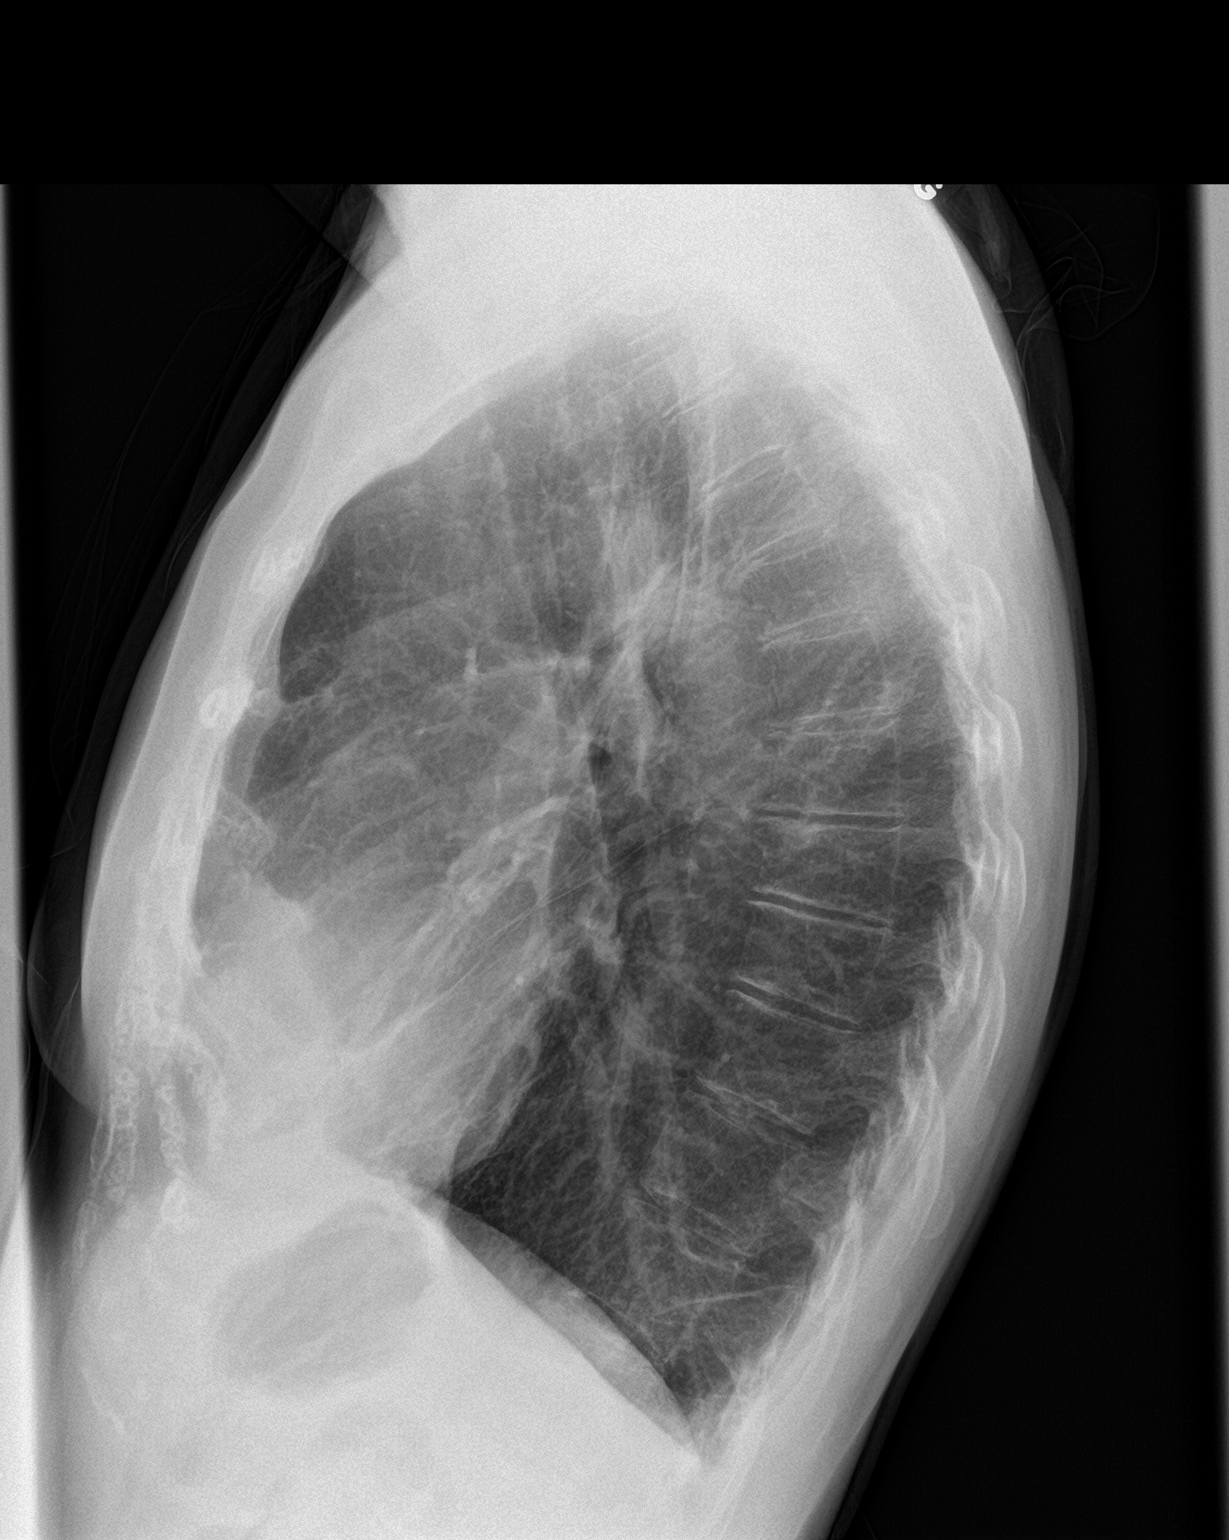

[2 of 2 positions shown; findings below may reference images not displayed]

FINDINGS: There has been near total clearing of the right pleural effusion.
There remains increased density in the right middle lobe which has
also become less conspicuous. The left lung is mildly hyperinflated
and clear. The heart and pulmonary vascularity are normal. The
mediastinum is normal in width. There is gentle thoracic
dextrocurvature convex toward the right.
IMPRESSION: COPD. Interval resolution of the small right pleural effusion. There
is persistent atelectasis or infiltrate in the right middle lobe
anteriorly.

## 2016-02-27 ENCOUNTER — Other Ambulatory Visit: Payer: Self-pay | Admitting: Gastroenterology

## 2016-03-16 IMAGING — DX DG CHEST 2V
2 series · 2 of 2 positions shown · non-contrast
Comparison: PA and lateral chest x-ray April 23, 2015

CLINICAL DATA: Follow-up of right pleural effusion and pneumonia;
persistent mild cough

EXAM:
CHEST  2 VIEW

[chest pa]
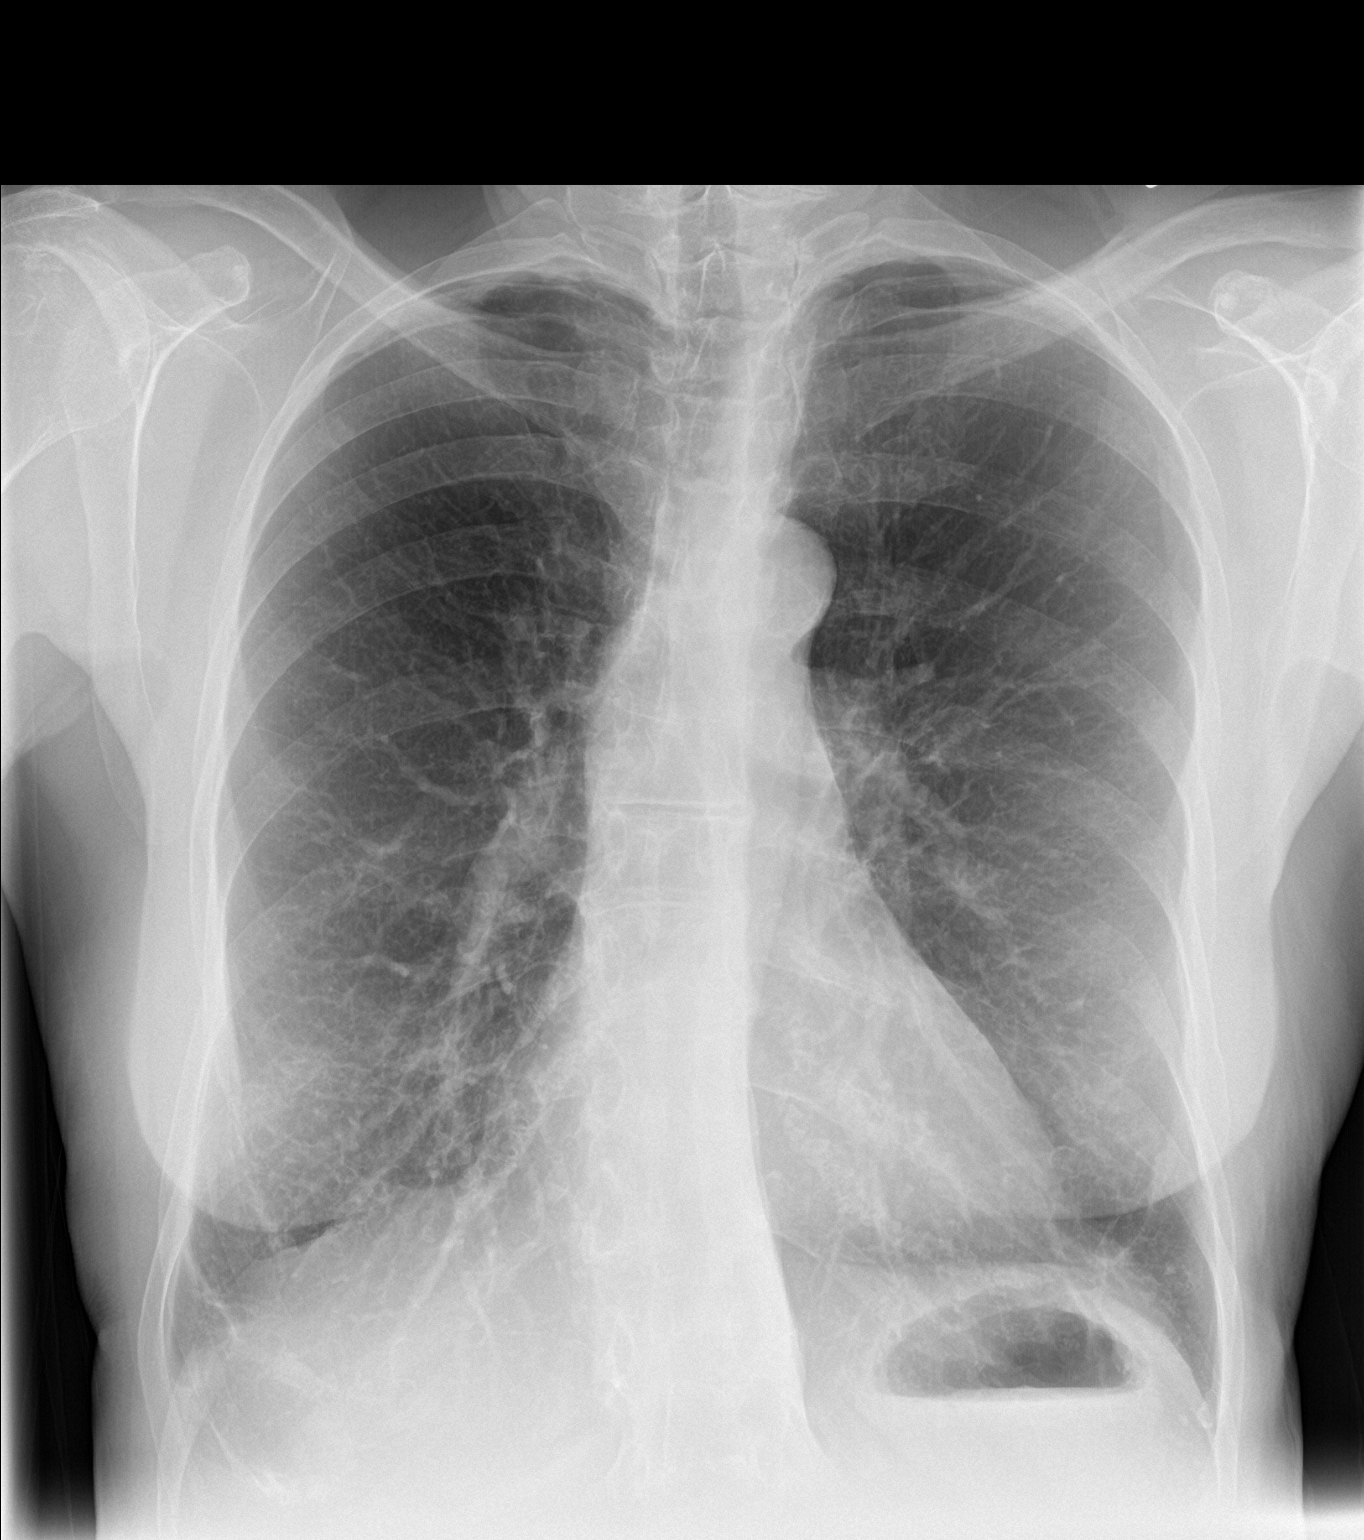

[chest lat]
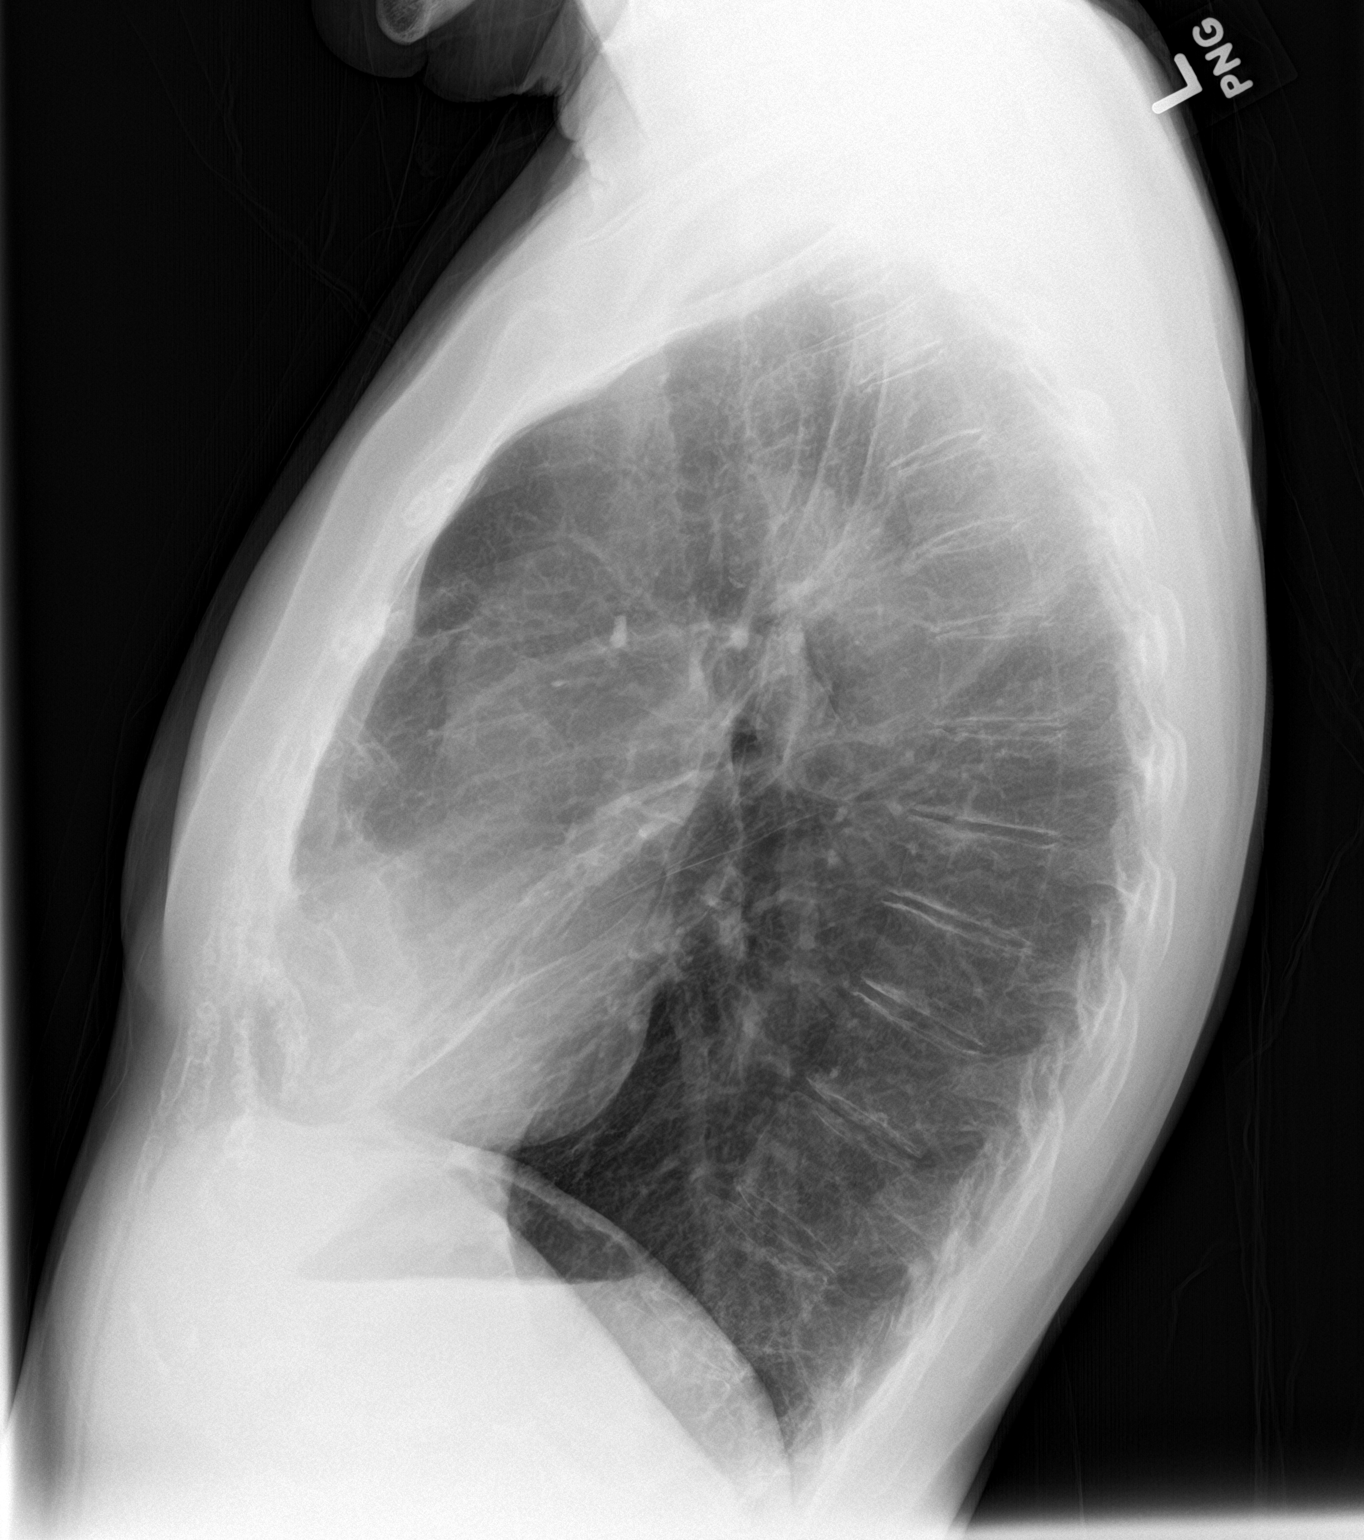

[2 of 2 positions shown; findings below may reference images not displayed]

FINDINGS: The lungs remain hyperinflated. Density in the right middle lobe is
markedly improved but the interstitial markings remain coarse. There
is no pleural effusion or pneumothorax. The heart and pulmonary
vascularity are normal. The mediastinum is normal in width. There is
stable curvature of the mid thoracic spine convex toward the right.
IMPRESSION: Stable hyperinflation consistent with reactive airway disease or
COPD. Improved appearance of the right middle lobe with some
residual interstitial density noted. An additional follow-up chest
x-ray in 2-3 weeks is recommended to assure complete clearing.

## 2016-03-31 ENCOUNTER — Encounter (HOSPITAL_COMMUNITY): Payer: Self-pay | Admitting: *Deleted

## 2016-04-07 ENCOUNTER — Ambulatory Visit (HOSPITAL_COMMUNITY)
Admission: RE | Admit: 2016-04-07 | Payer: Managed Care, Other (non HMO) | Source: Ambulatory Visit | Admitting: Gastroenterology

## 2016-04-07 ENCOUNTER — Encounter (HOSPITAL_COMMUNITY): Payer: Self-pay | Admitting: Certified Registered Nurse Anesthetist

## 2016-04-07 ENCOUNTER — Encounter (HOSPITAL_COMMUNITY): Admission: RE | Payer: Self-pay | Source: Ambulatory Visit

## 2016-04-07 SURGERY — COLONOSCOPY WITH PROPOFOL
Anesthesia: Monitor Anesthesia Care

## 2016-05-15 ENCOUNTER — Ambulatory Visit (INDEPENDENT_AMBULATORY_CARE_PROVIDER_SITE_OTHER): Payer: PPO | Admitting: Gynecology

## 2016-05-15 ENCOUNTER — Other Ambulatory Visit: Payer: Self-pay | Admitting: Gynecology

## 2016-05-15 ENCOUNTER — Encounter: Payer: Self-pay | Admitting: Gynecology

## 2016-05-15 VITALS — BP 120/72

## 2016-05-15 DIAGNOSIS — N390 Urinary tract infection, site not specified: Secondary | ICD-10-CM

## 2016-05-15 LAB — URINALYSIS W MICROSCOPIC + REFLEX CULTURE
Bilirubin Urine: NEGATIVE
CRYSTALS: NONE SEEN [HPF]
Casts: NONE SEEN [LPF]
GLUCOSE, UA: NEGATIVE
HGB URINE DIPSTICK: NEGATIVE
KETONES UR: NEGATIVE
Nitrite: NEGATIVE
PH: 6.5 (ref 5.0–8.0)
PROTEIN: NEGATIVE
Specific Gravity, Urine: 1.02 (ref 1.001–1.035)
YEAST: NONE SEEN [HPF]

## 2016-05-15 MED ORDER — SULFAMETHOXAZOLE-TRIMETHOPRIM 800-160 MG PO TABS: 1.0000 | ORAL_TABLET | Freq: Two times a day (BID) | ORAL | 0 refills | Status: DC

## 2016-05-15 NOTE — Progress Notes (Signed)
    [name] [name] 1951/01/16 [id number]        65 y.o.  G2P2002 presents with one-day history of suprapubic pressure. She feels like she is getting an early UTI. No frequency urgency dysuria. No low back pain fever or chills. No vaginal discharge irritation or odor.  Past medical history,surgical history, problem list, medications, allergies, family history and social history were all reviewed and documented in the EPIC chart.  Directed ROS with pertinent positives and negatives documented in the history of present illness/assessment and plan.  Exam: Vitals:   05/15/16 1420  BP: 120/72   General appearance:  Normal Spine straight without CVA tenderness Abdomen soft nontender without masses guarding rebound  Assessment/Plan:  65 y.o. [id number] with history and urinalysis consistent with early UTI. Treat with Septra DS 1 by mouth twice a day 3 days. Follow up if symptoms persist, worsen or recur.    [name] MD, 2:41 PM 05/15/2016

## 2016-05-15 NOTE — Addendum Note (Signed)
Addended by: Nelva Nay on: 05/15/2016 02:51 PM   Modules accepted: Orders

## 2016-05-15 NOTE — Patient Instructions (Signed)
Take the antibiotic pill twice daily for 3 days.  Follow-up if your symptoms persist, worsen or recur. 

## 2016-05-18 LAB — URINE CULTURE

## 2016-05-26 DIAGNOSIS — E7800 Pure hypercholesterolemia, unspecified: Secondary | ICD-10-CM | POA: Diagnosis not present

## 2016-05-26 DIAGNOSIS — Z0000 Encounter for general adult medical examination without abnormal findings: Secondary | ICD-10-CM | POA: Diagnosis not present

## 2016-05-26 DIAGNOSIS — M810 Age-related osteoporosis without current pathological fracture: Secondary | ICD-10-CM | POA: Diagnosis not present

## 2016-05-26 DIAGNOSIS — C439 Malignant melanoma of skin, unspecified: Secondary | ICD-10-CM | POA: Diagnosis not present

## 2016-07-29 DIAGNOSIS — M0570 Rheumatoid arthritis with rheumatoid factor of unspecified site without organ or systems involvement: Secondary | ICD-10-CM | POA: Diagnosis not present

## 2016-07-29 DIAGNOSIS — R197 Diarrhea, unspecified: Secondary | ICD-10-CM | POA: Diagnosis not present

## 2016-07-29 DIAGNOSIS — Z79899 Other long term (current) drug therapy: Secondary | ICD-10-CM | POA: Diagnosis not present

## 2016-08-26 DIAGNOSIS — Z1231 Encounter for screening mammogram for malignant neoplasm of breast: Secondary | ICD-10-CM | POA: Diagnosis not present

## 2016-09-18 DIAGNOSIS — L219 Seborrheic dermatitis, unspecified: Secondary | ICD-10-CM | POA: Diagnosis not present

## 2016-09-18 DIAGNOSIS — D225 Melanocytic nevi of trunk: Secondary | ICD-10-CM | POA: Diagnosis not present

## 2016-09-18 DIAGNOSIS — Z85820 Personal history of malignant melanoma of skin: Secondary | ICD-10-CM | POA: Diagnosis not present

## 2016-09-18 DIAGNOSIS — L814 Other melanin hyperpigmentation: Secondary | ICD-10-CM | POA: Diagnosis not present

## 2016-09-18 DIAGNOSIS — L570 Actinic keratosis: Secondary | ICD-10-CM | POA: Diagnosis not present

## 2016-09-18 DIAGNOSIS — Z85828 Personal history of other malignant neoplasm of skin: Secondary | ICD-10-CM | POA: Diagnosis not present

## 2016-09-18 DIAGNOSIS — D1800 Hemangioma unspecified site: Secondary | ICD-10-CM | POA: Diagnosis not present

## 2016-09-18 DIAGNOSIS — L821 Other seborrheic keratosis: Secondary | ICD-10-CM | POA: Diagnosis not present

## 2016-10-03 DIAGNOSIS — J3489 Other specified disorders of nose and nasal sinuses: Secondary | ICD-10-CM | POA: Diagnosis not present

## 2016-11-04 ENCOUNTER — Encounter (HOSPITAL_COMMUNITY): Payer: Self-pay

## 2016-11-04 ENCOUNTER — Ambulatory Visit (HOSPITAL_COMMUNITY): Payer: PPO | Admitting: Anesthesiology

## 2016-11-04 ENCOUNTER — Encounter (HOSPITAL_COMMUNITY)
Admission: RE | Disposition: A | Discharge status: Home / Self Care | Payer: Self-pay | Source: Ambulatory Visit | Attending: Gastroenterology

## 2016-11-04 ENCOUNTER — Ambulatory Visit (HOSPITAL_COMMUNITY)
Admission: RE | Admit: 2016-11-04 | Discharge: 2016-11-04 | Disposition: A | Discharge status: Home / Self Care | Payer: PPO | Source: Ambulatory Visit | Attending: Gastroenterology | Admitting: Gastroenterology

## 2016-11-04 DIAGNOSIS — E785 Hyperlipidemia, unspecified: Secondary | ICD-10-CM | POA: Diagnosis not present

## 2016-11-04 DIAGNOSIS — M810 Age-related osteoporosis without current pathological fracture: Secondary | ICD-10-CM | POA: Diagnosis not present

## 2016-11-04 DIAGNOSIS — I480 Paroxysmal atrial fibrillation: Secondary | ICD-10-CM | POA: Diagnosis not present

## 2016-11-04 DIAGNOSIS — I4891 Unspecified atrial fibrillation: Secondary | ICD-10-CM | POA: Diagnosis not present

## 2016-11-04 DIAGNOSIS — K219 Gastro-esophageal reflux disease without esophagitis: Secondary | ICD-10-CM | POA: Insufficient documentation

## 2016-11-04 DIAGNOSIS — Z1211 Encounter for screening for malignant neoplasm of colon: Secondary | ICD-10-CM | POA: Diagnosis not present

## 2016-11-04 DIAGNOSIS — Z886 Allergy status to analgesic agent status: Secondary | ICD-10-CM | POA: Diagnosis not present

## 2016-11-04 DIAGNOSIS — M069 Rheumatoid arthritis, unspecified: Secondary | ICD-10-CM | POA: Insufficient documentation

## 2016-11-04 DIAGNOSIS — Z882 Allergy status to sulfonamides status: Secondary | ICD-10-CM | POA: Insufficient documentation

## 2016-11-04 HISTORY — PX: COLONOSCOPY WITH PROPOFOL: SHX5780

## 2016-11-04 SURGERY — COLONOSCOPY WITH PROPOFOL
Anesthesia: Monitor Anesthesia Care

## 2016-11-04 MED ORDER — PROPOFOL 10 MG/ML IV BOLUS
INTRAVENOUS | Status: DC | PRN
  Administered 2016-11-04: 20 mg via INTRAVENOUS
  Administered 2016-11-04: 30 mg via INTRAVENOUS

## 2016-11-04 MED ORDER — PROPOFOL 500 MG/50ML IV EMUL
INTRAVENOUS | Status: DC | PRN
  Administered 2016-11-04: 125 ug/kg/min via INTRAVENOUS

## 2016-11-04 MED ORDER — LIDOCAINE 2% (20 MG/ML) 5 ML SYRINGE
INTRAMUSCULAR | Status: DC | PRN
  Administered 2016-11-04: 60 mg via INTRAVENOUS

## 2016-11-04 MED ORDER — LIDOCAINE 2% (20 MG/ML) 5 ML SYRINGE
INTRAMUSCULAR | Status: AC
  Filled 2016-11-04: qty 5

## 2016-11-04 MED ORDER — PROPOFOL 10 MG/ML IV BOLUS
INTRAVENOUS | Status: AC
Start: 2016-11-04 — End: 2016-11-04
  Filled 2016-11-04: qty 60

## 2016-11-04 MED ORDER — LACTATED RINGERS IV SOLN
INTRAVENOUS | Status: DC
  Administered 2016-11-04: 1000 mL via INTRAVENOUS

## 2016-11-04 SURGICAL SUPPLY — 22 items

## 2016-11-04 NOTE — Discharge Instructions (Signed)

## 2016-11-04 NOTE — Op Note (Signed)
[facility] Patient Name: [name] [name] Procedure Date: 11/04/2016 MRN: [id number] Attending MD: Garlan Fair , MD Date of Birth: Jun 04, 1951 CSN: [id number] Age: 66 Admit Type: Outpatient Procedure:                Colonoscopy Indications:              Screening for colorectal malignant neoplasm Providers:                Garlan Fair, MD, Laverta Baltimore RN, RN,                            Cherylynn Ridges, Technician, Dione Booze, CRNA Referring MD:              Medicines:                Propofol per Anesthesia Complications:            No immediate complications. Estimated Blood Loss:     Estimated blood loss: none. Procedure:                Pre-Anesthesia Assessment:                           - Prior to the procedure, a History and Physical                            was performed, and patient medications and                            allergies were reviewed. The patient's tolerance of                            previous anesthesia was also reviewed. The risks                            and benefits of the procedure and the sedation                            options and risks were discussed with the patient.                            All questions were answered, and informed consent                            was obtained. Prior Anticoagulants: The patient has                            taken aspirin, last dose was 1 day prior to                            procedure. ASA Grade Assessment: II - A patient                            with mild systemic disease. After reviewing the  risks and benefits, the patient was deemed in                            satisfactory condition to undergo the procedure.                           After obtaining informed consent, the colonoscope                            was passed under direct vision. Throughout the                            procedure, the patient's blood pressure, pulse, and                             oxygen saturations were monitored continuously. The                            was introduced through the anus and advanced to the                            the cecum, identified by appendiceal orifice and                            ileocecal valve. The colonoscopy was performed                            without difficulty. The patient tolerated the                            procedure well. The quality of the bowel                            preparation was good. The terminal ileum, the                            ileocecal valve, the appendiceal orifice and the                            rectum were photographed. Scope In: 9:01:58 AM Scope Out: 9:22:23 AM Scope Withdrawal Time: 0 hours 10 minutes 54 seconds  Total Procedure Duration: 0 hours 20 minutes 25 seconds  Findings:      The perianal and digital rectal examinations were normal.      The entire examined colon appeared normal. Impression:               - The entire examined colon is normal.                           - No specimens collected. Moderate Sedation:      N/A- Per Anesthesia Care Recommendation:           - Patient has a contact number available for  emergencies. The signs and symptoms of potential                            delayed complications were discussed with the                            patient. Return to normal activities tomorrow.                            Written discharge instructions were provided to the                            patient.                           - Repeat colonoscopy in 10 years for screening                            purposes.                           - Resume previous diet.                           - Continue present medications. Procedure Code(s):        --- Professional ---                           O0321, Colorectal cancer screening; colonoscopy on                            individual not meeting criteria for high  risk Diagnosis Code(s):        --- Professional ---                           Z12.11, Encounter for screening for malignant                            neoplasm of colon CPT copyright 2016 [employer]. All rights reserved. The codes documented in this report are preliminary and upon coder review may  be revised to meet current compliance requirements. Earle Gell, MD Garlan Fair, MD 11/04/2016 9:28:05 AM This report has been signed electronically. Number of Addenda: 0

## 2016-11-04 NOTE — Anesthesia Postprocedure Evaluation (Signed)
Anesthesia Post Note  Patient: [name] [name]  Procedure(s) Performed: Procedure(s) (LRB): COLONOSCOPY WITH PROPOFOL (N/A)  Patient location during evaluation: PACU Anesthesia Type: MAC Level of consciousness: awake and alert Pain management: pain level controlled Vital Signs Assessment: post-procedure vital signs reviewed and stable Respiratory status: spontaneous breathing, nonlabored ventilation, respiratory function stable and patient connected to nasal cannula oxygen Cardiovascular status: stable and blood pressure returned to baseline Anesthetic complications: no       Last Vitals:  Vitals:   11/04/16 0757 11/04/16 0930  BP: (!) 133/52 (!) 120/97  Pulse: 72 75  Resp: 15 15  Temp: 36.7 C 36.6 C    Last Pain:  Vitals:   11/04/16 0930  TempSrc: Oral                 Devera Englander S

## 2016-11-04 NOTE — H&P (Signed)
Procedure: Baseline screening colonoscopy  History: The patient is a 66 year old female born 11/17/1966. He is scheduled to undergo his first screening colonoscopy with polypectomy to prevent colon cancer.  Medication allergies: Sulfa drugs and ibuprofen cause rash  Past medical history: Gastroesophageal reflux  Family history: Negative for colon cancer  Exam: The patient is alert and lying comfortably on the endoscopy stretcher. Abdomen is soft and nontender to palpation. Lungs are clear to auscultation. Cardiac exam reveals a regular rhythm.  Plan: Proceed with baseline screening colonoscopy

## 2016-11-04 NOTE — Transfer of Care (Signed)
Immediate Anesthesia Transfer of Care Note  Patient: [name] [name]  Procedure(s) Performed: Procedure(s): COLONOSCOPY WITH PROPOFOL (N/A)  Patient Location: PACU and Endoscopy Unit  Anesthesia Type:MAC  Level of Consciousness: awake, alert  and patient cooperative  Airway & Oxygen Therapy: Patient Spontanous Breathing and Patient connected to face mask oxygen  Post-op Assessment: Report given to RN and Post -op Vital signs reviewed and stable  Post vital signs: Reviewed and stable  Last Vitals:  Vitals:   11/04/16 0757  BP: (!) 133/52  Pulse: 72  Resp: 15  Temp: 36.7 C    Last Pain:  Vitals:   11/04/16 0757  TempSrc: Oral         Complications: No apparent anesthesia complications

## 2016-11-04 NOTE — Anesthesia Preprocedure Evaluation (Signed)
Anesthesia Evaluation  Patient identified by MRN, date of birth, ID band Patient awake    Reviewed: Allergy & Precautions, NPO status , Patient's Chart, lab work & pertinent test results  Airway Mallampati: II  TM Distance: >3 FB Neck ROM: Full    Dental no notable dental hx.    Pulmonary neg pulmonary ROS,    Pulmonary exam normal breath sounds clear to auscultation       Cardiovascular Normal cardiovascular exam+ dysrhythmias Atrial Fibrillation  Rhythm:Regular Rate:Normal     Neuro/Psych negative neurological ROS  negative psych ROS   GI/Hepatic negative GI ROS, Neg liver ROS,   Endo/Other  negative endocrine ROS  Renal/GU negative Renal ROS  negative genitourinary   Musculoskeletal  (+) Arthritis , Rheumatoid disorders,    Abdominal   Peds negative pediatric ROS (+)  Hematology negative hematology ROS (+)   Anesthesia Other Findings   Reproductive/Obstetrics negative OB ROS                             Anesthesia Physical Anesthesia Plan  ASA: II  Anesthesia Plan: MAC   Post-op Pain Management:    Induction: Intravenous  Airway Management Planned: Nasal Cannula  Additional Equipment:   Intra-op Plan:   Post-operative Plan:   Informed Consent: I have reviewed the patients History and Physical, chart, labs and discussed the procedure including the risks, benefits and alternatives for the proposed anesthesia with the patient or authorized representative who has indicated his/her understanding and acceptance.   Dental advisory given  Plan Discussed with: CRNA and Surgeon  Anesthesia Plan Comments:         Anesthesia Quick Evaluation

## 2016-11-04 NOTE — Anesthesia Procedure Notes (Signed)
Procedure Name: MAC Date/Time: 11/04/2016 8:55 AM Performed by: Dione Booze Pre-anesthesia Checklist: Emergency Drugs available, Suction available, Patient being monitored and Patient identified Patient Re-evaluated:Patient Re-evaluated prior to inductionOxygen Delivery Method: Simple face mask Placement Confirmation: CO2 detector

## 2016-11-04 NOTE — H&P (Signed)
Procedure: Repeat screening colonoscopy. Normal baseline screening colonoscopy was performed on 07/30/2005  History: The patient is a 66 year old female born 10-15-1950. Scheduled to undergo a repeat screening colonoscopy today.  Medication allergies: None  Past medical history: Rheumatoid arthritis. Paroxysmal atrial fibrillation.  Exam: The patient is alert and lying comfortably on the endoscopy stretcher. Cardiac exam reveals a regular rhythm. Lungs are clear to auscultation. Abdomen is soft and nontender to palpation.  Plan: Proceed with screening colonoscopy

## 2016-11-06 ENCOUNTER — Encounter (HOSPITAL_COMMUNITY): Payer: Self-pay | Admitting: Gastroenterology

## 2016-12-01 DIAGNOSIS — M65312 Trigger thumb, left thumb: Secondary | ICD-10-CM | POA: Insufficient documentation

## 2016-12-01 DIAGNOSIS — Z8639 Personal history of other endocrine, nutritional and metabolic disease: Secondary | ICD-10-CM | POA: Insufficient documentation

## 2016-12-01 DIAGNOSIS — Z79899 Other long term (current) drug therapy: Secondary | ICD-10-CM | POA: Insufficient documentation

## 2016-12-01 DIAGNOSIS — M19041 Primary osteoarthritis, right hand: Secondary | ICD-10-CM | POA: Insufficient documentation

## 2016-12-01 DIAGNOSIS — Z8701 Personal history of pneumonia (recurrent): Secondary | ICD-10-CM | POA: Insufficient documentation

## 2016-12-01 DIAGNOSIS — R700 Elevated erythrocyte sedimentation rate: Secondary | ICD-10-CM | POA: Insufficient documentation

## 2016-12-01 DIAGNOSIS — Z8709 Personal history of other diseases of the respiratory system: Secondary | ICD-10-CM | POA: Insufficient documentation

## 2016-12-01 DIAGNOSIS — Z8781 Personal history of (healed) traumatic fracture: Secondary | ICD-10-CM | POA: Insufficient documentation

## 2016-12-01 DIAGNOSIS — M19042 Primary osteoarthritis, left hand: Secondary | ICD-10-CM

## 2016-12-01 NOTE — Progress Notes (Signed)
Office Visit Note  Patient: [name] [name]             Date of Birth: 1951-03-03           MRN: [id number]             PCP: [name] Erp, MD Referring: [name] Erp, MD Visit Date: 12/15/2016 Occupation: Horticultural therapist    Subjective:  Pain hands   History of Present Illness: [name] [name] is a 66 y.o. female with sero positive rheumatoid arthritis seen in consultation per request of her PCP. According to patient her symptoms started in September 2016 when she developed low-grade fever or cough and fatigue. She was initially given Z-Pak without any improvement. She was hospitalized with pneumonia and was in the hospital for 3-4 days on IV antibiotics she recalls having thoracentesis. She was seen by Dr. Melvyn [name] who did his chest x-ray and CT scan in 2 weeks later she developed some joint swelling. She was discharged home and her fever fatigue recurred. Her joint symptoms got worse to the point she was in a wheelchair. She went back to the emergency department where she had IV antibiotics. She was rehospitalized and was seen by Dr. Lake [name]. He diagnosed her with autoimmune disease and did a bronchoscopy. He also diagnosed her with rheumatoid arthritis of and started her on prednisone 60 mg by mouth daily and she started feeling immediately better. She was referred to Dr. Gavin [name] and was seen by their nurse practitioner. She was advised to taper her prednisone to 40 mg over 2 weeks. When she went for follow-up visit patient states that she did not have a physical exam and was offered Humira. She decided to switch practices and started going to Dr. Charlestine [name]. She states Dr. Charlestine [name] first tapered her prednisone down to 5 mg when she developed left knee joint swelling she was restarted on prednisone and Arava combination. The prednisone was tapered off and she stayed on Arava 20 mg a day. She states she has had few flares since then. In the last 6 months she has had 3 flares requiring prednisone. She  has had flares in her left knee left elbow and her hands. She states she was doing fairly well until recently but in the last months because of the increased workload she's not feeling as good. She has noticed some low-grade fever and also the cough has returned. She denies any joint swelling currently. She has not been seen by a pulmonologist. She takes one 10 mg tablet when necessary she states taking 1 tablet last week and 1 tablet 2 weeks ago.  Activities of Daily Living:  Patient reports morning stiffness for 0 minute.   Patient Denies nocturnal pain.  Difficulty dressing/grooming: Denies Difficulty climbing stairs: Denies Difficulty getting out of chair: Denies Difficulty using hands for taps, buttons, cutlery, and/or writing: Denies   Review of Systems  Constitutional: Positive for fatigue and fever. Negative for [name] sweats, weight gain, weight loss and weakness.  HENT: Negative for mouth sores, trouble swallowing, trouble swallowing, mouth dryness and nose dryness.   Eyes: Negative for pain, redness, visual disturbance and dryness.  Respiratory: Positive for cough. Negative for shortness of breath and difficulty breathing.   Cardiovascular: Negative for chest pain, palpitations, hypertension, irregular heartbeat and swelling in legs/feet.  Gastrointestinal: Negative for blood in stool, constipation and diarrhea.  Endocrine: Negative for increased urination.  Genitourinary: Negative for vaginal dryness.  Musculoskeletal: Positive for arthralgias and joint pain. Negative for joint swelling, myalgias,  muscle weakness, morning stiffness, muscle tenderness and myalgias.  Skin: Negative for color change, rash, hair loss, skin tightness, ulcers and sensitivity to sunlight.  Allergic/Immunologic: Negative for susceptible to infections.  Neurological: Negative for dizziness, memory loss and [name] sweats.  Hematological: Negative for swollen glands.  Psychiatric/Behavioral: Negative for  depressed mood and sleep disturbance. The patient is not nervous/anxious.     PMFS History:  Patient Active Problem List   Diagnosis Date Noted  . History of pleural effusion 12/01/2016  . History of recurrent pneumonia 12/01/2016  . History of hyperlipidemia 12/01/2016  . High risk medication use 12/01/2016  . Trigger thumb, left thumb 12/01/2016  . Elevated sed rate 12/01/2016  . History of fracture of right hip 12/01/2016  . Primary osteoarthritis of both hands 12/01/2016  . Rheumatoid arthritis involving multiple joints ([place]) 04/09/2015  . HCAP (healthcare-associated pneumonia)   . Paroxysmal atrial fibrillation (HCC)   . Tendonitis 04/03/2015  . Pleural effusion associated with pulmonary infection 03/27/2015  . Orthostatic syncope   . Syncope 03/18/2015  . Osteoporosis   . Hyperlipidemia 11/14/2013    Past Medical History:  Diagnosis Date  . Elevated cholesterol   . Migraines    occular  . Osteoporosis 2015   based on hip fracture from fall  DEXA 04/2015 T score -2.4 left forearm, stable at hip and spine from prior DEXA.  Marland Kitchen Pleural effusion 9/206   Bilateral effusions, s/p thora on R c/w exudate  . Pneumonia 03/2015   R  . RA (rheumatoid arthritis) ([place])   . Skin cancer (melanoma) (HCC)    Right Arm, nose.     Family History  Problem Relation Age of Onset  . Heart attack Father 37  . Heart disease Father    Past Surgical History:  Procedure Laterality Date  . COLONOSCOPY WITH PROPOFOL N/A 11/04/2016   Procedure: COLONOSCOPY WITH PROPOFOL;  Surgeon: Garlan Fair, MD;  Location: [facility] ENDOSCOPY;  Service: Endoscopy;  Laterality: N/A;  . FINGER SURGERY    . HIP ARTHROPLASTY Right 11/14/2013   Procedure: RIGHT CANNULATED HIP PINNING;  Surgeon: Alta Corning, MD;  Location: [facility] ORS;  Service: Orthopedics;  Laterality: Right;  BIOMET  . 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 She appears well-developed and well-nourished.  HENT:  Head: Normocephalic and atraumatic.  Eyes: Conjunctivae and EOM are normal.  Neck: Normal range of motion.  Cardiovascular: Normal rate, regular rhythm, normal heart sounds and intact distal pulses.   Pulmonary/Chest: Effort normal and breath sounds normal.  Abdominal: Soft. Bowel sounds are normal.  Lymphadenopathy:    She has no cervical adenopathy.  Neurological: She is alert and oriented to person, place, and time.  Skin: Skin is warm and dry. Capillary refill takes less than 2 seconds.  Psychiatric: She has a normal mood  and affect. Her behavior is normal.  Nursing note and vitals reviewed.    Musculoskeletal Exam: C-spine and thoracic spine and lumbar spine good range of motion. She is some stiffness with range of motion of her C-spine. Shoulder joints elbow joints wrist joint MCPs PIPs DIPs with good range of motion. She is some synovial thickening over her MCPs and PIP joints but no active synovitis was noted. Hip joints knee joints ankles MTPs PIPs are good range of motion with no active synovitis.  CDAI Exam: CDAI Homunculus Exam:   Joint Counts:  CDAI Tender Joint count: 0 CDAI Swollen Joint count: 0  Global Assessments:  Patient Global Assessment: 1 Provider Global Assessment: 1  CDAI Calculated Score: 2    Investigation: Findings:  September 2016 CCP greater than 250, RF 82, ANA negative, Sed rate 117, WBC 25.6 (patient  also had pneumonia at time of the lab draw) 04/11/2016 LDL 155 07/29/2016 Hgb 12.2, WBC6.5, AST 30, ALT 20, Creat 0.7, Albumin 4.2    Imaging: Xr Foot 2 Views Left  Result Date: 12/15/2016 Left first MTP severe narrowing was noted. Juxta articular osteopenia noted. No erosive changes noted. PIP/DIP narrowing noted. No intertarsal joint space narrowing noted. No erosive changes noted. Small calcaneal spur noted. Impression: Findings consistent with osteoarthritis  Xr Foot 2 Views Right  Result Date: 12/15/2016 PIP/DIP narrowing no significant MTP joint narrowing noted juxta articular osteopenia noted no erosive changes noted. A small calcaneal spur was noted. These findings are consistent with inflammatory arthritis and osteoarthritis overlap  Xr Hand 2 View Left  Result Date: 12/15/2016 Juxta articular osteopenia noted minimal PIP joint space narrowing noted no intercarpal joint space narrowing noted. Impression: Findings consistent with mild osteoarthritis and inflammatory arthritis  Xr Hand 2 View Right  Result Date: 12/15/2016 Right third MCP joint narrowing. All PIP joint narrowing. Juxta articular osteopenia. No intercarpal joint space narrowing. No erosive changes noted. Impression: Findings consistent with rheumatoid arthritis and osteoarthritis overlap  Xr Knee 3 View Left  Result Date: 12/15/2016 Moderate medial compartment narrowing, moderate patellofemoral narrowing no chondrocalcinosis Impression: moderate osteoarthritis and moderate chondromalacia patella  Xr Knee 3 View Right  Result Date: 12/15/2016 Moderate medial compartment narrowing, moderate patellofemoral narrowing no chondrocalcinosis Impression: moderate osteoarthritis and moderate chondromalacia patella   Speciality Comments: No specialty comments available.    Procedures:  No procedures performed Allergies: Levaquin [levofloxacin] and Cephalexin   Assessment / Plan:     Visit Diagnoses: Seropositive  rheumatoid arthritis of multiple sites ([name]) - + RF 82, anti- CCP > 250, ESR 117, ANA negative . She's been a prior patient of Dr. Charlestine [name]. She's been on Arava 20 mg by mouth daily for 1-1/2 year. She also reports intermittent use of prednisone for flares. She takes 10 mg prednisone when necessary whenever she feels increased joint pain.. Detailed discussion regarding rheumatoid arthritis and discourage the use of prednisone intermittently. 5 advised her not to take prednisone anymore and if she has a flareup would like to witness that. I will obtain some baseline chest x-rays and labs today which are described below. She had no synovitis on examination today.- Plan: XR Hand 2 View Right, XR Hand 2 View Left, XR Foot 2 Views Left, XR Foot 2 Views Right, XR KNEE 3 VIEW RIGHT, XR KNEE 3 VIEW LEFT, CBC with Differential/Platelet, COMPLETE METABOLIC PANEL WITH GFR, Urinalysis, Routine w reflex microscopic, Quantiferon tb gold assay (blood), Hepatitis A antibody, IgM, Hepatitis C Antibody, Hepatitis B Core Antibody, IgM, Hepatitis B  Surface AntiGEN, HIV antibody, IgG, IgA, IgM, Cyclic citrul peptide antibody, IgG, DG Chest 2 View  High risk medication use - Arava 20 mg po qd( 2/17)Dr Truslow  - Plan: CBC with Differential/Platelet, COMPLETE METABOLIC PANEL WITH GFR today and then every 3 months to monitor for drug toxicity. He also had discussion if she has frequent flares on Arava and then we can to combination therapy of Arava and Plaquenil. I'll schedule ultrasound of bilateral hands evaluate this further.  History of pleural effusion - 9/16.Marland Kitchen Patient was hospitalized in the past with possible pneumonia and pleural effusion. There was also suspicion of autoimmune disease related lung involvement. She has seen Dr. Lake [name] in the hospital and would like to have an appointment with him. I will obtain a baseline chest x-ray today and make a referral for her.   Bilateral hand pain - Plan: XR Hand 2 View Right, XR  Hand 2 View Left, Serum protein electrophoresis with reflex  Foot pain, bilateral - Plan: XR Foot 2 Views Left, XR Foot 2 Views Right, Serum protein electrophoresis with reflex  Chronic pain of both knees - Plan: XR KNEE 3 VIEW RIGHT, XR KNEE 3 VIEW LEFT, Serum protein electrophoresis with reflex  Immunosuppression (HCC) - Plan: CBC with Differential/Platelet, COMPLETE METABOLIC PANEL WITH GFR, Urinalysis, Routine w reflex microscopic, Quantiferon tb gold assay (blood), Hepatitis A antibody, IgM, Hepatitis C Antibody, Hepatitis B Core Antibody, IgM, Hepatitis B Surface AntiGEN, HIV antibody, IgG, IgA, IgM, DG Chest 2 View Primary osteoarthritis of both hands  History of fracture of right hip secondary to fall  Other osteoporosis without current pathological fracture - On Fosamax, 10/16 T -2.4 L radius done by GYN  History of hyperlipidemia  Paroxysmal atrial fibrillation (HCC)   Screening for HIV (human immunodeficiency virus)  Screening for tuberculosis  Need for hepatitis B screening test - Plan: Hepatitis A antibody, IgM, Hepatitis B Core Antibody, IgM, Hepatitis B Surface AntiGEN  Need for hepatitis C screening test - Plan: Hepatitis A antibody, IgM, Hepatitis C Antibody  Cough : Chest was clear to auscultation I will obtain chest x-ray today. I've advised her if she has persistent symptoms in the meantime she can see her PCP.   Orders: Orders Placed This Encounter  Procedures  . XR Hand 2 View Right  . XR Hand 2 View Left  . XR Foot 2 Views Left  . XR Foot 2 Views Right  . XR KNEE 3 VIEW RIGHT  . XR KNEE 3 VIEW LEFT  . DG Chest 2 View  . CBC with Differential/Platelet  . COMPLETE METABOLIC PANEL WITH GFR  . Urinalysis, Routine w reflex microscopic  . Quantiferon tb gold assay (blood)  . Hepatitis A antibody, IgM  . Hepatitis C Antibody  . Hepatitis B Core Antibody, IgM  . Hepatitis B Surface AntiGEN  . HIV antibody  . Serum protein electrophoresis with reflex  .  IgG, IgA, IgM  . Cyclic citrul peptide antibody, IgG  . CBC with Differential/Platelet  . COMPLETE METABOLIC PANEL WITH GFR  . Ambulatory referral to Pulmonology   No orders of the defined types were placed in this encounter.   Face-to-face time spent with patient was 60 minutes. 50% of time was spent in counseling and coordination of care.  Follow-Up Instructions: Return for Rheumatoid arthritis.   Bo Merino, MD  Note - This record has been created using Editor, commissioning.  Chart creation errors have been sought, but may not always  have been located.  Such creation errors do not reflect on  the standard of medical care.

## 2016-12-15 ENCOUNTER — Ambulatory Visit (INDEPENDENT_AMBULATORY_CARE_PROVIDER_SITE_OTHER): Payer: PPO | Admitting: Rheumatology

## 2016-12-15 ENCOUNTER — Ambulatory Visit (INDEPENDENT_AMBULATORY_CARE_PROVIDER_SITE_OTHER): Payer: PPO

## 2016-12-15 ENCOUNTER — Telehealth: Payer: Self-pay | Admitting: Rheumatology

## 2016-12-15 ENCOUNTER — Encounter: Payer: Self-pay | Admitting: Rheumatology

## 2016-12-15 ENCOUNTER — Ambulatory Visit (HOSPITAL_COMMUNITY)
Admission: RE | Admit: 2016-12-15 | Discharge: 2016-12-15 | Disposition: A | Discharge status: Home / Self Care | Payer: PPO | Source: Ambulatory Visit | Attending: Rheumatology | Admitting: Rheumatology

## 2016-12-15 VITALS — BP 106/64 | HR 78 | Resp 14 | Ht 66.5 in | Wt 130.0 lb

## 2016-12-15 DIAGNOSIS — Z111 Encounter for screening for respiratory tuberculosis: Secondary | ICD-10-CM

## 2016-12-15 DIAGNOSIS — M79672 Pain in left foot: Secondary | ICD-10-CM

## 2016-12-15 DIAGNOSIS — M0579 Rheumatoid arthritis with rheumatoid factor of multiple sites without organ or systems involvement: Secondary | ICD-10-CM

## 2016-12-15 DIAGNOSIS — Z8781 Personal history of (healed) traumatic fracture: Secondary | ICD-10-CM | POA: Diagnosis not present

## 2016-12-15 DIAGNOSIS — Z79899 Other long term (current) drug therapy: Secondary | ICD-10-CM | POA: Diagnosis not present

## 2016-12-15 DIAGNOSIS — M25561 Pain in right knee: Secondary | ICD-10-CM

## 2016-12-15 DIAGNOSIS — R059 Cough, unspecified: Secondary | ICD-10-CM

## 2016-12-15 DIAGNOSIS — M25562 Pain in left knee: Secondary | ICD-10-CM

## 2016-12-15 DIAGNOSIS — M79671 Pain in right foot: Secondary | ICD-10-CM

## 2016-12-15 DIAGNOSIS — D849 Immunodeficiency, unspecified: Secondary | ICD-10-CM

## 2016-12-15 DIAGNOSIS — M79641 Pain in right hand: Secondary | ICD-10-CM

## 2016-12-15 DIAGNOSIS — G8929 Other chronic pain: Secondary | ICD-10-CM

## 2016-12-15 DIAGNOSIS — D899 Disorder involving the immune mechanism, unspecified: Secondary | ICD-10-CM | POA: Insufficient documentation

## 2016-12-15 DIAGNOSIS — Z8639 Personal history of other endocrine, nutritional and metabolic disease: Secondary | ICD-10-CM | POA: Diagnosis not present

## 2016-12-15 DIAGNOSIS — Z8709 Personal history of other diseases of the respiratory system: Secondary | ICD-10-CM

## 2016-12-15 DIAGNOSIS — M059 Rheumatoid arthritis with rheumatoid factor, unspecified: Secondary | ICD-10-CM | POA: Insufficient documentation

## 2016-12-15 DIAGNOSIS — M79642 Pain in left hand: Secondary | ICD-10-CM

## 2016-12-15 DIAGNOSIS — M818 Other osteoporosis without current pathological fracture: Secondary | ICD-10-CM | POA: Diagnosis not present

## 2016-12-15 DIAGNOSIS — I480 Paroxysmal atrial fibrillation: Secondary | ICD-10-CM | POA: Diagnosis not present

## 2016-12-15 DIAGNOSIS — Z114 Encounter for screening for human immunodeficiency virus [HIV]: Secondary | ICD-10-CM

## 2016-12-15 DIAGNOSIS — Z1159 Encounter for screening for other viral diseases: Secondary | ICD-10-CM | POA: Diagnosis not present

## 2016-12-15 DIAGNOSIS — M19041 Primary osteoarthritis, right hand: Secondary | ICD-10-CM | POA: Diagnosis not present

## 2016-12-15 DIAGNOSIS — R05 Cough: Secondary | ICD-10-CM

## 2016-12-15 DIAGNOSIS — M19042 Primary osteoarthritis, left hand: Secondary | ICD-10-CM

## 2016-12-15 NOTE — Patient Instructions (Addendum)
Please go to [facility] radiology department to get a chest x-ray  We recommend getting a Shingrix vaccine.  Please discuss this with your primary care provider.    Standing Labs We placed an order today for your standing lab work.    Please come back and get your standing labs in September 2018 and every 3 months.  We have open lab Monday through Friday from 8:30-11:30 AM and 1:30-4 PM at the office of Dr. Tresa Moore, PA.   The office is located at [address] [place], [place], [place] [address] No appointment is necessary.   Labs are drawn by [facility].  You may receive a bill from [facility] for your lab work. If you have any questions regarding directions or hours of operation,  please call [phone].    Hydroxychloroquine tablets What is this medicine? HYDROXYCHLOROQUINE (hye drox ee KLOR oh kwin) is used to treat rheumatoid arthritis and systemic lupus erythematosus. It is also used to treat malaria. This medicine may be used for other purposes; ask your health care provider or pharmacist if you have questions. COMMON BRAND NAME(S): Plaquenil, Quineprox What should I tell my health care provider before I take this medicine? They need to know if you have any of these conditions: -diabetes -eye disease, vision problems -G6PD deficiency -history of blood diseases -history of irregular heartbeat -if you often drink alcohol -kidney disease -liver disease -porphyria -psoriasis -seizures -an unusual or allergic reaction to chloroquine, hydroxychloroquine, other medicines, foods, dyes, or preservatives -pregnant or trying to get pregnant -breast-feeding How should I use this medicine? Take this medicine by mouth with a glass of water. Follow the directions on the prescription label. Avoid taking antacids within 4 hours of taking this medicine. It is best to separate these medicines by at least 4 hours. Do not cut, crush or chew this medicine. You can take  it with or without food. If it upsets your stomach, take it with food. Take your medicine at regular intervals. Do not take your medicine more often than directed. Take all of your medicine as directed even if you think you are better. Do not skip doses or stop your medicine early. Talk to your pediatrician regarding the use of this medicine in children. While this drug may be prescribed for selected conditions, precautions do apply. Overdosage: If you think you have taken too much of this medicine contact a poison control center or emergency room at once. NOTE: This medicine is only for you. Do not share this medicine with others. What if I miss a dose? If you miss a dose, take it as soon as you can. If it is almost time for your next dose, take only that dose. Do not take double or extra doses. What may interact with this medicine? Do not take this medicine with any of the following medications: -cisapride -dofetilide -dronedarone -live virus vaccines -penicillamine -pimozide -thioridazine -ziprasidone This medicine may also interact with the following medications: -ampicillin -antacids -cimetidine -cyclosporine -digoxin -medicines for diabetes, like insulin, glipizide, glyburide -medicines for seizures like carbamazepine, phenobarbital, phenytoin -mefloquine -methotrexate -other medicines that prolong the QT interval (cause an abnormal heart rhythm) -praziquantel This list may not describe all possible interactions. Give your health care provider a list of all the medicines, herbs, non-prescription drugs, or dietary supplements you use. Also tell them if you smoke, drink alcohol, or use illegal drugs. Some items may interact with your medicine. What should I watch for while using this medicine? Tell your  doctor or healthcare professional if your symptoms do not start to get better or if they get worse. Avoid taking antacids within 4 hours of taking this medicine. It is best to  separate these medicines by at least 4 hours. Tell your doctor or health care professional right away if you have any change in your eyesight. Your vision and blood may be tested before and during use of this medicine. This medicine can make you more sensitive to the sun. Keep out of the sun. If you cannot avoid being in the sun, wear protective clothing and use sunscreen. Do not use sun lamps or tanning beds/booths. What side effects may I notice from receiving this medicine? Side effects that you should report to your doctor or health care professional as soon as possible: -allergic reactions like skin rash, itching or hives, swelling of the face, lips, or tongue -changes in vision -decreased hearing or ringing of the ears -redness, blistering, peeling or loosening of the skin, including inside the mouth -seizures -sensitivity to light -signs and symptoms of a dangerous change in heartbeat or heart rhythm like chest pain; dizziness; fast or irregular heartbeat; palpitations; feeling faint or lightheaded, falls; breathing problems -signs and symptoms of liver injury like dark yellow or brown urine; general ill feeling or flu-like symptoms; light-colored stools; loss of appetite; nausea; right upper belly pain; unusually weak or tired; yellowing of the eyes or skin -signs and symptoms of low blood sugar such as feeling anxious; confusion; dizziness; increased hunger; unusually weak or tired; sweating; shakiness; cold; irritable; headache; blurred vision; fast heartbeat; loss of consciousness -uncontrollable head, mouth, neck, arm, or leg movements Side effects that usually do not require medical attention (report to your doctor or health care professional if they continue or are bothersome): -anxious -diarrhea -dizziness -hair loss -headache -irritable -loss of appetite -nausea, vomiting -stomach pain This list may not describe all possible side effects. Call your doctor for medical advice  about side effects. You may report side effects to FDA at 1-800-FDA-1088. Where should I keep my medicine? Keep out of the reach of children. In children, this medicine can cause overdose with small doses. Store at room temperature between 15 and 30 degrees C (59 and 86 degrees F). Protect from moisture and light. Throw away any unused medicine after the expiration date. NOTE: This sheet is a summary. It may not cover all possible information. If you have questions about this medicine, talk to your doctor, pharmacist, or health care provider.  2018 Elsevier/Gold Standard (2016-02-13 14:16:15) Leflunomide tablets What is this medicine? LEFLUNOMIDE (le FLOO na mide) is for rheumatoid arthritis. This medicine may be used for other purposes; ask your health care provider or pharmacist if you have questions. COMMON BRAND NAME(S): Arava What should I tell my health care provider before I take this medicine? They need to know if you have any of these conditions: -alcoholism -bone marrow problems -fever or infection -immune system problems -kidney disease -liver disease -an unusual or allergic reaction to leflunomide, teriflunomide, other medicines, lactose, foods, dyes, or preservatives -pregnant or trying to get pregnant -breast-feeding How should I use this medicine? Take this medicine by mouth with a full glass of water. Follow the directions on the prescription label. Take your medicine at regular intervals. Do not take your medicine more often than directed. Do not stop taking except on your doctor's advice. Talk to your pediatrician regarding the use of this medicine in children. Special care may be needed. Overdosage: If you  think you have taken too much of this medicine contact a poison control center or emergency room at once. NOTE: This medicine is only for you. Do not share this medicine with others. What if I miss a dose? If you miss a dose, take it as soon as you can. If it is  almost time for your next dose, take only that dose. Do not take double or extra doses. What may interact with this medicine? Do not take this medicine with any of the following medications: -teriflunomide This medicine may also interact with the following medications: -charcoal -cholestyramine -methotrexate -NSAIDs, medicines for pain and inflammation, like ibuprofen or naproxen -phenytoin -rifampin -tolbutamide -vaccines -warfarin This list may not describe all possible interactions. Give your health care provider a list of all the medicines, herbs, non-prescription drugs, or dietary supplements you use. Also tell them if you smoke, drink alcohol, or use illegal drugs. Some items may interact with your medicine. What should I watch for while using this medicine? Visit your doctor or health care professional for regular checks on your progress. You will need frequent blood checks while you are receiving the medicine. If you get a cold or other infection while receiving this medicine, call your doctor or health care professional. Do not treat yourself. The medicine may increase your risk of getting an infection. If you are a woman who has the potential to become pregnant, discuss birth control options with your doctor or health care professional. Dennis Bast must not be pregnant, and you must be using a reliable form of birth control. The medicine may harm an unborn baby. Immediately call your doctor if you think you might be pregnant. Alcoholic drinks may increase possible damage to your liver. Do not drink alcohol while taking this medicine. What side effects may I notice from receiving this medicine? Side effects that you should report to your doctor or health care professional as soon as possible: -allergic reactions like skin rash, itching or hives, swelling of the face, lips, or tongue -cough -difficulty breathing or shortness of breath -fever, chills or any other sign of infection -redness,  blistering, peeling or loosening of the skin, including inside the mouth -unusual bleeding or bruising -unusually weak or tired -vomiting -yellowing of eyes or skin Side effects that usually do not require medical attention (report to your doctor or health care professional if they continue or are bothersome): -diarrhea -hair loss -headache -nausea This list may not describe all possible side effects. Call your doctor for medical advice about side effects. You may report side effects to FDA at 1-800-FDA-1088. Where should I keep my medicine? Keep out of the reach of children. Store at room temperature between 15 and 30 degrees C (59 and 86 degrees F). Protect from moisture and light. Throw away any unused medicine after the expiration date. NOTE: This sheet is a summary. It may not cover all possible information. If you have questions about this medicine, talk to your doctor, pharmacist, or health care provider.  2018 Elsevier/Gold Standard (2013-06-28 10:53:11)

## 2016-12-15 NOTE — Anesthesia Postprocedure Evaluation (Signed)
Anesthesia Post Note  Patient: [name] [name]  Procedure(s) Performed: Procedure(s) (LRB): COLONOSCOPY WITH PROPOFOL (N/A)     Anesthesia Post Evaluation  Last Vitals:  Vitals:   11/04/16 0945 11/04/16 0955  BP: (!) 129/55 (!) 126/58  Pulse: 62 (!) 58  Resp: 13 13  Temp:      Last Pain:  Vitals:   11/04/16 0930  TempSrc: Oral                 Johneric Mcfadden S

## 2016-12-15 NOTE — Addendum Note (Signed)
Addendum  created 12/15/16 1341 by Kailee Essman, MD   Sign clinical note    

## 2016-12-15 NOTE — Progress Notes (Signed)
Pharmacy Note  Subjective: Patient presents today to the [facility] to see Dr. Estanislado Pandy.  Patient is currently taking leflunomide 20 mg daily prescribed by previous rheumatologist.  Patient seen by the pharmacist for counseling on leflunomide Jolee Ewing).    Objective: CBC, CMP ordered today TB Gold: ordered today  Pregnancy status:  Post-menopause  Vitals:   12/15/16 0845 12/15/16 0849  BP: 108/62 106/64  Pulse: 78   Resp: 14    Assessment/Plan: Patient was counseled on the purpose, proper use, and adverse effects of leflunomide including risk of infection, nausea/diarrhea/weight loss, increase in blood pressure, rash, hair loss, tingling in the hands and feet, and signs and symptoms of interstitial lung disease.  Discussed the importance of frequent monitoring of liver function and blood counts, and patient was provided with instructions for standing labs.  Provided patient with educational materials on leflunomide and answered all questions.  Patient consented to [place] use, and consent will be uploaded into the media tab.    Elisabeth Most, Pharm.D., BCPS Clinical Pharmacist Pager: [phone] Phone: [phone] 12/15/2016 10:04 AM

## 2016-12-16 DIAGNOSIS — R509 Fever, unspecified: Secondary | ICD-10-CM | POA: Diagnosis not present

## 2016-12-16 DIAGNOSIS — M0500 Felty's syndrome, unspecified site: Secondary | ICD-10-CM | POA: Diagnosis not present

## 2016-12-16 DIAGNOSIS — M069 Rheumatoid arthritis, unspecified: Secondary | ICD-10-CM | POA: Diagnosis not present

## 2016-12-16 DIAGNOSIS — R05 Cough: Secondary | ICD-10-CM | POA: Diagnosis not present

## 2016-12-16 LAB — HIV ANTIBODY (ROUTINE TESTING W REFLEX): HIV: NONREACTIVE

## 2016-12-16 LAB — URINALYSIS, ROUTINE W REFLEX MICROSCOPIC
BILIRUBIN URINE: NEGATIVE
Glucose, UA: NEGATIVE
Hgb urine dipstick: NEGATIVE
KETONES UR: NEGATIVE
Leukocytes, UA: NEGATIVE
Nitrite: NEGATIVE
PH: 7 (ref 5.0–8.0)
Protein, ur: NEGATIVE
SPECIFIC GRAVITY, URINE: 1.015 (ref 1.001–1.035)

## 2016-12-16 LAB — IGG, IGA, IGM
IgA: 396 mg/dL (ref 81–463)
IgG (Immunoglobin G), Serum: 849 mg/dL (ref 694–1618)
IgM, Serum: 46 mg/dL — ABNORMAL LOW (ref 48–271)

## 2016-12-16 LAB — CYCLIC CITRUL PEPTIDE ANTIBODY, IGG

## 2016-12-16 NOTE — Progress Notes (Signed)
WNL

## 2016-12-17 LAB — PROTEIN ELECTROPHORESIS, SERUM, WITH REFLEX
Albumin ELP: 4.2 g/dL (ref 3.8–4.8)
Alpha-1-Globulin: 0.5 g/dL — ABNORMAL HIGH (ref 0.2–0.3)
Alpha-2-Globulin: 0.7 g/dL (ref 0.5–0.9)
BETA 2: 0.5 g/dL (ref 0.2–0.5)
BETA GLOBULIN: 0.4 g/dL (ref 0.4–0.6)
GAMMA GLOBULIN: 0.8 g/dL (ref 0.8–1.7)
Total Protein, Serum Electrophoresis: 7.1 g/dL (ref 6.1–8.1)

## 2016-12-17 LAB — QUANTIFERON TB GOLD ASSAY (BLOOD)
Interferon Gamma Release Assay: NEGATIVE
Mitogen-Nil: >10 IU/mL
Quantiferon Nil Value: 3.84 IU/mL
Quantiferon Tb Ag Minus Nil Value: 0.81 IU/mL

## 2016-12-18 LAB — IFE INTERPRETATION

## 2016-12-21 LAB — HEPATITIS B SURFACE ANTIGEN: HEP B S AG: NEGATIVE

## 2016-12-21 LAB — HEPATITIS C ANTIBODY: HCV AB: NEGATIVE

## 2016-12-21 LAB — HEPATITIS A ANTIBODY, IGM: HEP A IGM: NONREACTIVE

## 2016-12-21 LAB — HEPATITIS B CORE ANTIBODY, IGM: Hep B C IgM: NONREACTIVE

## 2016-12-23 NOTE — Progress Notes (Signed)
Labs cw RA, continue current tt

## 2017-01-05 NOTE — Telephone Encounter (Signed)
Opened in error

## 2017-01-06 ENCOUNTER — Telehealth: Payer: Self-pay | Admitting: Internal Medicine

## 2017-01-06 DIAGNOSIS — D485 Neoplasm of uncertain behavior of skin: Secondary | ICD-10-CM | POA: Diagnosis not present

## 2017-01-06 DIAGNOSIS — L570 Actinic keratosis: Secondary | ICD-10-CM | POA: Diagnosis not present

## 2017-01-06 DIAGNOSIS — L309 Dermatitis, unspecified: Secondary | ICD-10-CM | POA: Diagnosis not present

## 2017-01-06 NOTE — Telephone Encounter (Signed)
OK, 30 min only 

## 2017-01-06 NOTE — Telephone Encounter (Signed)
BQ please advise if you are ok with seeing this pt.  Pt wanted to change from [facility] to another provider. Thanks

## 2017-01-06 NOTE — Telephone Encounter (Signed)
Pt aware of switch.  Pt has already been scheduled for a rov- 30 minute OV with BQ.  Nothing further needed.

## 2017-01-06 NOTE — Telephone Encounter (Signed)
Fine with me

## 2017-01-06 NOTE — Telephone Encounter (Signed)
Called and spoke to pt. Pt is requesting to change providers from MW to BQ. Tentative appt made with BQ on 04/28/17. If both providers are ok with change, will need to call pt back to confirm appt.   Dr. Melvyn Novas please advise if you are ok with switch. Thanks.  Dr. Lake Bells please advise if you are ok taking on new pt. Thanks.

## 2017-01-08 ENCOUNTER — Encounter: Payer: Self-pay | Admitting: Rheumatology

## 2017-01-08 ENCOUNTER — Ambulatory Visit (INDEPENDENT_AMBULATORY_CARE_PROVIDER_SITE_OTHER): Payer: PPO | Admitting: Rheumatology

## 2017-01-08 VITALS — BP 118/62 | HR 78 | Resp 14 | Ht 66.5 in | Wt 130.0 lb

## 2017-01-08 DIAGNOSIS — I480 Paroxysmal atrial fibrillation: Secondary | ICD-10-CM

## 2017-01-08 DIAGNOSIS — M19042 Primary osteoarthritis, left hand: Secondary | ICD-10-CM

## 2017-01-08 DIAGNOSIS — M069 Rheumatoid arthritis, unspecified: Secondary | ICD-10-CM

## 2017-01-08 DIAGNOSIS — Z8639 Personal history of other endocrine, nutritional and metabolic disease: Secondary | ICD-10-CM

## 2017-01-08 DIAGNOSIS — M0579 Rheumatoid arthritis with rheumatoid factor of multiple sites without organ or systems involvement: Secondary | ICD-10-CM | POA: Diagnosis not present

## 2017-01-08 DIAGNOSIS — Z8709 Personal history of other diseases of the respiratory system: Secondary | ICD-10-CM

## 2017-01-08 DIAGNOSIS — M19071 Primary osteoarthritis, right ankle and foot: Secondary | ICD-10-CM | POA: Diagnosis not present

## 2017-01-08 DIAGNOSIS — Z8701 Personal history of pneumonia (recurrent): Secondary | ICD-10-CM | POA: Diagnosis not present

## 2017-01-08 DIAGNOSIS — M170 Bilateral primary osteoarthritis of knee: Secondary | ICD-10-CM | POA: Insufficient documentation

## 2017-01-08 DIAGNOSIS — Z79899 Other long term (current) drug therapy: Secondary | ICD-10-CM

## 2017-01-08 DIAGNOSIS — Z8781 Personal history of (healed) traumatic fracture: Secondary | ICD-10-CM

## 2017-01-08 DIAGNOSIS — E782 Mixed hyperlipidemia: Secondary | ICD-10-CM | POA: Diagnosis not present

## 2017-01-08 DIAGNOSIS — M19041 Primary osteoarthritis, right hand: Secondary | ICD-10-CM | POA: Diagnosis not present

## 2017-01-08 DIAGNOSIS — D899 Disorder involving the immune mechanism, unspecified: Secondary | ICD-10-CM | POA: Diagnosis not present

## 2017-01-08 DIAGNOSIS — M19072 Primary osteoarthritis, left ankle and foot: Secondary | ICD-10-CM

## 2017-01-08 DIAGNOSIS — D849 Immunodeficiency, unspecified: Secondary | ICD-10-CM

## 2017-01-08 NOTE — Progress Notes (Signed)
Pharmacy Note  Subjective: Patient presents today to the [facility] to see Dr. Estanislado Pandy.  Patient is currently taking leflunomide 20 mg daily.  She is in need of CBC and CMP today.  Decision was made to start patient on hydroxychloroquine.  Patient seen by the pharmacist for counseling on hydroxychloroquine.    Objective: CBC, CMP ordered today  Assessment/Plan: Patient was prescribed hydroxychloroquine 200 mg BID Monday through Friday.  Patient was counseled on the purpose, proper use, and adverse effects of hydroxychloroquine including nausea/diarrhea, skin rash, headaches, and sun sensitivity.  Discussed importance of annual eye exams while on hydroxychloroquine to monitor to ocular toxicity and discussed importance of frequent laboratory monitoring.  Provided patient with eye exam form for baseline ophthalmologic exam and standing lab instructions.  Provided patient with educational materials on hydroxychloroquine and answered all questions.  Patient consented to hydroxychloroquine.  Will upload consent in the media tab.    Elisabeth Most, Pharm.D., BCPS Clinical Pharmacist Pager: [phone] Phone: [phone] 01/08/2017 3:51 PM

## 2017-01-08 NOTE — Patient Instructions (Addendum)
Association of heart disease with rheumatoid arthritis was discussed. Need to monitor blood pressure, cholesterol, and to exercise 30-60 minutes on daily basis was discussed. Poor dental hygiene can be a predisposing factor for rheumatoid arthritis. Good dental hygiene was discussed.  Standing Labs We placed an order today for your standing lab work.    Please come back and get your standing labs in July and every 3 months  We have open lab Monday through Friday from 8:30-11:30 AM and 1:30-4 PM at the office of Dr. Tresa Moore, PA.   The office is located at [address] [place], [place], [place] [address] No appointment is necessary.   Labs are drawn by [facility].  You may receive a bill from [facility] for your lab work. If you have any questions regarding directions or hours of operation,  please call [phone].    Natural anti-inflammatories  You can purchase these at [employer], [employer] or online.  . Turmeric (capsules)  . Ginger (ginger root or capsules)  . Omega 3 (Fish, flax seeds, chia seeds, walnuts, almonds)  . Tart cherry (dried or extract)   Patient should be under the care of a physician while taking these supplements. This may not be reproduced without the permission of Dr. Bo Merino.  Hydroxychloroquine tablets What is this medicine? HYDROXYCHLOROQUINE (hye drox ee KLOR oh kwin) is used to treat rheumatoid arthritis and systemic lupus erythematosus. It is also used to treat malaria. This medicine may be used for other purposes; ask your health care provider or pharmacist if you have questions. COMMON BRAND NAME(S): Plaquenil, Quineprox What should I tell my health care provider before I take this medicine? They need to know if you have any of these conditions: -diabetes -eye disease, vision problems -G6PD deficiency -history of blood diseases -history of irregular heartbeat -if you often drink alcohol -kidney  disease -liver disease -porphyria -psoriasis -seizures -an unusual or allergic reaction to chloroquine, hydroxychloroquine, other medicines, foods, dyes, or preservatives -pregnant or trying to get pregnant -breast-feeding How should I use this medicine? Take this medicine by mouth with a glass of water. Follow the directions on the prescription label. Avoid taking antacids within 4 hours of taking this medicine. It is best to separate these medicines by at least 4 hours. Do not cut, crush or chew this medicine. You can take it with or without food. If it upsets your stomach, take it with food. Take your medicine at regular intervals. Do not take your medicine more often than directed. Take all of your medicine as directed even if you think you are better. Do not skip doses or stop your medicine early. Talk to your pediatrician regarding the use of this medicine in children. While this drug may be prescribed for selected conditions, precautions do apply. Overdosage: If you think you have taken too much of this medicine contact a poison control center or emergency room at once. NOTE: This medicine is only for you. Do not share this medicine with others. What if I miss a dose? If you miss a dose, take it as soon as you can. If it is almost time for your next dose, take only that dose. Do not take double or extra doses. What may interact with this medicine? Do not take this medicine with any of the following medications: -cisapride -dofetilide -dronedarone -live virus vaccines -penicillamine -pimozide -thioridazine -ziprasidone This medicine may also interact with the following medications: -ampicillin -antacids -cimetidine -cyclosporine -digoxin -medicines for diabetes, like insulin, glipizide,  glyburide -medicines for seizures like carbamazepine, phenobarbital, phenytoin -mefloquine -methotrexate -other medicines that prolong the QT interval (cause an abnormal heart  rhythm) -praziquantel This list may not describe all possible interactions. Give your health care provider a list of all the medicines, herbs, non-prescription drugs, or dietary supplements you use. Also tell them if you smoke, drink alcohol, or use illegal drugs. Some items may interact with your medicine. What should I watch for while using this medicine? Tell your doctor or healthcare professional if your symptoms do not start to get better or if they get worse. Avoid taking antacids within 4 hours of taking this medicine. It is best to separate these medicines by at least 4 hours. Tell your doctor or health care professional right away if you have any change in your eyesight. Your vision and blood may be tested before and during use of this medicine. This medicine can make you more sensitive to the sun. Keep out of the sun. If you cannot avoid being in the sun, wear protective clothing and use sunscreen. Do not use sun lamps or tanning beds/booths. What side effects may I notice from receiving this medicine? Side effects that you should report to your doctor or health care professional as soon as possible: -allergic reactions like skin rash, itching or hives, swelling of the face, lips, or tongue -changes in vision -decreased hearing or ringing of the ears -redness, blistering, peeling or loosening of the skin, including inside the mouth -seizures -sensitivity to light -signs and symptoms of a dangerous change in heartbeat or heart rhythm like chest pain; dizziness; fast or irregular heartbeat; palpitations; feeling faint or lightheaded, falls; breathing problems -signs and symptoms of liver injury like dark yellow or brown urine; general ill feeling or flu-like symptoms; light-colored stools; loss of appetite; nausea; right upper belly pain; unusually weak or tired; yellowing of the eyes or skin -signs and symptoms of low blood sugar such as feeling anxious; confusion; dizziness; increased  hunger; unusually weak or tired; sweating; shakiness; cold; irritable; headache; blurred vision; fast heartbeat; loss of consciousness -uncontrollable head, mouth, neck, arm, or leg movements Side effects that usually do not require medical attention (report to your doctor or health care professional if they continue or are bothersome): -anxious -diarrhea -dizziness -hair loss -headache -irritable -loss of appetite -nausea, vomiting -stomach pain This list may not describe all possible side effects. Call your doctor for medical advice about side effects. You may report side effects to FDA at 1-800-FDA-1088. Where should I keep my medicine? Keep out of the reach of children. In children, this medicine can cause overdose with small doses. Store at room temperature between 15 and 30 degrees C (59 and 86 degrees F). Protect from moisture and light. Throw away any unused medicine after the expiration date. NOTE: This sheet is a summary. It may not cover all possible information. If you have questions about this medicine, talk to your doctor, pharmacist, or health care provider.  2018 Elsevier/Gold Standard (2016-02-13 14:16:15)

## 2017-01-08 NOTE — Progress Notes (Addendum)
Office Visit Note  Patient: [name] [name]             Date of Birth: Sep 03, 1950           MRN: [id number]             PCP: Levin Erp, MD Referring: Levin Erp, MD Visit Date: 01/08/2017 Occupation: '@GUAROCC' @    Subjective:  Pain hands   History of Present Illness: [name] [name] is a 66 y.o. female with history of sero positive rheumatoid arthritis. She states she had 2 flares since the last visit. She had a new grandchild and she was taking care of the grandchild and that flared her arthritis. She states she had pain and swelling in her left hand she decided not to take prednisone this time. But by the fourth day she states she has persistent swelling and she took 1 dose of 10 mg of prednisone. 2 weeks ago she had another episode with right elbow joint pain and discomfort. Which has subsided. She does not have any joint pain currently. patient states that she had low-grade fever and a cough without any congestion in the last week. She was given Z-Pak by her PCP and the symptoms got better. She still have some dry cough. She has a schedule of follow-up appointment with Dr. Lake Bells.  Activities of Daily Living:  Patient reports morning stiffness for  minute.   Patient Denies nocturnal pain.  Difficulty dressing/grooming: Denies Difficulty climbing stairs: Denies Difficulty getting out of chair: Denies Difficulty using hands for taps, buttons, cutlery, and/or writing: Denies   Review of Systems  Constitutional: Negative for fatigue, night sweats, weight gain, weight loss and weakness.  HENT: Negative for mouth sores, trouble swallowing, trouble swallowing, mouth dryness and nose dryness.   Eyes: Negative for pain, redness, visual disturbance and dryness.  Respiratory: Positive for cough. Negative for shortness of breath and difficulty breathing.   Cardiovascular: Negative for chest pain, palpitations, hypertension, irregular heartbeat and swelling in legs/feet.  Gastrointestinal:  Negative for blood in stool, constipation and diarrhea.  Endocrine: Negative for increased urination.  Genitourinary: Negative for vaginal dryness.  Musculoskeletal: Negative for joint swelling, myalgias, muscle weakness, morning stiffness, muscle tenderness and myalgias.  Skin: Negative for color change, rash, hair loss, skin tightness, ulcers and sensitivity to sunlight.  Allergic/Immunologic: Negative for susceptible to infections.  Neurological: Negative for dizziness, memory loss and night sweats.  Hematological: Negative for swollen glands.  Psychiatric/Behavioral: Positive for depressed mood. Negative for sleep disturbance. The patient is not nervous/anxious.     PMFS History:  Patient Active Problem List   Diagnosis Date Noted  . Primary osteoarthritis of both knees 01/08/2017  . Primary osteoarthritis of both feet 01/08/2017  . History of pleural effusion 12/01/2016  . History of recurrent pneumonia 12/01/2016  . History of hyperlipidemia 12/01/2016  . High risk medication use 12/01/2016  . Trigger thumb, left thumb 12/01/2016  . Elevated sed rate 12/01/2016  . History of fracture of right hip 12/01/2016  . Primary osteoarthritis of both hands 12/01/2016  . Rheumatoid arthritis involving multiple joints ([place]) 04/09/2015  . HCAP (healthcare-associated pneumonia)   . Paroxysmal atrial fibrillation (HCC)   . Tendonitis 04/03/2015  . Pleural effusion associated with pulmonary infection 03/27/2015  . Orthostatic syncope   . Syncope 03/18/2015  . Osteoporosis   . Hyperlipidemia 11/14/2013    Past Medical History:  Diagnosis Date  . Elevated cholesterol   . Migraines    occular  . Osteoporosis 2015  based on hip fracture from fall  DEXA 04/2015 T score -2.4 left forearm, stable at hip and spine from prior DEXA.  Marland Kitchen Pleural effusion 9/206   Bilateral effusions, s/p thora on R c/w exudate  . Pneumonia 03/2015   R  . RA (rheumatoid arthritis) ([place])   . Skin cancer  (melanoma) (HCC)    Right Arm, nose.     Family History  Problem Relation Age of Onset  . Heart attack Father 30  . Heart disease Father    Past Surgical History:  Procedure Laterality Date  . COLONOSCOPY WITH PROPOFOL N/A 11/04/2016   Procedure: COLONOSCOPY WITH PROPOFOL;  Surgeon: Garlan Fair, MD;  Location: [facility];  Service: Endoscopy;  Laterality: N/A;  . FINGER SURGERY    . HIP ARTHROPLASTY Right 11/14/2013   Procedure: RIGHT CANNULATED HIP PINNING;  Surgeon: Alta Corning, MD;  Location: [facility];  Service: Orthopedics;  Laterality: Right;  BIOMET  . 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 She appears well-developed and well-nourished.  HENT:  Head: Normocephalic and atraumatic.  Eyes: Conjunctivae and EOM are normal.  Neck: Normal range of motion.  Cardiovascular: Normal rate, regular rhythm, normal heart sounds and intact distal pulses.   Pulmonary/Chest: Effort normal and breath sounds normal.  Abdominal: Soft. Bowel sounds are normal.  Lymphadenopathy:    She has no cervical adenopathy.  Neurological: She is alert and oriented to person, place, and time.  Skin: Skin is warm and dry. Capillary refill takes less than 2 seconds.  Psychiatric: She has a normal mood and affect. Her behavior is normal.  Nursing note and vitals reviewed.    Musculoskeletal Exam: C-spine and thoracic lumbar spine good range of  motion. Shoulder joints elbow joints wrist joints, MCPs, PIPs, DIPs with good range of motion with no synovitis. She is some osteoarthritic changes in her hands but DIP PIP thickening. Hip joints knee joints ankles MTPs PIPs DIPs with good range of motion. She also has some DIP thickening in her feet consistent with osteoarthritis.  CDAI Exam: CDAI Homunculus Exam:   Joint Counts:  CDAI Tender Joint count: 0 CDAI Swollen Joint count: 0  Global Assessments:  Patient Global Assessment: 2 Provider Global Assessment: 2  CDAI Calculated Score: 4    Investigation: No additional findings. 12/15/2016 UA negative, IFE negative, TB gold negative, immunoglobulins normal IgM low at 46, hepatitis panel negative, HIV negative, anti-CCP greater than 250  Imaging: Dg Chest 2 View  Result Date: 12/16/2016 CLINICAL DATA:  Seropositive rheumatoid arthritis of multiple sites. Immunosuppression screening for tuberculosis. EXAM: CHEST  2 VIEW COMPARISON:  07/02/2015 FINDINGS: The heart size and mediastinal contours are within normal limits. Both lungs are clear. The visualized skeletal structures are unremarkable. - IMPRESSION: No active cardiopulmonary disease. Electronically Signed   By: Lorriane Shire M.D.  On: 12/16/2016 08:40   Xr Foot 2 Views Left  Result Date: 12/15/2016 Left first MTP severe narrowing was noted. Juxta articular osteopenia noted. No erosive changes noted. PIP/DIP narrowing noted. No intertarsal joint space narrowing noted. No erosive changes noted. Small calcaneal spur noted. Impression: Findings consistent with osteoarthritis  Xr Foot 2 Views Right  Result Date: 12/15/2016 PIP/DIP narrowing no significant MTP joint narrowing noted juxta articular osteopenia noted no erosive changes noted. A small calcaneal spur was noted. These findings are consistent with inflammatory arthritis and osteoarthritis overlap  Xr Hand 2 View Left  Result Date: 12/15/2016 Juxta articular osteopenia  noted minimal PIP joint space narrowing noted no intercarpal joint space narrowing noted. Impression: Findings consistent with mild osteoarthritis and inflammatory arthritis  Xr Hand 2 View Right  Result Date: 12/15/2016 Right third MCP joint narrowing. All PIP joint narrowing. Juxta articular osteopenia. No intercarpal joint space narrowing. No erosive changes noted. Impression: Findings consistent with rheumatoid arthritis and osteoarthritis overlap  Xr Knee 3 View Left  Result Date: 12/15/2016 Moderate medial compartment narrowing, moderate patellofemoral narrowing no chondrocalcinosis Impression: moderate osteoarthritis and moderate chondromalacia patella  Xr Knee 3 View Right  Result Date: 12/15/2016 Moderate medial compartment narrowing, moderate patellofemoral narrowing no chondrocalcinosis Impression: moderate osteoarthritis and moderate chondromalacia patella   Speciality Comments: No specialty comments available.    Procedures:  No procedures performed Allergies: Levaquin [levofloxacin] and Cephalexin   Assessment / Plan:     Visit Diagnoses: Rheumatoid arthritis involving multiple joints (HCC) - Positive RF, positive anti-CCP history of elevated ESR ANA negative. She is clinically doing well without any synovitis on examination today. Although she gives history of 2 episodes of rheumatoid arthritis flare. She states she took prednisone 10 mg only for 1 day for one flare. We had detailed discussion regarding adding Plaquenil last visit.. She has done some reading on Plaquenil in his right to start on the medication. Indications side effects contraindications were discussed. Handout was given consent was taken. The plan is to start her on Plaquenil. I'll call and Plaquenil 200 mg by mouth twice a day Monday to Friday total 90 days supply will be given. She will need lab work in a month then every 3 months to monitor for drug toxicity. She will also need eye exam form. She has a schedule  an appointment with her ophthalmologist.  High risk medication use - Arava 20 mg by mouth daily (February 2017)We will check her labs today  Primary osteoarthritis of both hands: Joint protection and muscle strengthening discussed.  Primary osteoarthritis of both knees - Bilateral moderate with moderate chondromalacia patella  Primary osteoarthritis of both feet: Proper fitting shoes were discussed.  History of fracture of right hip: Doing well  History of pleural effusion of unknown etiology. She does have appointment coming up with Dr. Lake Bells.  History of hyperlipidemia: Low-fat diet and exercise was discussed. There is positive family history of heart disease.  History of recurrent pneumonia  Mixed hyperlipidemia  Paroxysmal atrial fibrillation (HCC)   Association of heart disease with rheumatoid arthritis was discussed. Need to monitor blood pressure, cholesterol, and to exercise 30-60 minutes on daily basis was discussed. Poor dental hygiene can be a predisposing factor for rheumatoid arthritis. Good dental hygiene was discussed.  Orders: Orders Placed This Encounter  Procedures  . Glucose 6 phosphate dehydrogenase   No orders of the defined types were placed in this encounter.   Face-to-face time spent with patient was 30 minutes. 50% of time  was spent in counseling and coordination of care.  Follow-Up Instructions: Return in about 5 months (around 06/10/2017) for Rheumatoid arthritis, Osteoarthritis.   Bo Merino, MD  Note - This record has been created using Editor, commissioning.  Chart creation errors have been sought, but may not always  have been located. Such creation errors do not reflect on  the standard of medical care.

## 2017-01-09 LAB — COMPLETE METABOLIC PANEL WITH GFR
ALBUMIN: 4.2 g/dL (ref 3.6–5.1)
ALK PHOS: 123 U/L (ref 33–130)
ALT: 17 U/L (ref 6–29)
AST: 32 U/L (ref 10–35)
BUN: 16 mg/dL (ref 7–25)
CALCIUM: 9.6 mg/dL (ref 8.6–10.4)
CO2: 26 mmol/L (ref 20–31)
CREATININE: 0.8 mg/dL (ref 0.50–0.99)
Chloride: 102 mmol/L (ref 98–110)
GFR, Est African American: 88 mL/min (ref >=60)
GFR, Est Non African American: 77 mL/min (ref >=60)
GLUCOSE: 92 mg/dL (ref 65–99)
POTASSIUM: 4.1 mmol/L (ref 3.5–5.3)
SODIUM: 140 mmol/L (ref 135–146)
TOTAL PROTEIN: 6.8 g/dL (ref 6.1–8.1)
Total Bilirubin: 0.5 mg/dL (ref 0.2–1.2)

## 2017-01-09 LAB — CBC WITH DIFFERENTIAL/PLATELET
BASOS ABS: 64 {cells}/uL (ref 0–200)
Basophils Relative: 1 %
EOS PCT: 3 %
Eosinophils Absolute: 192 cells/uL (ref 15–500)
HEMATOCRIT: 40.1 % (ref 35.0–45.0)
HEMOGLOBIN: 12.6 g/dL (ref 11.7–15.5)
LYMPHS ABS: 2752 {cells}/uL (ref 850–3900)
Lymphocytes Relative: 43 %
MCH: 31.2 pg (ref 27.0–33.0)
MCHC: 31.4 g/dL — AB (ref 32.0–36.0)
MCV: 99.3 fL (ref 80.0–100.0)
MONO ABS: 640 {cells}/uL (ref 200–950)
MPV: 9.8 fL (ref 7.5–12.5)
Monocytes Relative: 10 %
NEUTROS ABS: 2752 {cells}/uL (ref 1500–7800)
NEUTROS PCT: 43 %
Platelets: 320 10*3/uL (ref 140–400)
RBC: 4.04 MIL/uL (ref 3.80–5.10)
RDW: 13.4 % (ref 11.0–15.0)
WBC: 6.4 10*3/uL (ref 3.8–10.8)

## 2017-01-09 LAB — GLUCOSE 6 PHOSPHATE DEHYDROGENASE: G-6PDH: 17.9 U/g{Hb} (ref 7.0–20.5)

## 2017-01-09 NOTE — Progress Notes (Signed)
wnl

## 2017-01-12 ENCOUNTER — Ambulatory Visit: Payer: PPO | Admitting: Rheumatology

## 2017-01-13 DIAGNOSIS — J189 Pneumonia, unspecified organism: Secondary | ICD-10-CM | POA: Diagnosis not present

## 2017-01-13 DIAGNOSIS — M0500 Felty's syndrome, unspecified site: Secondary | ICD-10-CM | POA: Diagnosis not present

## 2017-01-16 ENCOUNTER — Telehealth: Payer: Self-pay | Admitting: *Deleted

## 2017-01-16 DIAGNOSIS — M069 Rheumatoid arthritis, unspecified: Secondary | ICD-10-CM | POA: Diagnosis not present

## 2017-01-16 NOTE — Telephone Encounter (Signed)
-----   Message from Cyndia Skeeters, [facility] sent at 01/09/2017  1:19 PM EDT ----- Once patient's labs return:  The plan is to start her on Plaquenil. I'll call and Plaquenil 200 mg by mouth twice a day Monday to Friday total 90 days supply will be given. She will need lab work in a month then every 3 months to monitor for drug toxicity.

## 2017-01-16 NOTE — Telephone Encounter (Signed)
Patient advised of lab results and state she has had 2 episodes of a dry cough and then working outside and overworking herself in the heat and developing a fever. Patient states she has spoke with her PCP about it and she did not have an infection at the time of the episodes and she and the PCP are wondering if if could possibly be an RA flare involved with her lungs. Patient is going to see a pulmonologist. Patient would like to know if the PLQ is the best medication if she is having lung involvement. Patient has decided to hold on starting the PLQ until hearing back .

## 2017-01-19 MED ORDER — HYDROXYCHLOROQUINE SULFATE 200 MG PO TABS: ORAL_TABLET | ORAL | 2 refills | Status: DC

## 2017-01-19 NOTE — Telephone Encounter (Signed)
Okay to start Plaquenil for right now. If Dr. Lake Bells suggests any other treatment then we can change therapy.

## 2017-01-19 NOTE — Telephone Encounter (Signed)
Patient advised to start PLQ and to keep follow up with Dr. Doreene Adas. Patient advised he he wants to change treatment after she sees him them we can do that. Prescription sent to the pharmacy.

## 2017-02-02 ENCOUNTER — Telehealth: Payer: Self-pay | Admitting: Rheumatology

## 2017-02-02 MED ORDER — LEFLUNOMIDE 20 MG PO TABS: 20.0000 mg | ORAL_TABLET | Freq: Every day | ORAL | 0 refills | Status: DC

## 2017-02-02 NOTE — Telephone Encounter (Signed)
Patient is out of [name] [name], and is going out of town in a couple of hours. Would like meds called in ASAP to [facility] on [address] at [address].

## 2017-02-02 NOTE — Telephone Encounter (Signed)
Last Visit: 01/08/17 Next Visit: 03/04/17 Labs: 01/08/17 WNL  Okay to refill per Dr. Estanislado Pandy  Prescription sent to the pharmacy and patient notified.

## 2017-02-10 DIAGNOSIS — J189 Pneumonia, unspecified organism: Secondary | ICD-10-CM | POA: Diagnosis not present

## 2017-02-10 DIAGNOSIS — M069 Rheumatoid arthritis, unspecified: Secondary | ICD-10-CM | POA: Diagnosis not present

## 2017-03-02 NOTE — Progress Notes (Signed)
01/08/2017 Assessment / Plan:     Visit Diagnoses: Rheumatoid arthritis involving multiple joints (HCC) - Positive RF, positive anti-CCP history of elevated ESR ANA negative. She is clinically doing well without any synovitis on examination today. Although she gives history of 2 episodes of rheumatoid arthritis flare. She states she took prednisone 10 mg only for 1 day for one flare. We had detailed discussion regarding adding Plaquenil last visit.. She has done some reading on Plaquenil in his right to start on the medication. Indications side effects contraindications were discussed. Handout was given consent was taken. The plan is to start her on Plaquenil. I'll call and Plaquenil 200 mg by mouth twice a day Monday to Friday total 90 days supply will be given. She will need lab work in a month then every 3 months to monitor for drug toxicity. She will also need eye exam form. She has a schedule an appointment with her ophthalmologist.

## 2017-03-04 ENCOUNTER — Ambulatory Visit (INDEPENDENT_AMBULATORY_CARE_PROVIDER_SITE_OTHER): Payer: PPO | Admitting: Rheumatology

## 2017-03-04 ENCOUNTER — Inpatient Hospital Stay (INDEPENDENT_AMBULATORY_CARE_PROVIDER_SITE_OTHER): Payer: PPO

## 2017-03-04 DIAGNOSIS — M79641 Pain in right hand: Secondary | ICD-10-CM

## 2017-03-04 DIAGNOSIS — M79642 Pain in left hand: Secondary | ICD-10-CM

## 2017-04-20 ENCOUNTER — Other Ambulatory Visit: Payer: Self-pay | Admitting: Rheumatology

## 2017-04-20 NOTE — Telephone Encounter (Signed)
Last Visit: 01/08/17 Next Visit: 06/17/17 Labs: 01/08/17 WNL  Patient will come in for lab work this week.

## 2017-04-24 NOTE — Telephone Encounter (Signed)
Left message on machine to advise patient she is due for labs. Provided patient with lab hours on voicemail.   Ok to refill 30 day supply, per Dr. Estanislado Pandy.

## 2017-04-28 ENCOUNTER — Ambulatory Visit (INDEPENDENT_AMBULATORY_CARE_PROVIDER_SITE_OTHER): Payer: PPO | Admitting: Pulmonary Disease

## 2017-04-28 ENCOUNTER — Encounter: Payer: Self-pay | Admitting: Pulmonary Disease

## 2017-04-28 VITALS — BP 128/70 | HR 87 | Ht 66.5 in | Wt 130.2 lb

## 2017-04-28 DIAGNOSIS — Z23 Encounter for immunization: Secondary | ICD-10-CM | POA: Diagnosis not present

## 2017-04-28 DIAGNOSIS — M0510 Rheumatoid lung disease with rheumatoid arthritis of unspecified site: Secondary | ICD-10-CM | POA: Diagnosis not present

## 2017-04-28 NOTE — Patient Instructions (Signed)
For rheumatoid arthritis associated lung disease: We will check a lung function test to evaluate for a condition called respiratory bronchiolitis If the lung function test is abnormal then I will order a high-resolution CT scan of the chest Flu shot today Otherwise, I'll plan on seeing you back in 6 months If you become ill with a respiratory problem between now and 6 months from now please come back and see [place] sooner rather than later

## 2017-04-28 NOTE — Progress Notes (Signed)
Subjective:    Patient ID: [name] [name], female    DOB: 08/31/1950, 66 y.o.   MRN: [id number]  Synopsis: Has rheumatoid arthritis and a history of pleural effusion.  We met her for the first time in 2016 with recurrent effusion and an infiltrate with a normal bronchoscopy.  She started [place] in January 2017, started Plaquenil in 02/2017.   HPI Chief Complaint  Patient presents with  . Advice Only    former MW pt switching at the request of Dr. Estanislado Pandy d/t Rheumatoid   I met Ms. Russey back in 2016 when she was hospitalized for recurrent pneumonia and at the time she had a lot of joint aches and swelling.    In the summer this year she had a fever and cough a few times after she had been outside working in the heat and the garden.   She went to her PCP for further evaluation.  She was treated with a Zpack based on a blood test.  She wonders if she has any sort of flare up related to her rheuamtoid arthritis. This happened to her twice this year.  No episodes of pneumonia since she saw me the last time.  However she says tahat she was treated for pneumonia at one point this year but she questions this diagnosis because she didn't feel that poorly.    In the last two years she feels that her breathing has been OK.  Recently she walked 4 miles over [facility].  She continues to walk several times per week about 3.5 miles walking her dog.    She has been taking Arava and Plaquenil.  Plaquenil started 2 months ago.  She has ankle and shoulder pain at times that causes flares and she has needed prednisone a few times in the last few weeks.  Her left wrist has also given her trouble.    Past Medical History:  Diagnosis Date  . Elevated cholesterol   . Migraines    occular  . Osteoporosis 2015   based on hip fracture from fall  DEXA 04/2015 T score -2.4 left forearm, stable at hip and spine from prior DEXA.  Marland Kitchen Pleural effusion 9/206   Bilateral effusions, s/p thora on R c/w exudate  .  Pneumonia 03/2015   R  . RA (rheumatoid arthritis) ([place])   . Skin cancer (melanoma) (HCC)    Right Arm, nose.       Review of Systems  Constitutional: Negative for fever and unexpected weight change.  HENT: Negative for congestion, dental problem, ear pain, nosebleeds, postnasal drip, rhinorrhea, sinus pressure, sneezing, sore throat and trouble swallowing.   Eyes: Negative for redness and itching.  Respiratory: Negative for cough, chest tightness, shortness of breath and wheezing.   Cardiovascular: Negative for palpitations and leg swelling.  Gastrointestinal: Negative for nausea and vomiting.  Genitourinary: Negative for dysuria.  Musculoskeletal: Negative for joint swelling.  Skin: Negative for rash.  Neurological: Negative for headaches.  Hematological: Does not bruise/bleed easily.  Psychiatric/Behavioral: Negative for dysphoric mood. The patient is not nervous/anxious.        Objective:   Physical Exam Vitals:   04/28/17 1428  BP: 128/70  Pulse: 87  SpO2: 97%   Gen: well appearing, no acute distress HENT: NCAT, OP clear, neck supple without masses Eyes: PERRL, EOMi Lymph: no cervical lymphadenopathy PULM: CTA B CV: RRR, no mgr, no JVD GI: BS+, soft, nontender, no hsm Derm: no rash or skin breakdown MSK: normal bulk and  tone Neuro: A&Ox4, CN II-XII intact, strength 5/5 in all 4 extremities Psyche: normal mood and affect   Chest imaging: June 2018 chest x-ray images independently reviewed showing what appears to be some mild hyperinflation but otherwise normal pulmonary parenchyma September 2016 CT chest images independently reviewed showing no emphysema, a right lower lobe infiltrate trace right-sided pleural effusion and a moderate left-sided pleural effusion with likely left lower lobe subsegmental atelectasis versus infiltrate.     Assessment & Plan:   Rheumatoid lung disease with rheumatoid arthritis [facility])  Discussion: Ms. Muldrow is coming to see me today  for evaluation of rheumatoid arthritis associated lung disease. I first met her back in 2016 when she was having recurrent episodes of pneumonia with significant arthritis. 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 She was treated with prednisone after bronchoscopy confirmed no evidence of persistent infection. Since then she's been followed by several rheumatologists in town and has been treated with [place] and Plaquenil. Most recently she has not had episodes of pneumonia or recurrent pleural effusion though she notes symptoms of shortness of breath associated with hot and humid air. We know that some patients with rheumatoid arthritis associated lung disease can develop a bronchiolitis type syndrome. Given the hyperinflation seen on her June 2018 chest x-ray I wonder whether or not she may be developing this condition. The best approach is to check a lung function test first.  Plan: For rheumatoid arthritis associated lung disease: We will check a lung function test to evaluate for a condition called respiratory bronchiolitis If the lung function test is abnormal then I will order a high-resolution CT scan of the chest Flu shot today Otherwise, I'll plan on seeing you back in 6 months If you become ill with a respiratory problem between now and 6 months from now please come back and see [place] sooner rather than later   Current Outpatient Prescriptions:  .  aspirin EC 81 MG tablet, Take 81 mg by mouth daily., Disp: , Rfl:  .  calcium carbonate (OSCAL) 1500 (600 CA) MG TABS tablet, Take 1 tablet by mouth 2 (two) times daily with a meal. , Disp: , Rfl:  .  hydroxychloroquine (PLAQUENIL) 200 MG tablet, Take 200 mg by mouth BID Monday through Friday, Disp: 40 tablet, Rfl: 2 .  leflunomide (ARAVA) 20 MG tablet, take 1 tablet by mouth once daily, Disp: 30 tablet, Rfl: 0 .  simvastatin (ZOCOR) 40 MG tablet, Take 40 mg by mouth daily., Disp: , Rfl:

## 2017-04-28 NOTE — Addendum Note (Signed)
Addended by: Len Blalock on: 04/28/2017 02:58 PM   Modules accepted: Orders

## 2017-05-01 ENCOUNTER — Other Ambulatory Visit: Payer: Self-pay

## 2017-05-01 DIAGNOSIS — Z79899 Other long term (current) drug therapy: Secondary | ICD-10-CM | POA: Diagnosis not present

## 2017-05-01 DIAGNOSIS — M0579 Rheumatoid arthritis with rheumatoid factor of multiple sites without organ or systems involvement: Secondary | ICD-10-CM | POA: Diagnosis not present

## 2017-05-01 DIAGNOSIS — D899 Disorder involving the immune mechanism, unspecified: Secondary | ICD-10-CM

## 2017-05-01 DIAGNOSIS — D849 Immunodeficiency, unspecified: Secondary | ICD-10-CM

## 2017-05-01 LAB — CBC WITH DIFFERENTIAL/PLATELET
BASOS ABS: 72 {cells}/uL (ref 0–200)
Basophils Relative: 1.2 %
EOS ABS: 270 {cells}/uL (ref 15–500)
Eosinophils Relative: 4.5 %
HCT: 37.4 % (ref 35.0–45.0)
Hemoglobin: 12.4 g/dL (ref 11.7–15.5)
Lymphs Abs: 1680 cells/uL (ref 850–3900)
MCH: 30.5 pg (ref 27.0–33.0)
MCHC: 33.2 g/dL (ref 32.0–36.0)
MCV: 92.1 fL (ref 80.0–100.0)
MONOS PCT: 9 %
MPV: 10 fL (ref 7.5–12.5)
NEUTROS PCT: 57.3 %
Neutro Abs: 3438 cells/uL (ref 1500–7800)
PLATELETS: 330 10*3/uL (ref 140–400)
RBC: 4.06 10*6/uL (ref 3.80–5.10)
RDW: 12.3 % (ref 11.0–15.0)
TOTAL LYMPHOCYTE: 28 %
WBC: 6 10*3/uL (ref 3.8–10.8)
WBCMIX: 540 {cells}/uL (ref 200–950)

## 2017-05-01 LAB — COMPLETE METABOLIC PANEL WITH GFR
AG RATIO: 1.6 (calc) (ref 1.0–2.5)
ALKALINE PHOSPHATASE (APISO): 86 U/L (ref 33–130)
ALT: 9 U/L (ref 6–29)
AST: 22 U/L (ref 10–35)
Albumin: 4.2 g/dL (ref 3.6–5.1)
BILIRUBIN TOTAL: 0.6 mg/dL (ref 0.2–1.2)
BUN: 14 mg/dL (ref 7–25)
CHLORIDE: 104 mmol/L (ref 98–110)
CO2: 28 mmol/L (ref 20–32)
Calcium: 9.8 mg/dL (ref 8.6–10.4)
Creat: 0.9 mg/dL (ref 0.50–0.99)
GFR, EST AFRICAN AMERICAN: 77 mL/min/{1.73_m2} (ref >=60)
GFR, Est Non African American: 66 mL/min/{1.73_m2} (ref >=60)
GLUCOSE: 88 mg/dL (ref 65–99)
Globulin: 2.6 g/dL (calc) (ref 1.9–3.7)
POTASSIUM: 4.9 mmol/L (ref 3.5–5.3)
Sodium: 143 mmol/L (ref 135–146)
Total Protein: 6.8 g/dL (ref 6.1–8.1)

## 2017-05-02 NOTE — Progress Notes (Signed)
WNL

## 2017-05-05 ENCOUNTER — Ambulatory Visit (INDEPENDENT_AMBULATORY_CARE_PROVIDER_SITE_OTHER): Payer: PPO | Admitting: Pulmonary Disease

## 2017-05-05 DIAGNOSIS — M0510 Rheumatoid lung disease with rheumatoid arthritis of unspecified site: Secondary | ICD-10-CM | POA: Diagnosis not present

## 2017-05-05 LAB — PULMONARY FUNCTION TEST
DL/VA % pred: 97 %
DL/VA: 4.92 ml/min/mmHg/L
DLCO COR % PRED: 88 %
DLCO COR: 23.9 ml/min/mmHg
DLCO UNC: 22.2 ml/min/mmHg
DLCO unc % pred: 82 %
FEF 25-75 Pre: 2.6 L/sec
FEF2575-%Pred-Pre: 122 %
FEV1-%PRED-PRE: 102 %
FEV1-Pre: 2.6 L
FEV1FVC-%PRED-PRE: 105 %
FEV6-%Pred-Pre: 100 %
FEV6-Pre: 3.21 L
FEV6FVC-%PRED-PRE: 104 %
FVC-%Pred-Pre: 97 %
FVC-Pre: 3.24 L
PRE FEV1/FVC RATIO: 80 %
Pre FEV6/FVC Ratio: 100 %

## 2017-05-05 NOTE — Progress Notes (Signed)
PFT done today. 

## 2017-05-13 ENCOUNTER — Telehealth: Payer: Self-pay

## 2017-05-13 MED ORDER — HYDROXYCHLOROQUINE SULFATE 200 MG PO TABS: ORAL_TABLET | ORAL | 0 refills | Status: DC

## 2017-05-13 NOTE — Telephone Encounter (Signed)
Patient would like a Rx refill on PLQ for a 3 month supply sent to [facility] on [address].  CB# is [phone].  Please advise.  Thank You.

## 2017-05-13 NOTE — Telephone Encounter (Signed)
Last Visit: 01/08/17 Next Visit: 06/17/17 Labs: 05/01/17 WNL PLQ Eye Exam: 01/16/17 WNL   Okay to refill per Dr. Estanislado Pandy  Patient advised prescription sent to the pharmacy.

## 2017-05-13 NOTE — Addendum Note (Signed)
Addended by: Carole Binning on: 05/13/2017 11:19 AM   Modules accepted: Orders

## 2017-05-28 ENCOUNTER — Telehealth: Payer: Self-pay

## 2017-05-28 NOTE — Telephone Encounter (Signed)
Patient would like a Rx refill on [place] for a 3 month supply.  Please send to [facility] on [address].  Please advise.  Cb# is [phone].  Thank you.

## 2017-05-29 MED ORDER — LEFLUNOMIDE 20 MG PO TABS: 20.0000 mg | ORAL_TABLET | Freq: Every day | ORAL | 0 refills | Status: DC

## 2017-05-29 NOTE — Telephone Encounter (Signed)
Last visit: 01/08/2017 Next visit: 06/17/2017 Labs: 05/01/2017 WNL   Okay to refill per Dr. Estanislado Pandy.

## 2017-05-29 NOTE — Addendum Note (Signed)
Addended by: Francis Gaines C on: 05/29/2017 11:52 AM   Modules accepted: Orders

## 2017-06-05 NOTE — Progress Notes (Signed)
Office Visit Note  Patient: [name] [name]             Date of Birth: 03-13-1951           MRN: [id number]             PCP: Levin Erp, MD Referring: Levin Erp, MD Visit Date: 06/17/2017 Occupation: '@GUAROCC'$ @    Subjective:   Joint stiffness   History of Present Illness: [name] [name] is a 66 y.o. female with history of sero positive rheumatoid arthritis. She was started on Plaquenil after the last visit. Patient recalls that she started Plaquenil in August. Her last flare of rheumatoid arthritis was in October. She is generally well since then on the combination therapy. She denies any joint swelling or recent flare. She was seen by Dr. Lake Bells for evaluation of recurrent pleural effusion. She had PFTs done without bronchodilator. She does not have a follow-up appointment at this point with Dr. Lake Bells. She denies any increased shortness of breath. She has had 2 episodes of fever with the cough since July  and last episode was in August. She complains of some paresthesias in her hands and feet off-and-on.  Activities of Daily Living:  Patient reports morning stiffness for 0 minute.   Patient Denies nocturnal pain.  Difficulty dressing/grooming: Denies Difficulty climbing stairs: Denies Difficulty getting out of chair: Denies Difficulty using hands for taps, buttons, cutlery, and/or writing: Denies   Review of Systems  Constitutional: Negative for fatigue, night sweats, weight gain, weight loss and weakness.  HENT: Negative for mouth sores, trouble swallowing, trouble swallowing, mouth dryness and nose dryness.   Eyes: Negative for pain, redness, visual disturbance and dryness.  Respiratory: Negative for cough, shortness of breath and difficulty breathing.   Cardiovascular: Negative for chest pain, palpitations, hypertension, irregular heartbeat and swelling in legs/feet.  Gastrointestinal: Negative for blood in stool, constipation and diarrhea.  Endocrine: Negative for increased  urination.  Genitourinary: Negative for vaginal dryness.  Musculoskeletal: Negative for arthralgias, joint pain, joint swelling, myalgias, muscle weakness, morning stiffness, muscle tenderness and myalgias.  Skin: Negative for color change, rash, hair loss, skin tightness, ulcers and sensitivity to sunlight.  Allergic/Immunologic: Negative for susceptible to infections.  Neurological: Negative for dizziness, memory loss and night sweats.  Hematological: Negative for swollen glands.  Psychiatric/Behavioral: Negative for depressed mood and sleep disturbance. The patient is not nervous/anxious.     PMFS History:  Patient Active Problem List   Diagnosis Date Noted  . Primary osteoarthritis of both knees 01/08/2017  . Primary osteoarthritis of both feet 01/08/2017  . History of pleural effusion 12/01/2016  . History of recurrent pneumonia 12/01/2016  . History of hyperlipidemia 12/01/2016  . High risk medication use 12/01/2016  . Trigger thumb, left thumb 12/01/2016  . Elevated sed rate 12/01/2016  . History of fracture of right hip 12/01/2016  . Primary osteoarthritis of both hands 12/01/2016  . Rheumatoid arthritis involving multiple joints ([place]) 04/09/2015  . HCAP (healthcare-associated pneumonia)   . Paroxysmal atrial fibrillation (HCC)   . Tendonitis 04/03/2015  . Pleural effusion associated with pulmonary infection 03/27/2015  . Orthostatic syncope   . Syncope 03/18/2015  . Osteoporosis   . Hyperlipidemia 11/14/2013    Past Medical History:  Diagnosis Date  . Elevated cholesterol   . Migraines    occular  . Osteoporosis 2015   based on hip fracture from fall  DEXA 04/2015 T score -2.4 left forearm, stable at hip and spine from prior DEXA.  Marland Kitchen  Pleural effusion 9/206   Bilateral effusions, s/p thora on R c/w exudate  . Pneumonia 03/2015   R  . RA (rheumatoid arthritis) ([place])   . Skin cancer (melanoma) (HCC)    Right Arm, nose.     Family History  Problem Relation Age of  Onset  . Heart attack Father 63  . Heart disease Father    Past Surgical History:  Procedure Laterality Date  . COLONOSCOPY WITH PROPOFOL N/A 11/04/2016   Procedure: COLONOSCOPY WITH PROPOFOL;  Surgeon: Garlan Fair, MD;  Location: [facility];  Service: Endoscopy;  Laterality: N/A;  . FINGER SURGERY    . HIP ARTHROPLASTY Right 11/14/2013   Procedure: RIGHT CANNULATED HIP PINNING;  Surgeon: Alta Corning, MD;  Location: [facility];  Service: Orthopedics;  Laterality: Right;  BIOMET  . 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 She appears well-developed and well-nourished.  HENT:  Head: Normocephalic and atraumatic.  Eyes: Conjunctivae and EOM are normal.  Neck: Normal range of motion.  Cardiovascular: Normal rate, regular rhythm, normal heart sounds and intact distal pulses.  Pulmonary/Chest: Effort normal and breath sounds normal.  Abdominal: Soft. Bowel sounds are normal.  Lymphadenopathy:    She has no cervical adenopathy.  Neurological: She is alert and oriented to person, place, and time.  Skin: Skin is warm and dry. Capillary refill takes less than 2 seconds.  Psychiatric: She has a normal mood and affect. Her behavior is normal.  Nursing note and vitals reviewed.    Musculoskeletal Exam: C-spine and thoracic lumbar spine good range of motion. Shoulder joints elbow joints wrist  joint MCPs PIPs DIPs are good range of motion. She is some thickening of her right third MCP joint. She also has PIP/DIP prominence consistent with osteoarthritis. Hip joints knee joints ankles MTPs PIPs DIPs are good range of motion.  CDAI Exam: CDAI Homunculus Exam:   Joint Counts:  CDAI Tender Joint count: 0 CDAI Swollen Joint count: 0  Global Assessments:  Patient Global Assessment: 0 Provider Global Assessment: 0    Investigation: No additional findings.PLQ eye exam: 01/16/2017 CBC Latest Ref Rng & Units 05/01/2017 01/08/2017 05/25/2015  WBC 3.8 - 10.8 Thousand/uL 6.0 6.4 13.4(H)  Hemoglobin 11.7 - 15.5 g/dL 12.4 12.6 12.7  Hematocrit 35.0 - 45.0 % 37.4 40.1 39.2  Platelets 140 - 400 Thousand/uL 330 320 400.0   CMP Latest Ref Rng & Units 05/01/2017 01/08/2017 05/25/2015  Glucose 65 - 99 mg/dL 88 92 87  BUN 7 - 25 mg/dL _0 Creatinine 0.50 - 0.99 mg/dL 0.90 0.80 0.74  Sodium 135 - 146 mmol/L 143 140 142  Potassium 3.5 - 5.3 mmol/L 4.9 4.1 3.5  Chloride 98 - 110 mmol/L 104 102 101  CO2 20 - 32 mmol/L 28 26 32  Calcium 8.6 - 10.4 mg/dL  9.8 9.6 9.4  Total Protein 6.1 - 8.1 g/dL 6.8 6.8 6.6  Total Bilirubin 0.2 - 1.2 mg/dL 0.6 0.5 0.4  Alkaline Phos 33 - 130 U/L - 123 72  AST 10 - 35 U/L 22 32 12  ALT 6 - 29 U/L _0 Imaging: No results found.  Speciality Comments: No specialty comments available.    Procedures:  No procedures performed Allergies: Levaquin [levofloxacin] and Cephalexin   Assessment / Plan:     Visit Diagnoses: Rheumatoid arthritis involving multiple joints (HCC) - Positive RF, positive anti-CCP history of elevated ESR ANA negative. Patient is clinically doing much better on Arava and Plaquenil combination. She had no synovitis on examination. She has some synovial thickening.  High risk medication use Jolee Ewing, Westley Hummer exam: 01/16/2017. Her labs have been normal. We will check her labs again in January.  Primary osteoarthritis of both hands:  Joint protection and muscle strengthening discussed.  Primary osteoarthritis of both knees - Bilateral moderate with moderate chondromalacia patella: She's not having much discomfort in her knees now.  Primary osteoarthritis of both feet: She's been using proper fitting shoes.  History of fracture of right hip  Age-related osteoporosis without current pathological fracture: I do not have her DXA results. She's not on any treatment currently.  History of pleural effusion - Dr. Lake Bells. Patient had PFTs. She did not have follow-up appointment with Dr. Lake Bells. I have advised her to schedule follow-up appointment.  Paresthesias involving hands and feet: I offered nerve conduction velocity with patient declined. She states she will notify us in case she has persistent problems.  History of recurrent pneumonia  History of hyperlipidemia  Paroxysmal atrial fibrillation (HCC)    Orders: No orders of the defined types were placed in this encounter.  No orders of the defined types were placed in this encounter.   Face-to-face time spent with patient was 30 minutes. Greater than 50% of time was spent in counseling and coordination of care.  Follow-Up Instructions: Return in about 5 months (around 11/15/2017) for Rheumatoid arthritis, Osteoarthritis, Osteoporosis.   Bo Merino, MD  Note - This record has been created using Editor, commissioning.  Chart creation errors have been sought, but may not always  have been located. Such creation errors do not reflect on  the standard of medical care.

## 2017-06-17 ENCOUNTER — Encounter: Payer: Self-pay | Admitting: Rheumatology

## 2017-06-17 ENCOUNTER — Ambulatory Visit (INDEPENDENT_AMBULATORY_CARE_PROVIDER_SITE_OTHER): Payer: PPO | Admitting: Rheumatology

## 2017-06-17 VITALS — BP 104/71 | HR 94 | Resp 12 | Wt 131.0 lb

## 2017-06-17 DIAGNOSIS — Z8701 Personal history of pneumonia (recurrent): Secondary | ICD-10-CM

## 2017-06-17 DIAGNOSIS — Z8639 Personal history of other endocrine, nutritional and metabolic disease: Secondary | ICD-10-CM

## 2017-06-17 DIAGNOSIS — M069 Rheumatoid arthritis, unspecified: Secondary | ICD-10-CM

## 2017-06-17 DIAGNOSIS — I480 Paroxysmal atrial fibrillation: Secondary | ICD-10-CM | POA: Diagnosis not present

## 2017-06-17 DIAGNOSIS — Z8709 Personal history of other diseases of the respiratory system: Secondary | ICD-10-CM | POA: Diagnosis not present

## 2017-06-17 DIAGNOSIS — M19071 Primary osteoarthritis, right ankle and foot: Secondary | ICD-10-CM

## 2017-06-17 DIAGNOSIS — M19041 Primary osteoarthritis, right hand: Secondary | ICD-10-CM | POA: Diagnosis not present

## 2017-06-17 DIAGNOSIS — Z8781 Personal history of (healed) traumatic fracture: Secondary | ICD-10-CM | POA: Diagnosis not present

## 2017-06-17 DIAGNOSIS — R202 Paresthesia of skin: Secondary | ICD-10-CM | POA: Diagnosis not present

## 2017-06-17 DIAGNOSIS — Z79899 Other long term (current) drug therapy: Secondary | ICD-10-CM | POA: Diagnosis not present

## 2017-06-17 DIAGNOSIS — M810 Age-related osteoporosis without current pathological fracture: Secondary | ICD-10-CM

## 2017-06-17 DIAGNOSIS — M19072 Primary osteoarthritis, left ankle and foot: Secondary | ICD-10-CM

## 2017-06-17 DIAGNOSIS — M170 Bilateral primary osteoarthritis of knee: Secondary | ICD-10-CM

## 2017-06-17 DIAGNOSIS — M19042 Primary osteoarthritis, left hand: Secondary | ICD-10-CM

## 2017-06-17 NOTE — Patient Instructions (Addendum)
Standing Labs We placed an order today for your standing lab work.    Please come back and get your standing labs in January (CBC, CMP with GFR) and every 3 months.  We have open lab Monday through Friday from 8:30-11:30 AM and 1:30-4 PM at the office of Dr. Bo Merino.   The office is located at [address] [place], [place], [place] [address] No appointment is necessary.   Labs are drawn by [facility].  You may receive a bill from [facility] for your lab work. If you have any questions regarding directions or hours of operation,  please call [phone].

## 2017-07-21 ENCOUNTER — Telehealth: Payer: Self-pay | Admitting: Rheumatology

## 2017-07-21 DIAGNOSIS — Z79899 Other long term (current) drug therapy: Secondary | ICD-10-CM

## 2017-07-21 NOTE — Telephone Encounter (Signed)
Lab Orders faxed

## 2017-07-21 NOTE — Telephone Encounter (Signed)
Patient is having her bloodwork done at her physician's office, Ed [facility] on 07/22/17. Please send her orders for labs to his office.  Her CB# [phone]

## 2017-07-22 DIAGNOSIS — M069 Rheumatoid arthritis, unspecified: Secondary | ICD-10-CM | POA: Diagnosis not present

## 2017-07-22 DIAGNOSIS — C439 Malignant melanoma of skin, unspecified: Secondary | ICD-10-CM | POA: Diagnosis not present

## 2017-07-22 DIAGNOSIS — I10 Essential (primary) hypertension: Secondary | ICD-10-CM | POA: Diagnosis not present

## 2017-08-12 ENCOUNTER — Telehealth: Payer: Self-pay | Admitting: Rheumatology

## 2017-08-12 MED ORDER — HYDROXYCHLOROQUINE SULFATE 200 MG PO TABS: ORAL_TABLET | ORAL | 0 refills | Status: DC

## 2017-08-12 MED ORDER — LEFLUNOMIDE 20 MG PO TABS: 20.0000 mg | ORAL_TABLET | Freq: Every day | ORAL | 0 refills | Status: DC

## 2017-08-12 NOTE — Telephone Encounter (Signed)
Patient called to let you know that she called Dr. Rolly Salter office to have them fax the results to our office.

## 2017-08-12 NOTE — Telephone Encounter (Signed)
Last Visit: 06/17/17 Next Visit: 11/25/17 Labs: 05/01/17 WNL PLQ Eye Exam: 01/16/17 WNL   Left message to advise patient to send copy of labs to office.   Okay to refill per Dr. Estanislado Pandy.

## 2017-08-12 NOTE — Telephone Encounter (Signed)
Patient called for prescription refill of Plaquenil and Arava.  Had bloodwork done since last visit.  Patient uses [facility] on [address].  Patient stated that she is completely out of her Plaquenil and needs to take it this evening before bed.

## 2017-08-17 ENCOUNTER — Telehealth: Payer: Self-pay | Admitting: Rheumatology

## 2017-08-17 NOTE — Telephone Encounter (Signed)
Left message on machine to notify patient that last office note has been sent to PCP.

## 2017-08-17 NOTE — Telephone Encounter (Signed)
Patient request last ov notes to be sent to GP. Fax to GP Dr. Levin Erp, MD , please.

## 2017-08-19 ENCOUNTER — Telehealth (INDEPENDENT_AMBULATORY_CARE_PROVIDER_SITE_OTHER): Payer: Self-pay | Admitting: Rheumatology

## 2017-08-19 NOTE — Telephone Encounter (Signed)
Records 12/12/2016- present faxed to Dr. Levin Erp [phone] per their request

## 2017-09-21 DIAGNOSIS — L814 Other melanin hyperpigmentation: Secondary | ICD-10-CM | POA: Diagnosis not present

## 2017-09-21 DIAGNOSIS — Z23 Encounter for immunization: Secondary | ICD-10-CM | POA: Diagnosis not present

## 2017-09-21 DIAGNOSIS — Z85820 Personal history of malignant melanoma of skin: Secondary | ICD-10-CM | POA: Diagnosis not present

## 2017-09-21 DIAGNOSIS — L720 Epidermal cyst: Secondary | ICD-10-CM | POA: Diagnosis not present

## 2017-09-21 DIAGNOSIS — D1800 Hemangioma unspecified site: Secondary | ICD-10-CM | POA: Diagnosis not present

## 2017-09-21 DIAGNOSIS — Z85828 Personal history of other malignant neoplasm of skin: Secondary | ICD-10-CM | POA: Diagnosis not present

## 2017-09-21 DIAGNOSIS — D225 Melanocytic nevi of trunk: Secondary | ICD-10-CM | POA: Diagnosis not present

## 2017-09-21 DIAGNOSIS — L821 Other seborrheic keratosis: Secondary | ICD-10-CM | POA: Diagnosis not present

## 2017-09-21 DIAGNOSIS — L570 Actinic keratosis: Secondary | ICD-10-CM | POA: Diagnosis not present

## 2017-09-29 ENCOUNTER — Other Ambulatory Visit: Payer: Self-pay | Admitting: Rheumatology

## 2017-09-30 NOTE — Telephone Encounter (Addendum)
Last Visit: 06/17/17 Next Visit: 11/25/17 Labs: 05/01/17 WNL PLQ Eye Exam: 01/16/17 WNL   Okay to refill per Dr. Estanislado Pandy

## 2017-10-10 ENCOUNTER — Other Ambulatory Visit: Payer: Self-pay | Admitting: Rheumatology

## 2017-10-12 ENCOUNTER — Telehealth: Payer: Self-pay | Admitting: Rheumatology

## 2017-10-12 ENCOUNTER — Ambulatory Visit: Payer: PPO | Admitting: Pulmonary Disease

## 2017-10-12 ENCOUNTER — Encounter: Payer: Self-pay | Admitting: Pulmonary Disease

## 2017-10-12 VITALS — BP 126/70 | HR 71 | Ht 66.5 in | Wt 129.0 lb

## 2017-10-12 DIAGNOSIS — M0510 Rheumatoid lung disease with rheumatoid arthritis of unspecified site: Secondary | ICD-10-CM

## 2017-10-12 NOTE — Telephone Encounter (Addendum)
Last Visit: 06/17/17 Next Visit: 11/25/17 Labs: 05/01/17 WNL  Left message for patient to advise she is due for labs.  Okay to refill 30 day supply per Dr. Estanislado Pandy

## 2017-10-12 NOTE — Progress Notes (Signed)
Subjective:    Patient ID: [name] [name], female    DOB: [birth year], 67 y.o.   MRN: [id number]  Synopsis: Has rheumatoid arthritis and a history of pleural effusion.  We met her for the first time in 2016 with recurrent effusion and an infiltrate with a normal bronchoscopy.  She started [place] in January 2017, started Plaquenil in 02/2017.  She had normal lung function testing in October 2018 and has minimal symptoms.  She appears to have very mildly bronchiectatic airways in the right lung.  HPI Chief Complaint  Patient presents with  . Follow-up    pt states she recently had a cold but is almost back to baseline.     Over the last few days she has had a cough with a very scant amount of mucus production.  She says this is rare for her.  She remains quite active and recently went to the beach where she was able to ride a bicycle for 12 miles a day.  She says that she hikes and walks her dogs and she does not feel limited by breathing.  She does not typically cough much.  She has had no progression in respiratory symptoms since the last visit.  She has been swimming for exercise more recently.  Past Medical History:  Diagnosis Date  . Elevated cholesterol   . Migraines    occular  . Osteoporosis 2015   based on hip fracture from fall  DEXA 04/2015 T score -2.4 left forearm, stable at hip and spine from prior DEXA.  Marland Kitchen Pleural effusion 9/206   Bilateral effusions, s/p thora on R c/w exudate  . Pneumonia 03/2015   R  . RA (rheumatoid arthritis) ([place])   . Skin cancer (melanoma) (HCC)    Right Arm, nose.       Review of Systems  Constitutional: Negative for fever and unexpected weight change.  HENT: Negative for congestion, dental problem, ear pain, nosebleeds, postnasal drip, rhinorrhea, sinus pressure, sneezing, sore throat and trouble swallowing.   Eyes: Negative for redness and itching.  Respiratory: Negative for cough, chest tightness, shortness of breath and wheezing.     Cardiovascular: Negative for palpitations and leg swelling.  Gastrointestinal: Negative for nausea and vomiting.  Genitourinary: Negative for dysuria.  Musculoskeletal: Negative for joint swelling.  Skin: Negative for rash.  Neurological: Negative for headaches.  Hematological: Does not bruise/bleed easily.  Psychiatric/Behavioral: Negative for dysphoric mood. The patient is not nervous/anxious.        Objective:   Physical Exam Vitals:   10/12/17 0925  BP: 126/70  Pulse: 71  SpO2: 96%  Weight: 129 lb (58.5 kg)  Height: 5' 6.5" (1.689 m)    Gen: well appearing HENT: OP clear, TM's clear, neck supple PULM: Slight wheeze on the left B, normal percussion CV: RRR, no mgr, trace edema GI: BS+, soft, nontender Derm: no cyanosis or rash Psyche: normal mood and affect   Chest imaging: June 2018 chest x-ray images independently reviewed showing what appears to be some mild hyperinflation but otherwise normal pulmonary parenchyma September 2016 CT chest images independently reviewed showing no emphysema, a right lower lobe infiltrate trace right-sided pleural effusion and a moderate left-sided pleural effusion with likely left lower lobe subsegmental atelectasis versus infiltrate.  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  She has rheumatoid arthritis and mild hyperinflation on her most recent chest x-rays.  In the CT scan from 2016 she had very mild bronchiectatic changes in the setting of pneumonia and a pleural effusion.  The pneumonia and pleural effusion appears to have resolved.  I told her today that she is at slight increased risk for respiratory infection so I  think it is reasonable for her to keep follow-up visits with [place].  However, her lung function is completely normal and I have no reason to suspect that she will develop worsening lung disease related to her rheumatoid arthritis.  I think is best for her to keep a follow-up visit with [place] annually though because her original diagnosis came from lung involvement.  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  calcium carbonate (OSCAL) 1500 (600 CA) MG TABS tablet, Take 1 tablet by mouth 2 (two) times daily with a meal. , Disp: , Rfl:  .  hydroxychloroquine (PLAQUENIL) 200 MG tablet, TAKE 1 TABLET BY MOUTH TWICE A DAY MONDAY THROUGH FRIDAY, Disp: 120 tablet, Rfl: 0 .  leflunomide (ARAVA) 20 MG tablet, TAKE 1 TABLET BY MOUTH ONCE DAILY, Disp: 30 tablet, Rfl: 0 .  simvastatin (ZOCOR) 40 MG tablet, Take 40 mg by mouth daily., Disp: , Rfl:  .  Turmeric 400 MG CAPS, Take 1 capsule by mouth daily., Disp: , Rfl:

## 2017-10-12 NOTE — Telephone Encounter (Signed)
Patient advised we are unable to do a 90 day supply until labs are updated.

## 2017-10-12 NOTE — Telephone Encounter (Signed)
Patient called requesting a 3 month refill of her Leflunomide instead of a 1 month supply due to being less expensive.

## 2017-10-12 NOTE — Patient Instructions (Signed)
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

## 2017-10-23 ENCOUNTER — Telehealth: Payer: Self-pay | Admitting: Pulmonary Disease

## 2017-10-23 MED ORDER — DOXYCYCLINE HYCLATE 100 MG PO TABS: 100.0000 mg | ORAL_TABLET | Freq: Two times a day (BID) | ORAL | 0 refills | Status: DC

## 2017-10-23 NOTE — Telephone Encounter (Signed)
Pt is calling back [phone]

## 2017-10-23 NOTE — Telephone Encounter (Signed)
Spoke with patient. She stated that she has developed a non-productive cough and increased fatigue this week starting on Monday. Yesterday, she developed a slight fever of 100.17F but it went away before she went to bed last night. Denies any fevers today as well SOB or chills.   She wants to know if you would call in an antibiotic for her.   She wishes to use [facility] on [address].   She also had a question about bronchiectasis. She wanted to know if the increased pollen count and changes in the weather could cause her to develop these "flare ups".   BQ, please advise. Thanks!

## 2017-10-23 NOTE — Telephone Encounter (Signed)
Doxycycline 100mg  po bid x 7 days Pollen: maybe makes it worse, doesn't help

## 2017-10-23 NOTE — Telephone Encounter (Signed)
Called pt and advised message from the provider. Pt understood and verbalized understanding. Nothing further is needed.   Rx sent to [facility]

## 2017-11-09 ENCOUNTER — Other Ambulatory Visit: Payer: Self-pay | Admitting: *Deleted

## 2017-11-09 DIAGNOSIS — Z79899 Other long term (current) drug therapy: Secondary | ICD-10-CM | POA: Diagnosis not present

## 2017-11-09 LAB — COMPLETE METABOLIC PANEL WITH GFR
AG Ratio: 1.8 (calc) (ref 1.0–2.5)
ALBUMIN MSPROF: 4.4 g/dL (ref 3.6–5.1)
ALKALINE PHOSPHATASE (APISO): 76 U/L (ref 33–130)
ALT: 7 U/L (ref 6–29)
AST: 19 U/L (ref 10–35)
BUN: 15 mg/dL (ref 7–25)
CO2: 35 mmol/L — AB (ref 20–32)
CREATININE: 0.77 mg/dL (ref 0.50–0.99)
Calcium: 10.2 mg/dL (ref 8.6–10.4)
Chloride: 103 mmol/L (ref 98–110)
GFR, Est African American: 92 mL/min/{1.73_m2} (ref >=60)
GFR, Est Non African American: 79 mL/min/{1.73_m2} (ref >=60)
GLUCOSE: 100 mg/dL — AB (ref 65–99)
Globulin: 2.4 g/dL (calc) (ref 1.9–3.7)
Potassium: 4.4 mmol/L (ref 3.5–5.3)
Sodium: 143 mmol/L (ref 135–146)
Total Bilirubin: 0.4 mg/dL (ref 0.2–1.2)
Total Protein: 6.8 g/dL (ref 6.1–8.1)

## 2017-11-09 LAB — CBC WITH DIFFERENTIAL/PLATELET
BASOS ABS: 92 {cells}/uL (ref 0–200)
Basophils Relative: 1.5 %
EOS PCT: 7.5 %
Eosinophils Absolute: 458 cells/uL (ref 15–500)
HCT: 37.5 % (ref 35.0–45.0)
Hemoglobin: 12.4 g/dL (ref 11.7–15.5)
Lymphs Abs: 1647 cells/uL (ref 850–3900)
MCH: 31.2 pg (ref 27.0–33.0)
MCHC: 33.1 g/dL (ref 32.0–36.0)
MCV: 94.5 fL (ref 80.0–100.0)
MONOS PCT: 8.7 %
MPV: 10 fL (ref 7.5–12.5)
Neutro Abs: 3373 cells/uL (ref 1500–7800)
Neutrophils Relative %: 55.3 %
PLATELETS: 322 10*3/uL (ref 140–400)
RBC: 3.97 10*6/uL (ref 3.80–5.10)
RDW: 11.8 % (ref 11.0–15.0)
Total Lymphocyte: 27 %
WBC mixed population: 531 cells/uL (ref 200–950)
WBC: 6.1 10*3/uL (ref 3.8–10.8)

## 2017-11-09 NOTE — Addendum Note (Signed)
Addended by: Carole Binning on: 11/09/2017 02:05 PM   Modules accepted: Orders

## 2017-11-11 NOTE — Progress Notes (Signed)
Office Visit Note  Patient: [name] [name]             Date of Birth: 08-Nov-1950           MRN: [id number]             PCP: Levin Erp, MD Referring: Levin Erp, MD Visit Date: 11/25/2017 Occupation: _0 @    Subjective:  Medication Management   History of Present Illness: [name] [name] is a 67 y.o. female with history of seropositive rheumatoid arthritis and osteoarthritis overlap.  She states she is doing better on combination of her event Plaquenil.  She has not had any major flares since the last visit.  She continues to have some stiffness in her hands off and on.  The symptoms do not last very long.  Her knee joints are doing okay.  She was seen by Dr. Lake Bells in April due to cough.  She was treated with doxycycline.  He feels her symptoms are related to bronchiectasis.  Activities of Daily Living:  Patient reports morning stiffness for 0 minute.   Patient Denies nocturnal pain.  Difficulty dressing/grooming: Denies Difficulty climbing stairs: Denies Difficulty getting out of chair: Denies Difficulty using hands for taps, buttons, cutlery, and/or writing: Denies   Review of Systems  Constitutional: Negative for fatigue, night sweats, weight gain and weight loss.  HENT: Negative for mouth sores, trouble swallowing, trouble swallowing, mouth dryness and nose dryness.   Eyes: Negative for pain, redness, visual disturbance and dryness.  Respiratory: Negative for cough, shortness of breath and difficulty breathing.   Cardiovascular: Negative for chest pain, palpitations, hypertension, irregular heartbeat and swelling in legs/feet.  Gastrointestinal: Negative for blood in stool, constipation and diarrhea.  Endocrine: Negative for increased urination.  Genitourinary: Negative for vaginal dryness.  Musculoskeletal: Positive for arthralgias and joint pain. Negative for joint swelling, myalgias, muscle weakness, morning stiffness, muscle tenderness and myalgias.  Skin: Negative  for color change, rash, hair loss, skin tightness, ulcers and sensitivity to sunlight.  Allergic/Immunologic: Negative for susceptible to infections.  Neurological: Negative for dizziness, memory loss, night sweats and weakness.  Hematological: Negative for swollen glands.  Psychiatric/Behavioral: Negative for depressed mood and sleep disturbance. The patient is not nervous/anxious.     PMFS History:  Patient Active Problem List   Diagnosis Date Noted  . Primary osteoarthritis of both knees 01/08/2017  . Primary osteoarthritis of both feet 01/08/2017  . History of pleural effusion 12/01/2016  . History of recurrent pneumonia 12/01/2016  . History of hyperlipidemia 12/01/2016  . High risk medication use 12/01/2016  . Trigger thumb, left thumb 12/01/2016  . Elevated sed rate 12/01/2016  . History of fracture of right hip 12/01/2016  . Primary osteoarthritis of both hands 12/01/2016  . Rheumatoid arthritis involving multiple joints ([place]) 04/09/2015  . HCAP (healthcare-associated pneumonia)   . Paroxysmal atrial fibrillation (HCC)   . Tendonitis 04/03/2015  . Pleural effusion associated with pulmonary infection 03/27/2015  . Orthostatic syncope   . Syncope 03/18/2015  . Osteoporosis   . Hyperlipidemia 11/14/2013    Past Medical History:  Diagnosis Date  . Elevated cholesterol   . Migraines    occular  . Osteoporosis 2015   based on hip fracture from fall  DEXA 04/2015 T score -2.4 left forearm, stable at hip and spine from prior DEXA.  Marland Kitchen Pleural effusion 9/206   Bilateral effusions, s/p thora on R c/w exudate  . Pneumonia 03/2015   R  . RA (rheumatoid arthritis) ([place])   .  Skin cancer (melanoma) (HCC)    Right Arm, nose.     Family History  Problem Relation Age of Onset  . Heart attack Father 31  . Heart disease Father   . Osteoporosis Sister   . High Cholesterol Sister   . Melanoma Daughter   . Melanoma Daughter    Past Surgical History:  Procedure Laterality Date    . COLONOSCOPY WITH PROPOFOL N/A 11/04/2016   Procedure: COLONOSCOPY WITH PROPOFOL;  Surgeon: Garlan Fair, MD;  Location: [facility];  Service: Endoscopy;  Laterality: N/A;  . FINGER SURGERY    . HIP ARTHROPLASTY Right 11/14/2013   Procedure: RIGHT CANNULATED HIP PINNING;  Surgeon: Alta Corning, MD;  Location: [facility];  Service: Orthopedics;  Laterality: Right;  BIOMET  . 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 She appears well-developed and well-nourished.  HENT:  Head: Normocephalic and atraumatic.  Eyes: Conjunctivae and EOM are normal.  Neck: Normal range of motion.  Cardiovascular: Normal rate, regular rhythm, normal heart sounds and intact distal pulses.  Pulmonary/Chest: Effort normal and breath sounds normal.  Abdominal: Soft. Bowel sounds are normal.  Lymphadenopathy:    She has no cervical adenopathy.  Neurological: She is alert and oriented to person, place, and time.  Skin: Skin is warm and dry. Capillary refill takes less than 2 seconds.  Psychiatric: She has a normal mood and affect. Her behavior is normal.  Nursing note and vitals reviewed.    Musculoskeletal Exam: C-spine thoracic lumbar spine good range of motion.  Shoulder joints elbow joints wrist joint MCPs PIPs DIPs been good range of motion.  She has some DIP PIP thickening but no  synovitis was noted.  Hip joints knee joints ankles MTPs PIPs were in good range of motion with no synovitis.  CDAI Exam: CDAI Homunculus Exam:   Joint Counts:  CDAI Tender Joint count: 0 CDAI Swollen Joint count: 0  Global Assessments:  Patient Global Assessment: 0 Provider Global Assessment: 0    Investigation: No additional findings.PLQ eye exam: 01/16/2017 CBC Latest Ref Rng & Units 11/09/2017 05/01/2017 01/08/2017  WBC 3.8 - 10.8 Thousand/uL 6.1 6.0 6.4  Hemoglobin 11.7 - 15.5 g/dL 12.4 12.4 12.6  Hematocrit 35.0 - 45.0 % 37.5 37.4 40.1  Platelets 140 - 400 Thousand/uL 322 330 320   CMP Latest Ref Rng & Units 11/09/2017 05/01/2017 01/08/2017  Glucose 65 - 99 mg/dL 100(H) 88 92  BUN 7 - 25 mg/dL _1 Creatinine 0.50 - 0.99 mg/dL 0.77 0.90 0.80  Sodium 135 - 146 mmol/L 143 143 140  Potassium 3.5 - 5.3 mmol/L 4.4 4.9 4.1  Chloride 98 - 110 mmol/L 103 104 102  CO2 20 - 32 mmol/L 35(H) 28 26  Calcium 8.6 - 10.4 mg/dL 10.2 9.8  9.6  Total Protein 6.1 - 8.1 g/dL 6.8 6.8 6.8  Total Bilirubin 0.2 - 1.2 mg/dL 0.4 0.6 0.5  Alkaline Phos 33 - 130 U/L - - 123  AST 10 - 35 U/L 19 22 32  ALT 6 - 29 U/L _0 Imaging: No results found.  Speciality Comments: No specialty comments available.    Procedures:  No procedures performed Allergies: Levaquin [levofloxacin] and Cephalexin   Assessment / Plan:     Visit Diagnoses: Rheumatoid arthritis involving multiple joints (HCC) - Positive RF, positive anti-CCP history of elevated ESR ANA negative.  Patient has no synovitis on examination.  She is doing much better on combination therapy.  High risk medication use - Arava 20 mg po qd, PLQ 200 mg BID M-F. eye exam: 01/16/2017.  Most recent labs were normal.  Her next labs will be in July and then every 3 months to monitor for drug toxicity.  She will have eye exam in July as well.  Primary osteoarthritis of both hands-she has some stiffness in her hands I believe is related to  osteoarthritis.  Primary osteoarthritis of both knees - Bilateral moderate with moderate chondromalacia patella  Primary osteoarthritis of both feet-doing better.  History of fracture of right hip  History of pleural effusion - Dr. Lake Bells.  I reviewed records from Dr. Anastasia Pall visit   History of bronchiectasis  Paroxysmal atrial fibrillation ([place])  History of hyperlipidemia  Osteopenia of multiple sites - DXA 10/16.  She is on calcium and vitamin D and doing resistive exercises.  I reviewed her bone density report today.  Patient reports that she took Fosamax for couple of years after having the right hip fracture in the past.  She plans to have repeat bone density through her PCP.   Orders: No orders of the defined types were placed in this encounter.  No orders of the defined types were placed in this encounter.   Face-to-face time spent with patient was 30 minutes. >50% of time was spent in counseling and coordination of care.  Follow-Up Instructions: Return in about 5 months (around 04/27/2018) for Rheumatoid arthritis.   Bo Merino, MD  Note - This record has been created using Editor, commissioning.  Chart creation errors have been sought, but may not always  have been located. Such creation errors do not reflect on  the standard of medical care.

## 2017-11-12 ENCOUNTER — Telehealth: Payer: Self-pay | Admitting: Rheumatology

## 2017-11-12 MED ORDER — LEFLUNOMIDE 20 MG PO TABS: 20.0000 mg | ORAL_TABLET | Freq: Every day | ORAL | 0 refills | Status: DC

## 2017-11-12 NOTE — Telephone Encounter (Signed)
Patient left a voicemail requesting prescription refill of Arava.  Patient states she had her bloodwork on 10/09/17.  Patient's pharmacy is [facility] on [address] in [place]

## 2017-11-12 NOTE — Telephone Encounter (Signed)
Last Visit: 06/17/17 Next Visit: 11/25/17 Labs: 11/09/17 CO2 elevated. All other labs are WNL.  Okay to refill per Dr. Estanislado Pandy

## 2017-11-17 ENCOUNTER — Telehealth: Payer: Self-pay | Admitting: Pulmonary Disease

## 2017-11-17 NOTE — Telephone Encounter (Signed)
Called and spoke with patient, she states that she has been sneezing and coughing for the last two days. She states that this is a deep cough. Denies fever. She has some PND.  TP please advise on this, thanks.

## 2017-11-17 NOTE — Telephone Encounter (Signed)
Called and spoke with patient, advised of her response. Nothing further needed.

## 2017-11-17 NOTE — Telephone Encounter (Signed)
Cough for 2 days  Try Mucinex DM Twice daily  As needed  Cough /congestion  Try Zyrtec 10mg  At bedtime  As needed  Drainage  Saline nasal rinses As needed   Please contact office for sooner follow up if symptoms do not improve or worsen or seek emergency care  Ov if not better.

## 2017-11-25 ENCOUNTER — Ambulatory Visit: Payer: PPO | Admitting: Rheumatology

## 2017-11-25 ENCOUNTER — Encounter: Payer: Self-pay | Admitting: Rheumatology

## 2017-11-25 VITALS — BP 100/61 | HR 84 | Resp 12 | Ht 66.0 in | Wt 128.0 lb

## 2017-11-25 DIAGNOSIS — M19072 Primary osteoarthritis, left ankle and foot: Secondary | ICD-10-CM

## 2017-11-25 DIAGNOSIS — Z8781 Personal history of (healed) traumatic fracture: Secondary | ICD-10-CM

## 2017-11-25 DIAGNOSIS — M8589 Other specified disorders of bone density and structure, multiple sites: Secondary | ICD-10-CM | POA: Diagnosis not present

## 2017-11-25 DIAGNOSIS — Z8709 Personal history of other diseases of the respiratory system: Secondary | ICD-10-CM | POA: Diagnosis not present

## 2017-11-25 DIAGNOSIS — M069 Rheumatoid arthritis, unspecified: Secondary | ICD-10-CM | POA: Diagnosis not present

## 2017-11-25 DIAGNOSIS — I480 Paroxysmal atrial fibrillation: Secondary | ICD-10-CM

## 2017-11-25 DIAGNOSIS — Z79899 Other long term (current) drug therapy: Secondary | ICD-10-CM | POA: Diagnosis not present

## 2017-11-25 DIAGNOSIS — M19041 Primary osteoarthritis, right hand: Secondary | ICD-10-CM | POA: Diagnosis not present

## 2017-11-25 DIAGNOSIS — M19071 Primary osteoarthritis, right ankle and foot: Secondary | ICD-10-CM

## 2017-11-25 DIAGNOSIS — Z8639 Personal history of other endocrine, nutritional and metabolic disease: Secondary | ICD-10-CM | POA: Diagnosis not present

## 2017-11-25 DIAGNOSIS — M170 Bilateral primary osteoarthritis of knee: Secondary | ICD-10-CM | POA: Diagnosis not present

## 2017-11-25 DIAGNOSIS — M19042 Primary osteoarthritis, left hand: Secondary | ICD-10-CM

## 2017-11-25 NOTE — Patient Instructions (Signed)
Standing Labs We placed an order today for your standing lab work.    Please come back and get your standing labs in July and every 3 months   We have open lab Monday through Friday from 8:30-11:30 AM and 1:30-4:00 PM  at the office of Dr. Danisha Brassfield.   You may experience shorter wait times on Monday and Friday afternoons. The office is located at [address], [address], [place], [place] [address] No appointment is necessary.   Labs are drawn by [facility].  You may receive a bill from [facility] for your lab work. If you have any questions regarding directions or hours of operation,  please call [phone].    

## 2017-12-23 ENCOUNTER — Telehealth: Payer: Self-pay | Admitting: Rheumatology

## 2017-12-23 MED ORDER — HYDROXYCHLOROQUINE SULFATE 200 MG PO TABS: ORAL_TABLET | ORAL | 0 refills | Status: DC

## 2017-12-23 NOTE — Telephone Encounter (Signed)
Last visit: 11/25/17 Next visit: 04/26/18 Labs: 11/09/17 CO2 elevated. All other labs are WNL. PLQ Eye Exam: 01/16/17 WNL   Okay to refill per Dr. Estanislado Pandy

## 2017-12-23 NOTE — Telephone Encounter (Signed)
Patient going out of town, and needs a refill on Plaquenil sent to [facility] on [address]. Please call patient with any questions, or concerns.

## 2018-01-27 ENCOUNTER — Other Ambulatory Visit: Payer: Self-pay | Admitting: Gynecology

## 2018-01-27 DIAGNOSIS — Z780 Asymptomatic menopausal state: Secondary | ICD-10-CM

## 2018-02-01 ENCOUNTER — Ambulatory Visit (INDEPENDENT_AMBULATORY_CARE_PROVIDER_SITE_OTHER): Payer: PPO | Admitting: Gynecology

## 2018-02-01 ENCOUNTER — Encounter: Payer: Self-pay | Admitting: Gynecology

## 2018-02-01 VITALS — BP 118/76 | Ht 66.0 in | Wt 130.0 lb

## 2018-02-01 DIAGNOSIS — Z9189 Other specified personal risk factors, not elsewhere classified: Secondary | ICD-10-CM | POA: Diagnosis not present

## 2018-02-01 DIAGNOSIS — M818 Other osteoporosis without current pathological fracture: Secondary | ICD-10-CM

## 2018-02-01 DIAGNOSIS — Z01419 Encounter for gynecological examination (general) (routine) without abnormal findings: Secondary | ICD-10-CM | POA: Diagnosis not present

## 2018-02-01 DIAGNOSIS — Z124 Encounter for screening for malignant neoplasm of cervix: Secondary | ICD-10-CM | POA: Diagnosis not present

## 2018-02-01 DIAGNOSIS — N952 Postmenopausal atrophic vaginitis: Secondary | ICD-10-CM

## 2018-02-01 NOTE — Patient Instructions (Signed)
Followup for bone density as scheduled. 

## 2018-02-01 NOTE — Addendum Note (Signed)
Addended by: Nelva Nay on: 02/01/2018 10:13 AM   Modules accepted: Orders

## 2018-02-01 NOTE — Progress Notes (Signed)
    Shabrea Weldin May 12, 1951 [id number]        67 y.o.  G2P2002 for breast and pelvic exam.  Past medical history,surgical history, problem list, medications, allergies, family history and social history were all reviewed and documented as reviewed in the EPIC chart.  ROS:  Performed with pertinent positives and negatives included in the history, assessment and plan.   Additional significant findings : None   Exam: Caryn Bee assistant Vitals:   02/01/18 0916  BP: 118/76  Weight: 130 lb (59 kg)  Height: 5\' 6"  (1.676 m)   Body mass index is 20.98 kg/m.  General appearance:  Normal affect, orientation and appearance. Skin: Grossly normal HEENT: Without gross lesions.  No cervical or supraclavicular adenopathy. Thyroid normal.  Lungs:  Clear without wheezing, rales or rhonchi Cardiac: RR, without RMG Abdominal:  Soft, nontender, without masses, guarding, rebound, organomegaly or hernia Breasts:  Examined lying and sitting without masses, retractions, discharge or axillary adenopathy. Pelvic:  Ext, BUS, Vagina: With atrophic changes  Cervix: With atrophic changes.  Pap smear done  Uterus: Anteverted, normal size, shape and contour, midline and mobile nontender   Adnexa: Without masses or tenderness    Anus and perineum: Normal   Rectovaginal: Normal sphincter tone without palpated masses or tenderness.    Assessment/Plan:  67 y.o. G50P2002 female for breast and pelvic exam.   1. Postmenopausal.  No significant menopausal symptoms or any vaginal bleeding. 2. Osteoporosis based on hip fracture.  DEXA 2016 T score -2.4 left forearm.  Stable and hip and spine.  Was started on Fosamax for approximately 2 years but discontinued.  She was started by her orthopedic surgeon who told her to take it for 1 to 2 years.  Has DEXA scheduled next week.  We will follow-up for this.  We will further discuss based on DEXA results.  I did discuss with her that usually we keep patients on  bisphosphate's for 5+ years and the issue as to whether such a short course would be of long-term benefit. 3. Mammography overdue and I reminded patient to schedule when she agrees to call and schedule.  Breast exam normal today. 4. Pap smear/HPV 2015.  Pap smear done today.  No history of significant abnormal Pap smear.  Reviewed current screening guidelines and options to stop screening based on age reviewed.  Will readdress on an annual basis. 5. Colonoscopy 2018.  Repeat at their recommended interval. 6. Health maintenance.  No routine lab work done as patient reports this done elsewhere.  Follow-up for bone density.  Follow-up for annual exam in 1 year.   [name] MD, 9:57 AM 02/01/2018

## 2018-02-02 LAB — PAP IG W/ RFLX HPV ASCU

## 2018-02-04 ENCOUNTER — Telehealth: Payer: Self-pay | Admitting: Rheumatology

## 2018-02-04 NOTE — Telephone Encounter (Signed)
Patient called stating she will come to the office on 02/11/18 to get her labwork done.  Patient states she is also scheduling an appointment with the eye doctor for her Plaquenil eye exam in August.

## 2018-02-05 ENCOUNTER — Other Ambulatory Visit: Payer: Self-pay | Admitting: Rheumatology

## 2018-02-05 NOTE — Telephone Encounter (Signed)
Last Visit: 11/25/17 Next Visit: 05/07/18 Labs: 11/09/17 CO2 elevated. All other labs are WNL.  Okay to refill per Dr. Estanislado Pandy

## 2018-02-08 ENCOUNTER — Other Ambulatory Visit: Payer: Self-pay | Admitting: Gynecology

## 2018-02-08 ENCOUNTER — Telehealth: Payer: Self-pay | Admitting: Gynecology

## 2018-02-08 ENCOUNTER — Encounter: Payer: Self-pay | Admitting: Gynecology

## 2018-02-08 ENCOUNTER — Ambulatory Visit (INDEPENDENT_AMBULATORY_CARE_PROVIDER_SITE_OTHER): Payer: PPO

## 2018-02-08 DIAGNOSIS — Z780 Asymptomatic menopausal state: Secondary | ICD-10-CM

## 2018-02-08 DIAGNOSIS — M810 Age-related osteoporosis without current pathological fracture: Secondary | ICD-10-CM | POA: Diagnosis not present

## 2018-02-08 NOTE — Telephone Encounter (Signed)
Patient that her bone density does show significant loss that her left hip on her prior study.  I would recommend starting back on Fosamax 70 mg weekly and see plan on staying on this for maybe 5 years.  We will repeat her bone density in 2 years.  Risks of Fosamax include GI upset, osteonecrosis of the jaw were gums received and atypical fractures where patients will have femoral fractures after particularly prolonged use.  I think if she did well with Fosamax previously that we should go ahead and restart this now.  If she has any issues/questions and recommend office visit to discuss.

## 2018-02-11 ENCOUNTER — Encounter: Payer: Self-pay | Admitting: Anesthesiology

## 2018-02-11 DIAGNOSIS — Z1231 Encounter for screening mammogram for malignant neoplasm of breast: Secondary | ICD-10-CM | POA: Diagnosis not present

## 2018-02-11 NOTE — Telephone Encounter (Signed)
Patient called in voice mail. Called her back and left message to call me.

## 2018-02-11 NOTE — Telephone Encounter (Signed)
Patient asked if you now consider her with osteoporosis and what amount of loss?  Also, she wondered about her spine? (she said tech told her she had gained in her spine.)

## 2018-02-11 NOTE — Telephone Encounter (Signed)
Left message to call me.

## 2018-02-12 NOTE — Telephone Encounter (Signed)
Recommend office visit so I can go over the entire report with her.  Easier done in person

## 2018-02-12 NOTE — Telephone Encounter (Signed)
Left detailed message on pt voicemail to schedule visit

## 2018-02-17 DIAGNOSIS — H2513 Age-related nuclear cataract, bilateral: Secondary | ICD-10-CM | POA: Diagnosis not present

## 2018-02-24 ENCOUNTER — Other Ambulatory Visit: Payer: Self-pay | Admitting: Rheumatology

## 2018-03-08 ENCOUNTER — Other Ambulatory Visit: Payer: Self-pay

## 2018-03-08 DIAGNOSIS — Z79899 Other long term (current) drug therapy: Secondary | ICD-10-CM | POA: Diagnosis not present

## 2018-03-09 ENCOUNTER — Other Ambulatory Visit: Payer: Self-pay | Admitting: Rheumatology

## 2018-03-09 DIAGNOSIS — Z79899 Other long term (current) drug therapy: Secondary | ICD-10-CM

## 2018-03-09 LAB — CBC WITH DIFFERENTIAL/PLATELET
BASOS PCT: 1.3 %
Basophils Absolute: 92 cells/uL (ref 0–200)
Eosinophils Absolute: 341 cells/uL (ref 15–500)
Eosinophils Relative: 4.8 %
HCT: 36.7 % (ref 35.0–45.0)
Hemoglobin: 12.3 g/dL (ref 11.7–15.5)
LYMPHS ABS: 1995 {cells}/uL (ref 850–3900)
MCH: 31.2 pg (ref 27.0–33.0)
MCHC: 33.5 g/dL (ref 32.0–36.0)
MCV: 93.1 fL (ref 80.0–100.0)
MPV: 10.2 fL (ref 7.5–12.5)
Monocytes Relative: 7.9 %
Neutro Abs: 4111 cells/uL (ref 1500–7800)
Neutrophils Relative %: 57.9 %
PLATELETS: 286 10*3/uL (ref 140–400)
RBC: 3.94 10*6/uL (ref 3.80–5.10)
RDW: 11.8 % (ref 11.0–15.0)
TOTAL LYMPHOCYTE: 28.1 %
WBC: 7.1 10*3/uL (ref 3.8–10.8)
WBCMIX: 561 {cells}/uL (ref 200–950)

## 2018-03-09 LAB — COMPLETE METABOLIC PANEL WITH GFR
AG RATIO: 1.7 (calc) (ref 1.0–2.5)
ALKALINE PHOSPHATASE (APISO): 69 U/L (ref 33–130)
ALT: 7 U/L (ref 6–29)
AST: 21 U/L (ref 10–35)
Albumin: 4.3 g/dL (ref 3.6–5.1)
BUN/Creatinine Ratio: 16 (calc) (ref 6–22)
BUN: 20 mg/dL (ref 7–25)
CALCIUM: 10 mg/dL (ref 8.6–10.4)
CO2: 34 mmol/L — AB (ref 20–32)
Chloride: 102 mmol/L (ref 98–110)
Creat: 1.22 mg/dL — ABNORMAL HIGH (ref 0.50–0.99)
GFR, EST NON AFRICAN AMERICAN: 45 mL/min/{1.73_m2} — AB (ref >=60)
GFR, Est African American: 53 mL/min/{1.73_m2} — ABNORMAL LOW (ref >=60)
Globulin: 2.5 g/dL (calc) (ref 1.9–3.7)
Glucose, Bld: 96 mg/dL (ref 65–99)
POTASSIUM: 4.1 mmol/L (ref 3.5–5.3)
SODIUM: 143 mmol/L (ref 135–146)
Total Bilirubin: 0.5 mg/dL (ref 0.2–1.2)
Total Protein: 6.8 g/dL (ref 6.1–8.1)

## 2018-03-09 NOTE — Progress Notes (Signed)
I called patient regarding the lab results.  She states she is not taking any NSAIDs, diuretics or antibiotics.  She is on Arava 20 mg a day and Plaquenil 300 mg a day.  I have advised her to reduce Arava to 10 mg p.o. daily.  She will come in in a month to get BMP.

## 2018-03-23 ENCOUNTER — Telehealth: Payer: Self-pay | Admitting: Rheumatology

## 2018-03-23 MED ORDER — HYDROXYCHLOROQUINE SULFATE 200 MG PO TABS: ORAL_TABLET | ORAL | 0 refills | Status: DC

## 2018-03-23 NOTE — Telephone Encounter (Signed)
Last Visit: 11/25/17 Next Visit: 05/07/18 Labs: 11/09/17 CO2 elevated. All other labs are WNL. PLQ Eye exam: 02/17/18 WNL   Okay to refill per Dr. Estanislado Pandy

## 2018-03-23 NOTE — Telephone Encounter (Signed)
Patient left a voicemail requesting prescription refill of Plaquenil to be sent to [facility] on [address] in [place].

## 2018-04-09 ENCOUNTER — Other Ambulatory Visit: Payer: Self-pay | Admitting: *Deleted

## 2018-04-09 DIAGNOSIS — Z79899 Other long term (current) drug therapy: Secondary | ICD-10-CM | POA: Diagnosis not present

## 2018-04-09 LAB — BASIC METABOLIC PANEL WITH GFR
BUN/Creatinine Ratio: 17 (calc) (ref 6–22)
BUN: 17 mg/dL (ref 7–25)
CO2: 32 mmol/L (ref 20–32)
CREATININE: 1.02 mg/dL — AB (ref 0.50–0.99)
Calcium: 10 mg/dL (ref 8.6–10.4)
Chloride: 103 mmol/L (ref 98–110)
GFR, EST NON AFRICAN AMERICAN: 56 mL/min/{1.73_m2} — AB (ref >=60)
GFR, Est African American: 65 mL/min/{1.73_m2} (ref >=60)
GLUCOSE: 130 mg/dL — AB (ref 65–99)
Potassium: 4.2 mmol/L (ref 3.5–5.3)
Sodium: 143 mmol/L (ref 135–146)

## 2018-04-12 NOTE — Progress Notes (Signed)
Creatinine is better.  She should continue on Arava 10 mg p.o. daily.

## 2018-04-23 NOTE — Progress Notes (Signed)
Office Visit Note  Patient: [name] [name]             Date of Birth: [birth year]           MRN: [id number]             PCP: Levin Erp, MD Referring: Levin Erp, MD Visit Date: 05/07/2018 Occupation: _0 @  Subjective:  Medication management.   History of Present Illness: Sherlonda Flater is a 67 y.o. female with history of sero positive rheumatoid arthritis and osteoarthritis.  She states she has been doing fairly well.  Her dose of Areva was reduced from 20 mg to 10 mg p.o. daily about 2 months ago due to elevation in creatinine.  She states she has felt some twinges of discomfort in her hands but no joint swelling since then.  She also has underlying osteoarthritis in her hands and knee joints.  She states she is very active and has been gardening and doing all routine activities without a flare.  Activities of Daily Living:  Patient reports morning stiffness for 0 minutes.   Patient Denies nocturnal pain.  Difficulty dressing/grooming: Denies Difficulty climbing stairs: Denies Difficulty getting out of chair: Denies Difficulty using hands for taps, buttons, cutlery, and/or writing: Denies  Review of Systems  Constitutional: Negative for fatigue.  HENT: Negative for mouth sores, trouble swallowing, trouble swallowing and mouth dryness.   Eyes: Negative for pain, redness, itching and dryness.  Respiratory: Negative for shortness of breath, wheezing and difficulty breathing.   Cardiovascular: Negative for chest pain, palpitations and swelling in legs/feet.  Gastrointestinal: Negative for abdominal pain, constipation, diarrhea, nausea and vomiting.  Endocrine: Negative for increased urination.  Genitourinary: Negative for painful urination, nocturia and pelvic pain.  Musculoskeletal: Negative for arthralgias, joint pain, joint swelling and morning stiffness.  Skin: Negative for rash and hair loss.  Allergic/Immunologic: Negative for susceptible to infections.  Neurological:  Negative for dizziness, light-headedness, headaches and memory loss.  Hematological: Negative for bruising/bleeding tendency.  Psychiatric/Behavioral: Negative for confusion. The patient is not nervous/anxious.     PMFS History:  Patient Active Problem List   Diagnosis Date Noted  . Primary osteoarthritis of both knees 01/08/2017  . Primary osteoarthritis of both feet 01/08/2017  . History of pleural effusion 12/01/2016  . History of recurrent pneumonia 12/01/2016  . History of hyperlipidemia 12/01/2016  . High risk medication use 12/01/2016  . Trigger thumb, left thumb 12/01/2016  . Elevated sed rate 12/01/2016  . History of fracture of right hip 12/01/2016  . Primary osteoarthritis of both hands 12/01/2016  . Rheumatoid arthritis involving multiple joints ([place]) 04/09/2015  . HCAP (healthcare-associated pneumonia)   . Paroxysmal atrial fibrillation (HCC)   . Tendonitis 04/03/2015  . Pleural effusion associated with pulmonary infection 03/27/2015  . Orthostatic syncope   . Syncope 03/18/2015  . Osteoporosis   . Hyperlipidemia 11/14/2013    Past Medical History:  Diagnosis Date  . Elevated cholesterol   . Migraines    occular  . Osteoporosis 2015   based on hip fracture from fall  DEXA 01/2018 T score -2.3  . Pleural effusion 9/206   Bilateral effusions, s/p thora on R c/w exudate  . Pneumonia 03/2015   R  . RA (rheumatoid arthritis) ([place])   . Skin cancer (melanoma) (HCC)    Right Arm, nose.     Family History  Problem Relation Age of Onset  . Heart attack Father 81  . Heart disease Father   . Osteoporosis Sister   .  High Cholesterol Sister   . Melanoma Daughter   . Melanoma Daughter    Past Surgical History:  Procedure Laterality Date  . COLONOSCOPY WITH PROPOFOL N/A 11/04/2016   Procedure: COLONOSCOPY WITH PROPOFOL;  Surgeon: Garlan Fair, MD;  Location: [facility];  Service: Endoscopy;  Laterality: N/A;  . FINGER SURGERY    . HIP ARTHROPLASTY Right  11/14/2013   Procedure: RIGHT CANNULATED HIP PINNING;  Surgeon: Alta Corning, MD;  Location: [facility];  Service: Orthopedics;  Laterality: Right;  BIOMET  . 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 She appears well-developed and well-nourished.  HENT:  Head: Normocephalic and atraumatic.  Eyes: Conjunctivae and EOM are normal.  Neck: Normal range of motion.  Cardiovascular: Normal rate, regular rhythm, normal heart sounds and intact distal pulses.  Pulmonary/Chest: Effort normal and breath sounds normal.  Abdominal: Soft. Bowel sounds are normal.  Lymphadenopathy:    She has no cervical adenopathy.  Neurological: She is alert and oriented to person, place, and time.  Skin: Skin is warm and dry. Capillary refill takes less than 2 seconds.  Psychiatric: She has a normal mood and affect. Her behavior is normal.  Nursing note and vitals reviewed.    Musculoskeletal Exam: C-spine thoracic lumbar spine good range of motion.  Shoulder joints elbow joints wrist joint MCPs PIPs DIPs were in good range of motion.  She has PIP and DIP thickening but no synovitis.  Hip joints knee joints ankles MTPs PIPs DIPs were in good range of motion with no synovitis.  CDAI Exam: CDAI Score: 0.2  Patient Global Assessment: 1 (mm); Provider Global Assessment: 1 (mm) Swollen: 0 ;  Tender: 0  Joint Exam   Not documented   There is currently no information documented on the homunculus. Go to the Rheumatology activity and complete the homunculus joint exam.  Investigation: No additional findings.  Imaging: No results found.  Recent Labs: Lab Results  Component Value Date   WBC 7.1 03/08/2018   HGB 12.3 03/08/2018   PLT 286 03/08/2018   NA 143 04/09/2018   K 4.2 04/09/2018   CL 103 04/09/2018   CO2 32 04/09/2018   GLUCOSE 130 (H) 04/09/2018   BUN 17 04/09/2018   CREATININE 1.02 (H) 04/09/2018   BILITOT 0.5 03/08/2018   ALKPHOS 123 01/08/2017   AST 21 03/08/2018   ALT 7 03/08/2018   PROT 6.8 03/08/2018   ALBUMIN 4.2 01/08/2017   CALCIUM 10.0 04/09/2018   GFRAA 65 04/09/2018   BMP done on 9/27.  Put in order for hepatic function. Speciality Comments: PLQ Eye exam: 02/17/18 WNL @ [facility] Follow up in 1 year  Procedures:  No procedures performed Allergies: Levaquin [levofloxacin] and Cephalexin   Assessment / Plan:  Visit Diagnoses: Rheumatoid arthritis involving multiple joints (HCC) - Positive RF, positive anti-CCP history of elevated ESR ANA negative.  Patient reports intermittent twinges but no flares of rheumatoid arthritis.  We had to reduce her Arava dose to 10 mg p.o. daily due to elevation of creatinine.  She has been doing well on reduced dose of Arava.  We will continue to monitor.  High risk medication use - Arava 10 mg po qd, PLQ 200 mg BID M-F.eye exam: 02/17/2018.  Her labs are due in December and then every 3 months to monitor for drug toxicity.  Her creatinine was increased.  We will continue to monitor that.  Primary osteoarthritis of both hands-she has DIP and PIP thickening.  Joint protection muscle strengthening was discussed.  Primary osteoarthritis of both knees - Bilateral moderate with moderate chondromalacia patella.  She is currently not having much discomfort in her knee joints.  [place]  Primary osteoarthritis of both feet-proper fitting shoes has been helpful.  Osteopenia of multiple sites - DXA 10/16.  She is on calcium and vitamin D and doing resistive exercises.Patient reports that she took Fosamax for couple of years after having the right hip f  History of fracture of right hip  History of bronchiectasis-she is followed up by Dr. Lake Bells.  Paroxysmal atrial fibrillation (HCC)  History of hyperlipidemia  History of pleural effusion - Dr. Lake Bells.   Orders: No orders of the defined types were placed in this encounter.  No orders of the defined types were placed in this encounter.   Face-to-face time spent with patient was 30 minutes. Greater than 50% of time was spent in counseling and coordination of care.  Follow-Up Instructions: Return in about 5 months (around 10/06/2018) for Rheumatoid arthritis, Osteoarthritis.   Bo Merino, MD  Note - This record has been created using Editor, commissioning.  Chart creation errors have been sought, but may not always  have been located. Such creation errors do not reflect on  the standard of medical care.

## 2018-04-26 ENCOUNTER — Ambulatory Visit: Payer: PPO | Admitting: Rheumatology

## 2018-05-07 ENCOUNTER — Ambulatory Visit: Payer: PPO | Admitting: Rheumatology

## 2018-05-07 ENCOUNTER — Encounter: Payer: Self-pay | Admitting: Rheumatology

## 2018-05-07 VITALS — BP 103/63 | HR 78 | Resp 13 | Ht 66.5 in | Wt 130.2 lb

## 2018-05-07 DIAGNOSIS — M19071 Primary osteoarthritis, right ankle and foot: Secondary | ICD-10-CM | POA: Diagnosis not present

## 2018-05-07 DIAGNOSIS — Z79899 Other long term (current) drug therapy: Secondary | ICD-10-CM

## 2018-05-07 DIAGNOSIS — M19042 Primary osteoarthritis, left hand: Secondary | ICD-10-CM | POA: Diagnosis not present

## 2018-05-07 DIAGNOSIS — M19072 Primary osteoarthritis, left ankle and foot: Secondary | ICD-10-CM | POA: Diagnosis not present

## 2018-05-07 DIAGNOSIS — M069 Rheumatoid arthritis, unspecified: Secondary | ICD-10-CM

## 2018-05-07 DIAGNOSIS — I480 Paroxysmal atrial fibrillation: Secondary | ICD-10-CM

## 2018-05-07 DIAGNOSIS — Z8639 Personal history of other endocrine, nutritional and metabolic disease: Secondary | ICD-10-CM

## 2018-05-07 DIAGNOSIS — Z8709 Personal history of other diseases of the respiratory system: Secondary | ICD-10-CM

## 2018-05-07 DIAGNOSIS — M170 Bilateral primary osteoarthritis of knee: Secondary | ICD-10-CM

## 2018-05-07 DIAGNOSIS — M19041 Primary osteoarthritis, right hand: Secondary | ICD-10-CM | POA: Diagnosis not present

## 2018-05-07 DIAGNOSIS — M8589 Other specified disorders of bone density and structure, multiple sites: Secondary | ICD-10-CM | POA: Diagnosis not present

## 2018-05-07 DIAGNOSIS — Z8781 Personal history of (healed) traumatic fracture: Secondary | ICD-10-CM

## 2018-05-07 NOTE — Patient Instructions (Signed)
Standing Labs We placed an order today for your standing lab work.    Please come back and get your standing labs in December and every 3 months   We have open lab Monday through Friday from 8:30-11:30 AM and 1:30-4:00 PM  at the office of Dr. Cuyler Vandyken.   You may experience shorter wait times on Monday and Friday afternoons. The office is located at [address], [address], [place], [place] [address] No appointment is necessary.   Labs are drawn by [facility].  You may receive a bill from [facility] for your lab work. If you have any questions regarding directions or hours of operation,  please call [phone].   Just as a reminder please drink plenty of water prior to coming for your lab work. Thanks!   

## 2018-05-24 DIAGNOSIS — H43813 Vitreous degeneration, bilateral: Secondary | ICD-10-CM | POA: Diagnosis not present

## 2018-05-25 ENCOUNTER — Other Ambulatory Visit: Payer: Self-pay | Admitting: Rheumatology

## 2018-05-25 MED ORDER — LEFLUNOMIDE 10 MG PO TABS: 10.0000 mg | ORAL_TABLET | Freq: Every day | ORAL | 0 refills | Status: DC

## 2018-05-25 NOTE — Telephone Encounter (Signed)
Last Visit: 05/07/18 Next Visit: 10/04/18 Labs: 03/08/18 creat. 1.22 GFR 45, previously 0.77 GFR 79 BMP creat improved  Okay to refill per Dr. Estanislado Pandy

## 2018-06-07 DIAGNOSIS — J029 Acute pharyngitis, unspecified: Secondary | ICD-10-CM | POA: Diagnosis not present

## 2018-06-14 ENCOUNTER — Telehealth: Payer: Self-pay | Admitting: Rheumatology

## 2018-06-14 ENCOUNTER — Other Ambulatory Visit: Payer: Self-pay | Admitting: Rheumatology

## 2018-06-14 DIAGNOSIS — H43813 Vitreous degeneration, bilateral: Secondary | ICD-10-CM | POA: Diagnosis not present

## 2018-06-14 MED ORDER — HYDROXYCHLOROQUINE SULFATE 200 MG PO TABS: ORAL_TABLET | ORAL | 0 refills | Status: DC

## 2018-06-14 NOTE — Telephone Encounter (Signed)
Patient called requesting prescription refill of Plaquenil to be sent to [facility] on [address].

## 2018-06-14 NOTE — Telephone Encounter (Signed)
Last Visit: 05/07/18 Next Visit: 10/04/18 Labs: 03/08/18 creat. 1.22 GFR 45, previously 0.77 GFR 79 BMP creat improved PLQ Eye exam: 02/17/18 WNL   Okay to refill per Dr. Estanislado Pandy

## 2018-07-02 ENCOUNTER — Telehealth: Payer: Self-pay | Admitting: Rheumatology

## 2018-07-02 NOTE — Telephone Encounter (Signed)
Patient advised we sent a 90 supply in to the pharmacy in 05/25/18. Patient will contact the pharmacy. Patient reminded she is due to update labs.

## 2018-07-02 NOTE — Telephone Encounter (Signed)
Patient called requesting prescription refill of Arava (3 month supply) sent to [facility] on [address] in [place].

## 2018-07-12 ENCOUNTER — Other Ambulatory Visit: Payer: Self-pay | Admitting: *Deleted

## 2018-07-12 DIAGNOSIS — Z79899 Other long term (current) drug therapy: Secondary | ICD-10-CM | POA: Diagnosis not present

## 2018-07-12 DIAGNOSIS — E785 Hyperlipidemia, unspecified: Secondary | ICD-10-CM | POA: Diagnosis not present

## 2018-07-12 LAB — COMPLETE METABOLIC PANEL WITH GFR
AG Ratio: 1.8 (calc) (ref 1.0–2.5)
ALT: 8 U/L (ref 6–29)
AST: 23 U/L (ref 10–35)
Albumin: 4.1 g/dL (ref 3.6–5.1)
Alkaline phosphatase (APISO): 74 U/L (ref 33–130)
BUN: 13 mg/dL (ref 7–25)
CALCIUM: 9.8 mg/dL (ref 8.6–10.4)
CO2: 31 mmol/L (ref 20–32)
CREATININE: 0.8 mg/dL (ref 0.50–0.99)
Chloride: 103 mmol/L (ref 98–110)
GFR, EST NON AFRICAN AMERICAN: 75 mL/min/{1.73_m2} (ref >=60)
GFR, Est African American: 87 mL/min/{1.73_m2} (ref >=60)
GLOBULIN: 2.3 g/dL (ref 1.9–3.7)
Glucose, Bld: 82 mg/dL (ref 65–99)
Potassium: 4.4 mmol/L (ref 3.5–5.3)
SODIUM: 141 mmol/L (ref 135–146)
TOTAL PROTEIN: 6.4 g/dL (ref 6.1–8.1)
Total Bilirubin: 0.5 mg/dL (ref 0.2–1.2)

## 2018-07-12 LAB — LIPID PANEL
Cholesterol: 229 mg/dL — ABNORMAL HIGH (ref <200)
HDL: 86 mg/dL (ref >50)
LDL Cholesterol (Calc): 122 mg/dL (calc) — ABNORMAL HIGH
Non-HDL Cholesterol (Calc): 143 mg/dL (calc) — ABNORMAL HIGH (ref <130)
Total CHOL/HDL Ratio: 2.7 (calc) (ref <5.0)
Triglycerides: 99 mg/dL (ref <150)

## 2018-07-12 LAB — CBC WITH DIFFERENTIAL/PLATELET
ABSOLUTE MONOCYTES: 462 {cells}/uL (ref 200–950)
BASOS PCT: 1.1 %
Basophils Absolute: 72 cells/uL (ref 0–200)
EOS ABS: 325 {cells}/uL (ref 15–500)
Eosinophils Relative: 5 %
HCT: 37.5 % (ref 35.0–45.0)
HEMOGLOBIN: 12.6 g/dL (ref 11.7–15.5)
Lymphs Abs: 1508 cells/uL (ref 850–3900)
MCH: 32.1 pg (ref 27.0–33.0)
MCHC: 33.6 g/dL (ref 32.0–36.0)
MCV: 95.4 fL (ref 80.0–100.0)
MPV: 10.1 fL (ref 7.5–12.5)
Monocytes Relative: 7.1 %
NEUTROS ABS: 4134 {cells}/uL (ref 1500–7800)
Neutrophils Relative %: 63.6 %
Platelets: 279 10*3/uL (ref 140–400)
RBC: 3.93 10*6/uL (ref 3.80–5.10)
RDW: 11.6 % (ref 11.0–15.0)
TOTAL LYMPHOCYTE: 23.2 %
WBC: 6.5 10*3/uL (ref 3.8–10.8)

## 2018-07-26 ENCOUNTER — Telehealth: Payer: Self-pay | Admitting: Rheumatology

## 2018-07-26 DIAGNOSIS — R079 Chest pain, unspecified: Secondary | ICD-10-CM | POA: Diagnosis not present

## 2018-07-26 DIAGNOSIS — M810 Age-related osteoporosis without current pathological fracture: Secondary | ICD-10-CM | POA: Diagnosis not present

## 2018-07-26 DIAGNOSIS — C4490 Unspecified malignant neoplasm of skin, unspecified: Secondary | ICD-10-CM | POA: Diagnosis not present

## 2018-07-26 DIAGNOSIS — C439 Malignant melanoma of skin, unspecified: Secondary | ICD-10-CM | POA: Diagnosis not present

## 2018-07-26 DIAGNOSIS — M069 Rheumatoid arthritis, unspecified: Secondary | ICD-10-CM | POA: Diagnosis not present

## 2018-07-26 NOTE — Telephone Encounter (Signed)
Nurse requesting labs to be faxed on patient. Patient there now for a physical. They added Cholesterol panel to ours. Fax# [phone]

## 2018-07-26 NOTE — Telephone Encounter (Signed)
Labs faxed to PCP

## 2018-09-03 ENCOUNTER — Telehealth: Payer: Self-pay | Admitting: *Deleted

## 2018-09-03 NOTE — Telephone Encounter (Signed)
Patient advised there is no drug interaction between Fosamax Arava and Plaquenil. Patient verbalized understanding.

## 2018-09-03 NOTE — Telephone Encounter (Signed)
Patient states she was seen by her PCP and advised that her bone density  scan shows that it would beneficial for her to start Fosamax. Patient states that her PCP would like to know to if the contraindications with the current medications she is on for her RA that would prevent her from taking the Fosamax. Patient is currently on [place] and [place].

## 2018-09-03 NOTE — Telephone Encounter (Signed)
There is no drug interaction between Fosamax Arava and Plaquenil.

## 2018-09-09 ENCOUNTER — Telehealth: Payer: Self-pay | Admitting: Rheumatology

## 2018-09-09 NOTE — Telephone Encounter (Signed)
Patient left a voicemail requesting prescription refill of Plaquenil 90 day supply to be sent to [facility].

## 2018-09-10 MED ORDER — HYDROXYCHLOROQUINE SULFATE 200 MG PO TABS: ORAL_TABLET | ORAL | 0 refills | Status: DC

## 2018-09-10 NOTE — Telephone Encounter (Signed)
Last Visit: 05/07/18 Next Visit: 10/04/18 Labs: 07/12/18 WNL PLQ Eye exam: 02/17/18 WNL   Okay to refill per Dr. Estanislado Pandy

## 2018-09-17 DIAGNOSIS — R51 Headache: Secondary | ICD-10-CM | POA: Diagnosis not present

## 2018-09-17 DIAGNOSIS — R05 Cough: Secondary | ICD-10-CM | POA: Diagnosis not present

## 2018-09-17 DIAGNOSIS — J111 Influenza due to unidentified influenza virus with other respiratory manifestations: Secondary | ICD-10-CM | POA: Diagnosis not present

## 2018-09-20 NOTE — Progress Notes (Signed)
 Office Visit Note  Patient: [name] [name]             Date of Birth: 04/10/1951           MRN: [id number]             PCP: Green, Edwin, MD Referring: Green, Edwin, MD Visit Date: 10/04/2018 Occupation: @GUAROCC@  Subjective:  Left IT band syndrome    History of Present Illness: [name] [name] is a 67 y.o. female with history of seropositive rheumatoid arthritis and osteoarthritis. She is taking Arava 10 mg po daily and PLQ 200 mg BID M-F.  She denies any recent flares.  She denies missing any doses of Arava or PLQ recently.  She states that 3 weeks ago she was diagnosed with the flu and she was started on Xofluza.  She has been asymptomatic for 2 weeks.  She did not hold her dose of Arava while sick.  She states she has very mild and fleeting pain in both hands and both wrist joints.  She denies any joint swelling.  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  She states she has been having some intermittent lateral left knee joint pain.  She states she does not have pain with ROM and denies any joint swelling.  Activities of Daily Living:  Patient reports morning stiffness for 0 minutes.   Patient Denies nocturnal pain.  Difficulty dressing/grooming: Denies Difficulty climbing stairs: Denies Difficulty getting out of chair: Denies Difficulty using hands for taps, buttons, cutlery, and/or writing: Denies  Review of Systems  Constitutional: Negative for fatigue.  HENT: Negative for mouth sores, mouth dryness and nose dryness.   Eyes: Negative for pain, visual disturbance and dryness.  Respiratory: Negative for cough, hemoptysis, shortness of breath and difficulty breathing.   Cardiovascular: Negative for chest pain, palpitations, hypertension and swelling in legs/feet.  Gastrointestinal: Negative for blood in stool, constipation and diarrhea.  Endocrine: Negative for increased urination.  Genitourinary: Negative for painful urination.    Musculoskeletal: Negative for arthralgias, joint pain, joint swelling, myalgias, muscle weakness, morning stiffness, muscle tenderness and myalgias.  Skin: Negative for color change, pallor, rash, hair loss, nodules/bumps, skin tightness, ulcers and sensitivity to sunlight.  Neurological: Negative for dizziness, numbness, headaches and weakness.  Hematological: Negative for swollen glands.  Psychiatric/Behavioral: Negative for depressed mood and sleep disturbance. The patient is not nervous/anxious.     PMFS History:  Patient Active Problem List   Diagnosis Date Noted  . Primary osteoarthritis of both knees 01/08/2017  . Primary osteoarthritis of both feet 01/08/2017  . History of pleural effusion 12/01/2016  . History of recurrent pneumonia 12/01/2016  . History of hyperlipidemia 12/01/2016  . High risk medication use 12/01/2016  . Trigger thumb, left thumb 12/01/2016  . Elevated sed rate 12/01/2016  . History of fracture of right hip 12/01/2016  . Primary osteoarthritis of both hands 12/01/2016  . Rheumatoid arthritis involving multiple joints (HCC) 04/09/2015  . HCAP (healthcare-associated pneumonia)   . Paroxysmal atrial fibrillation (HCC)   . Tendonitis 04/03/2015  . Pleural effusion associated with pulmonary infection 03/27/2015  . Orthostatic syncope   . Syncope 03/18/2015  . Osteoporosis   . Hyperlipidemia 11/14/2013    Past Medical History:  Diagnosis Date  . Elevated cholesterol   . Migraines    occular  . Osteoporosis 2015   based on hip fracture from fall  DEXA 01/2018 T score -2.3  . Pleural effusion   9/206   Bilateral effusions, s/p thora on R c/w exudate  . Pneumonia 03/2015   R  . RA (rheumatoid arthritis) ([place])   . Skin cancer (melanoma) (HCC)    Right Arm, nose.     Family History  Problem Relation Age of Onset  . Heart attack Father 60  . Heart disease Father   . Osteoporosis Sister   . High Cholesterol Sister   . Melanoma Daughter   . Melanoma  Daughter    Past Surgical History:  Procedure Laterality Date  . COLONOSCOPY WITH PROPOFOL N/A 11/04/2016   Procedure: COLONOSCOPY WITH PROPOFOL;  Surgeon: Garlan Fair, MD;  Location: [facility];  Service: Endoscopy;  Laterality: N/A;  . FINGER SURGERY    . HIP ARTHROPLASTY Right 11/14/2013   Procedure: RIGHT CANNULATED HIP PINNING;  Surgeon: Alta Corning, MD;  Location: [facility];  Service: Orthopedics;  Laterality: Right;  BIOMET  . 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 Influenza, High Dose Seasonal PF 04/28/2017  . Influenza-Unspecified 04/23/2015     Objective: Vital Signs: BP 117/62 (BP Location: Left Arm, Patient Position: Sitting, Cuff Size: Normal)   Pulse 64   Resp 12   Ht 5' 6.5" (1.689 m)   Wt 133 lb (60.3 kg)   LMP 07/14/1993   BMI 21.15 kg/m    Physical Exam Vitals signs and nursing note reviewed.  Constitutional:      Appearance: She is well-developed.  HENT:     Head: Normocephalic and atraumatic.  Eyes:     Conjunctiva/sclera: Conjunctivae normal.  Neck:     Musculoskeletal: Normal range of motion.  Cardiovascular:     Rate and Rhythm: Normal rate and regular rhythm.     Heart sounds: Normal heart sounds.  Pulmonary:     Effort: Pulmonary effort is normal.     Breath sounds: Normal breath sounds.  Abdominal:     General: Bowel sounds are normal.     Palpations: Abdomen is soft.  Lymphadenopathy:     Cervical: No cervical adenopathy.  Skin:    General: Skin is warm and dry.     Capillary Refill: Capillary refill takes less than 2 seconds.    Neurological:     Mental Status: She is alert and oriented to person, place, and time.  Psychiatric:        Behavior: Behavior normal.      Musculoskeletal Exam: C-spine limited lateral rotation.  Thoracic and lumbar spine good ROM.  No midline spinal tenderness.  No SI joint tenderness.  Shoulder joints, elbow joints, wrist joints, MCPs, PIPs ,and DIPs good ROM with no synovitis.  Complete fist formation bilaterally.  PIP and DIP synovial thickening.  Bilateral 1st, 2nd, and 3rd MCP joint synovial thickening but no synovitis.  Hip joints, knee joints, ankle joints, MTPs, PIPs, and DIPs good ROM with no synovitis.  No warmth or effusion of knee joints.  Tenderness over the distal IT band.  No warmth or effusion of ankle joints.   CDAI Exam: CDAI Score: Not documented Patient Global Assessment: Not documented; Provider Global Assessment: Not documented  Swollen: Not documented; Tender: Not documented Joint Exam   Not documented   There is currently no information documented on the homunculus. Go to the Rheumatology activity and complete the homunculus joint exam.  Investigation: No additional findings.  Imaging: No results found.  Recent Labs: Lab Results  Component Value Date   WBC 6.1 10/01/2018   HGB 12.5 10/01/2018   PLT 413 (H) 10/01/2018   NA 144 10/01/2018   K 4.3 10/01/2018   CL 105 10/01/2018   CO2 32 10/01/2018   GLUCOSE 86 10/01/2018   BUN 18 10/01/2018   CREATININE 0.83 10/01/2018   BILITOT 0.5 10/01/2018   ALKPHOS 123 01/08/2017   AST 21 10/01/2018   ALT 8 10/01/2018   PROT 6.5 10/01/2018   ALBUMIN 4.2 01/08/2017   CALCIUM 9.8 10/01/2018   GFRAA 83 10/01/2018    Speciality Comments: PLQ Eye exam: 02/17/18 WNL @ [facility] Follow up in 1 year last   Procedures:  No procedures performed Allergies: Levaquin [levofloxacin] and Cephalexin    Assessment / Plan:     Visit Diagnoses: Rheumatoid arthritis involving multiple joints (HCC) -  Positive RF, positive anti-CCP history of elevated ESR ANA negative: She has no synovitis on exam.  She has not had any recent rheumatoid arthritis flares.  She is clinically doing well on Arava 10 mg 1 tablet by mouth daily and Plaquenil 200 mg 1 tablet twice daily Monday through Friday.  Refills of a refill on Plaquenil were sent to the pharmacy today.  She has no joint pain or joint swelling at this time.  She is not been experiencing any morning stiffness.  She was given Voltaren gel which she can apply topically PRN for pain relief.  We discussed that anytime she has an infection or start on antibiotics/antivirals she is to hold her dose of Arava.  She was diagnosed with influenza 3 weeks ago and was treated with Xofluza.  She is asymptomatic at this time.  She denies any recent fevers.  She was advised to notify us if she develops increased joint pain or joint swelling.  She will follow-up in the office in 5 months.- Plan: hydroxychloroquine (PLAQUENIL) 200 MG tablet  High risk medication use - Arava 10 mg daily and Plaquenil 200 mg twice daily Monday through Friday only. Plaquenil eye exam normal on 02/17/2018 and will monitor yearly.  Most recent CBC/CMP within normal limits on 07/12/2018.  Due for CBC/CMP today and will monitor every 3 months.  Standing orders are in place.  She was diagnosed with the flu 3 weeks ago and was treated with Xofluza.   Primary osteoarthritis of both hands: She has PIP and DIP synovial thickening consistent with osteoarthritis of both hands.  She has no tenderness or synovitis.  She has complete fist formation bilaterally.  Joint protection and muscle strengthening were discussed.   Primary osteoarthritis of both knees - Bilateral moderate with moderate chondromalacia patella: No warmth or effusion.  She has good ROM with no discomfort.   Primary osteoarthritis of both feet: She has osteoarthritic changes in both feet.  She wears proper fitting shoes.  She has no  discomfort at this time.   It band syndrome, left: She has tenderness over the distal left IT band. She was given a prescription for voltaren gel which she can apply topically.  She was also given a handout of exercises to perform.  She was advised to notify us if her symptoms persist.    Osteopenia   of multiple sites: She takes a calcium supplement daily.   History of pleural effusion - Dr. Lake Bells  Paroxysmal atrial fibrillation [facility]): She has not had any recent bouts of Afib.   History of bronchiectasis - She is followed up by Dr. Lake Bells on a yearly basis.   Other medical conditions are listed as follows:   History of hyperlipidemia  History of fracture of right hip   Orders: No orders of the defined types were placed in this encounter.  Meds ordered this encounter  Medications  . hydroxychloroquine (PLAQUENIL) 200 MG tablet    Sig: TAKE 1 TABLET BY MOUTH TWICE DAILY MONDAY THROUGH FRIDAY    Dispense:  120 tablet    Refill:  1  . leflunomide (ARAVA) 10 MG tablet    Sig: Take 1 tablet (10 mg total) by mouth daily.    Dispense:  90 tablet    Refill:  0  . diclofenac sodium (VOLTAREN) 1 % GEL    Sig: Apply 2 grams to 4 grams to affected area up to 4 times daily PRN.    Dispense:  4 Tube    Refill:  2    Face-to-face time spent with patient was 63mnutes. Greater than 50% of time was spent in counseling and coordination of care.  Follow-Up Instructions: Return in about 5 months (around 03/06/2019) for Rheumatoid arthritis.   TOfilia Neas PA-C   I examined and evaluated the patient with THazel SamsPA.  Patient had no synovitis on examination.  She appears to be doing well on [place]  She has ongoing discomfort due to underlying osteoarthritis.  The plan of care was discussed as noted above.  SBo Merino MD       Note - This record has been created using DEditor, commissioning  Chart creation errors have been sought, but may not always  have been located. Such  creation errors do not reflect on  the standard of medical care.

## 2018-10-01 ENCOUNTER — Other Ambulatory Visit: Payer: Self-pay

## 2018-10-01 DIAGNOSIS — Z79899 Other long term (current) drug therapy: Secondary | ICD-10-CM

## 2018-10-02 LAB — CBC WITH DIFFERENTIAL/PLATELET
Absolute Monocytes: 488 cells/uL (ref 200–950)
Basophils Absolute: 67 cells/uL (ref 0–200)
Basophils Relative: 1.1 %
EOS PCT: 5.2 %
Eosinophils Absolute: 317 cells/uL (ref 15–500)
HCT: 37.6 % (ref 35.0–45.0)
Hemoglobin: 12.5 g/dL (ref 11.7–15.5)
LYMPHS ABS: 1238 {cells}/uL (ref 850–3900)
MCH: 31.5 pg (ref 27.0–33.0)
MCHC: 33.2 g/dL (ref 32.0–36.0)
MCV: 94.7 fL (ref 80.0–100.0)
MPV: 9.9 fL (ref 7.5–12.5)
Monocytes Relative: 8 %
NEUTROS PCT: 65.4 %
Neutro Abs: 3989 cells/uL (ref 1500–7800)
Platelets: 413 10*3/uL — ABNORMAL HIGH (ref 140–400)
RBC: 3.97 10*6/uL (ref 3.80–5.10)
RDW: 11.8 % (ref 11.0–15.0)
Total Lymphocyte: 20.3 %
WBC: 6.1 10*3/uL (ref 3.8–10.8)

## 2018-10-02 LAB — COMPLETE METABOLIC PANEL WITH GFR
AG RATIO: 1.8 (calc) (ref 1.0–2.5)
ALT: 8 U/L (ref 6–29)
AST: 21 U/L (ref 10–35)
Albumin: 4.2 g/dL (ref 3.6–5.1)
Alkaline phosphatase (APISO): 72 U/L (ref 37–153)
BILIRUBIN TOTAL: 0.5 mg/dL (ref 0.2–1.2)
BUN: 18 mg/dL (ref 7–25)
CHLORIDE: 105 mmol/L (ref 98–110)
CO2: 32 mmol/L (ref 20–32)
Calcium: 9.8 mg/dL (ref 8.6–10.4)
Creat: 0.83 mg/dL (ref 0.50–0.99)
GFR, Est African American: 83 mL/min/{1.73_m2} (ref >=60)
GFR, Est Non African American: 72 mL/min/{1.73_m2} (ref >=60)
Globulin: 2.3 g/dL (calc) (ref 1.9–3.7)
Glucose, Bld: 86 mg/dL (ref 65–99)
Potassium: 4.3 mmol/L (ref 3.5–5.3)
Sodium: 144 mmol/L (ref 135–146)
Total Protein: 6.5 g/dL (ref 6.1–8.1)

## 2018-10-04 ENCOUNTER — Other Ambulatory Visit: Payer: Self-pay

## 2018-10-04 ENCOUNTER — Encounter: Payer: Self-pay | Admitting: Rheumatology

## 2018-10-04 ENCOUNTER — Ambulatory Visit: Payer: PPO | Admitting: Rheumatology

## 2018-10-04 VITALS — BP 117/62 | HR 64 | Resp 12 | Ht 66.5 in | Wt 133.0 lb

## 2018-10-04 DIAGNOSIS — M19042 Primary osteoarthritis, left hand: Secondary | ICD-10-CM

## 2018-10-04 DIAGNOSIS — M170 Bilateral primary osteoarthritis of knee: Secondary | ICD-10-CM

## 2018-10-04 DIAGNOSIS — M19041 Primary osteoarthritis, right hand: Secondary | ICD-10-CM

## 2018-10-04 DIAGNOSIS — M19072 Primary osteoarthritis, left ankle and foot: Secondary | ICD-10-CM

## 2018-10-04 DIAGNOSIS — I480 Paroxysmal atrial fibrillation: Secondary | ICD-10-CM | POA: Diagnosis not present

## 2018-10-04 DIAGNOSIS — M8589 Other specified disorders of bone density and structure, multiple sites: Secondary | ICD-10-CM | POA: Diagnosis not present

## 2018-10-04 DIAGNOSIS — Z8781 Personal history of (healed) traumatic fracture: Secondary | ICD-10-CM

## 2018-10-04 DIAGNOSIS — Z8709 Personal history of other diseases of the respiratory system: Secondary | ICD-10-CM | POA: Diagnosis not present

## 2018-10-04 DIAGNOSIS — Z8639 Personal history of other endocrine, nutritional and metabolic disease: Secondary | ICD-10-CM | POA: Diagnosis not present

## 2018-10-04 DIAGNOSIS — M069 Rheumatoid arthritis, unspecified: Secondary | ICD-10-CM

## 2018-10-04 DIAGNOSIS — Z79899 Other long term (current) drug therapy: Secondary | ICD-10-CM | POA: Diagnosis not present

## 2018-10-04 DIAGNOSIS — M7632 Iliotibial band syndrome, left leg: Secondary | ICD-10-CM | POA: Diagnosis not present

## 2018-10-04 DIAGNOSIS — M19071 Primary osteoarthritis, right ankle and foot: Secondary | ICD-10-CM

## 2018-10-04 MED ORDER — HYDROXYCHLOROQUINE SULFATE 200 MG PO TABS: ORAL_TABLET | ORAL | 1 refills | Status: DC

## 2018-10-04 MED ORDER — LEFLUNOMIDE 10 MG PO TABS: 10.0000 mg | ORAL_TABLET | Freq: Every day | ORAL | 0 refills | Status: DC

## 2018-10-04 MED ORDER — DICLOFENAC SODIUM 1 % TD GEL: TRANSDERMAL | 2 refills | Status: DC

## 2018-10-04 NOTE — Patient Instructions (Signed)
Iliotibial Band Syndrome Rehab  Ask your health care provider which exercises are safe for you. Do exercises exactly as told by your health care provider and adjust them as directed. It is normal to feel mild stretching, pulling, tightness, or discomfort as you do these exercises, but you should stop right away if you feel sudden pain or your pain gets worse. Do not begin these exercises until told by your health care provider.  Stretching and range of motion exercises  These exercises warm up your muscles and joints and improve the movement and flexibility of your hip and pelvis.  Exercise A: Quadriceps, prone    1. Lie on your abdomen on a firm surface, such as a bed or padded floor.  2. Bend your left / right knee and hold your ankle. If you cannot reach your ankle or pant leg, loop a belt around your foot and grab the belt instead.  3. Gently pull your heel toward your buttocks. Your knee should not slide out to the side. You should feel a stretch in the front of your thigh and knee.  4. Hold this position for __________ seconds.  Repeat __________ times. Complete this stretch __________ times a day.  Exercise B: Iliotibial band    1. Lie on your side with your left / right leg in the top position.  2. Bend both of your knees and grab your left / right ankle. Stretch out your bottom arm to help you balance.  3. Slowly bring your top knee back so your thigh goes behind your trunk.  4. Slowly lower your top leg toward the floor until you feel a gentle stretch on the outside of your left / right hip and thigh. If you do not feel a stretch and your knee will not fall farther, place the heel of your other foot on top of your knee and pull your knee down toward the floor with your foot.  5. Hold this position for __________ seconds.  Repeat __________ times. Complete this stretch __________ times a day.  Strengthening exercises  These exercises build strength and endurance in your hip and pelvis. Endurance is the  ability to use your muscles for a long time, even after they get tired.  Exercise C: Straight leg raises (hip abductors)    1. Lie on your side with your left / right leg in the top position. Lie so your head, shoulder, knee, and hip line up. You may bend your bottom knee to help you balance.  2. Roll your hips slightly forward so your hips are stacked directly over each other and your left / right knee is facing forward.  3. Tense the muscles in your outer thigh and lift your top leg 4-6 inches (10-15 cm).  4. Hold this position for __________ seconds.  5. Slowly return to the starting position. Let your muscles relax completely before doing another repetition.  Repeat __________ times. Complete this exercise __________ times a day.  Exercise D: Straight leg raises (hip extensors)  1. Lie on your abdomen on your bed or a firm surface. You can put a pillow under your hips if that is more comfortable.  2. Bend your left / right knee so your foot is straight up in the air.  3. Squeeze your buttock muscles and lift your left / right thigh off the bed. Do not let your back arch.  4. Tense this muscle as hard as you can without increasing any knee pain.  5. Hold   this position for __________ seconds.  6. Slowly lower your leg to the starting position and allow it to relax completely.  Repeat __________ times. Complete this exercise __________ times a day.  Exercise E: Hip hike  1. Stand sideways on a bottom step. Stand on your left / right leg with your other foot unsupported next to the step. You can hold onto the railing or wall if needed for balance.  2. Keep your knees straight and your torso square. Then, lift your left / right hip up toward the ceiling.  3. Slowly let your left / right hip lower toward the floor, past the starting position. Your foot should get closer to the floor. Do not lean or bend your knees.  Repeat __________ times. Complete this exercise __________ times a day.  This information is not  intended to replace advice given to you by your health care provider. Make sure you discuss any questions you have with your health care provider.  Document Released: 06/30/2005 Document Revised: 03/04/2016 Document Reviewed: 06/01/2015  Elsevier Interactive Patient Education © 2019 [employer]

## 2018-10-06 ENCOUNTER — Ambulatory Visit: Payer: PPO | Admitting: Rheumatology

## 2018-11-02 ENCOUNTER — Ambulatory Visit: Payer: PPO | Admitting: Pulmonary Disease

## 2018-11-02 ENCOUNTER — Telehealth: Payer: Self-pay | Admitting: Rheumatology

## 2018-11-02 DIAGNOSIS — M069 Rheumatoid arthritis, unspecified: Secondary | ICD-10-CM

## 2018-11-02 NOTE — Telephone Encounter (Signed)
Advised patient of information below. Patient will call around to several pharmacies and then call the office tomorrow with where she would like her prescription to be sent.

## 2018-11-02 NOTE — Telephone Encounter (Signed)
Patient calling in reference to plaquenil. Patient states she will not be able to get next refill due to short supply. Patient states neighbor has some that she was not able to take, but it expired in August of 2019. Patient wanted to know if she could take that? Is there an alternative to Plaquenil? Please call to advise.

## 2018-11-02 NOTE — Telephone Encounter (Signed)
Please advise patient to avoid taking another individuals prescription of PLQ.  We can provide a written prescription of PLQ that she can try taking to a different pharmacy.  She can call ahead to other pharmacies to make sure they have PLQ prior to going directly to the pharmacy.  We can either mail the prescription or she can come by the office to pick up the written prescription.

## 2018-11-03 MED ORDER — HYDROXYCHLOROQUINE SULFATE 200 MG PO TABS: ORAL_TABLET | ORAL | 0 refills | Status: DC

## 2018-11-03 NOTE — Telephone Encounter (Signed)
Patient left a message stating [facility] on [address] has generic Plaquenil. Pharmacy phone [phone]

## 2018-11-03 NOTE — Telephone Encounter (Signed)
Last Visit: 10/04/2018 Next Visit: 03/07/2019 Labs: 10/01/2018 platelets 413 Eye exam:  02/17/2018  Okay to refill per Dr. Estanislado Pandy.   Patient has been notified that prescription has been sent.

## 2018-12-20 DIAGNOSIS — L821 Other seborrheic keratosis: Secondary | ICD-10-CM | POA: Diagnosis not present

## 2018-12-20 DIAGNOSIS — D225 Melanocytic nevi of trunk: Secondary | ICD-10-CM | POA: Diagnosis not present

## 2018-12-20 DIAGNOSIS — L820 Inflamed seborrheic keratosis: Secondary | ICD-10-CM | POA: Diagnosis not present

## 2018-12-20 DIAGNOSIS — L814 Other melanin hyperpigmentation: Secondary | ICD-10-CM | POA: Diagnosis not present

## 2018-12-20 DIAGNOSIS — L237 Allergic contact dermatitis due to plants, except food: Secondary | ICD-10-CM | POA: Diagnosis not present

## 2018-12-20 DIAGNOSIS — Z85820 Personal history of malignant melanoma of skin: Secondary | ICD-10-CM | POA: Diagnosis not present

## 2018-12-20 DIAGNOSIS — T148XXA Other injury of unspecified body region, initial encounter: Secondary | ICD-10-CM | POA: Diagnosis not present

## 2018-12-20 DIAGNOSIS — Z85828 Personal history of other malignant neoplasm of skin: Secondary | ICD-10-CM | POA: Diagnosis not present

## 2019-01-04 ENCOUNTER — Telehealth: Payer: Self-pay

## 2019-01-04 NOTE — Telephone Encounter (Signed)
Attempted to contact the patient and left message for patient to call the office.  

## 2019-01-04 NOTE — Telephone Encounter (Signed)
We can order the labs.  For the COVID-19 antibody testing he will have to get from her PCP.

## 2019-01-04 NOTE — Telephone Encounter (Signed)
Patient left message requesting lab orders and COVID19 antibody testing orders be sent to [facility] on [address]. Please advise.

## 2019-01-05 ENCOUNTER — Other Ambulatory Visit: Payer: Self-pay | Admitting: *Deleted

## 2019-01-05 ENCOUNTER — Telehealth: Payer: Self-pay | Admitting: Rheumatology

## 2019-01-05 DIAGNOSIS — Z79899 Other long term (current) drug therapy: Secondary | ICD-10-CM

## 2019-01-05 NOTE — Telephone Encounter (Signed)
Patient left a voicemail stating she was returning a call to the office.  Patient states you can leave a message on her answering machine.

## 2019-01-05 NOTE — Telephone Encounter (Signed)
Patient advised per Dr. Estanislado Pandy we can order the standing labs.  For the COVID-19 antibody testing he will have to get from her PCP. Patient verbalized understanding. Lab Orders released.

## 2019-01-07 ENCOUNTER — Other Ambulatory Visit: Payer: Self-pay | Admitting: Physician Assistant

## 2019-01-10 ENCOUNTER — Other Ambulatory Visit: Payer: Self-pay

## 2019-01-10 DIAGNOSIS — Z79899 Other long term (current) drug therapy: Secondary | ICD-10-CM

## 2019-01-10 NOTE — Telephone Encounter (Addendum)
Last Visit: 10/04/2018 Next Visit: 03/07/2019 Labs: 10/01/2018 platelets 413  Patient update labs today  Okay to refill per Dr. Estanislado Pandy

## 2019-01-11 LAB — CBC WITH DIFFERENTIAL/PLATELET
Absolute Monocytes: 548 cells/uL (ref 200–950)
Basophils Absolute: 69 cells/uL (ref 0–200)
Basophils Relative: 1.1 %
Eosinophils Absolute: 309 cells/uL (ref 15–500)
Eosinophils Relative: 4.9 %
HCT: 38.8 % (ref 35.0–45.0)
Hemoglobin: 12.9 g/dL (ref 11.7–15.5)
Lymphs Abs: 1846 cells/uL (ref 850–3900)
MCH: 31.9 pg (ref 27.0–33.0)
MCHC: 33.2 g/dL (ref 32.0–36.0)
MCV: 95.8 fL (ref 80.0–100.0)
MPV: 10.2 fL (ref 7.5–12.5)
Monocytes Relative: 8.7 %
Neutro Abs: 3528 cells/uL (ref 1500–7800)
Neutrophils Relative %: 56 %
Platelets: 276 10*3/uL (ref 140–400)
RBC: 4.05 10*6/uL (ref 3.80–5.10)
RDW: 11.8 % (ref 11.0–15.0)
Total Lymphocyte: 29.3 %
WBC: 6.3 10*3/uL (ref 3.8–10.8)

## 2019-01-11 LAB — COMPLETE METABOLIC PANEL WITH GFR
AG Ratio: 1.9 (calc) (ref 1.0–2.5)
ALT: 8 U/L (ref 6–29)
AST: 21 U/L (ref 10–35)
Albumin: 4.4 g/dL (ref 3.6–5.1)
Alkaline phosphatase (APISO): 71 U/L (ref 37–153)
BUN: 15 mg/dL (ref 7–25)
CO2: 29 mmol/L (ref 20–32)
Calcium: 10.1 mg/dL (ref 8.6–10.4)
Chloride: 104 mmol/L (ref 98–110)
Creat: 0.83 mg/dL (ref 0.50–0.99)
GFR, Est African American: 83 mL/min/{1.73_m2} (ref >=60)
GFR, Est Non African American: 72 mL/min/{1.73_m2} (ref >=60)
Globulin: 2.3 g/dL (calc) (ref 1.9–3.7)
Glucose, Bld: 84 mg/dL (ref 65–99)
Potassium: 4.4 mmol/L (ref 3.5–5.3)
Sodium: 141 mmol/L (ref 135–146)
Total Bilirubin: 0.4 mg/dL (ref 0.2–1.2)
Total Protein: 6.7 g/dL (ref 6.1–8.1)

## 2019-01-28 ENCOUNTER — Telehealth: Payer: Self-pay | Admitting: Rheumatology

## 2019-01-28 NOTE — Telephone Encounter (Signed)
Advised patient I would speak with Dr. Estanislado Pandy on Monday 01/31/2019 and then get back to her. Patient verbalized understanding.

## 2019-01-28 NOTE — Telephone Encounter (Signed)
Patient left a voicemail stating her PCP Dr. Nyoka Cowden would like to prescribe Fosamax.  Patient states Dr. Nyoka Cowden wants to clear it with Dr. Estanislado Pandy and make sure it won't interfere with her treatment plan.  Please advise.

## 2019-01-31 NOTE — Telephone Encounter (Signed)
Attempted to contact patient and left message on machine to advise patient to call the office.  

## 2019-01-31 NOTE — Telephone Encounter (Signed)
Advised patient that Her treatment will not interfere with the Fosamax. Patient verbalized understanding.

## 2019-01-31 NOTE — Telephone Encounter (Signed)
Please advise on message for Dr. Nyoka Cowden to start patient on fosamax.

## 2019-01-31 NOTE — Telephone Encounter (Signed)
Patient is started on Fosamax.  Her treatment will not interfere with the Fosamax.

## 2019-02-22 NOTE — Progress Notes (Signed)
Office Visit Note  Patient: [name] [name]             Date of Birth: December 20, 1950           MRN: [id number]             PCP: [name] Erp, MD Referring: [name] Erp, MD Visit Date: 03/07/2019 Occupation: '@GUAROCC' @  Subjective:  Medication management   History of Present Illness: [name] [name] is a 68 y.o. female with history of seropositive rheumatoid arthritis.  She states she has been doing well on combination of leflunomide and Plaquenil.  She denies any joint pain or joint swelling.  She states she has intermittent left trochanteric bursitis but which is not painful currently.  She was recently seen by her GYN and she is in the osteoporosis range again.  She states that Fosamax was added back to her regimen.  Activities of Daily Living:  Patient reports morning stiffness for 0 minutes.   Patient Denies nocturnal pain.  Difficulty dressing/grooming: Denies Difficulty climbing stairs: Denies Difficulty getting out of chair: Denies Difficulty using hands for taps, buttons, cutlery, and/or writing: Denies  Review of Systems  Constitutional: Positive for fatigue.  HENT: Negative for mouth sores, mouth dryness and nose dryness.   Eyes: Negative for itching and dryness.  Respiratory: Negative for shortness of breath, wheezing and difficulty breathing.   Cardiovascular: Negative for chest pain and palpitations.  Gastrointestinal: Negative for abdominal pain, blood in stool, constipation and diarrhea.  Endocrine: Negative for increased urination.  Genitourinary: Negative for difficulty urinating and painful urination.  Musculoskeletal: Negative for arthralgias, joint pain, joint swelling and morning stiffness.  Skin: Negative for rash, hair loss and redness.  Allergic/Immunologic: Negative for susceptible to infections.  Neurological: Negative for dizziness, headaches, memory loss and weakness.  Hematological: Negative for swollen glands.  Psychiatric/Behavioral: Negative for confusion  and sleep disturbance. The patient is not nervous/anxious.     PMFS History:  Patient Active Problem List   Diagnosis Date Noted  . Primary osteoarthritis of both knees 01/08/2017  . Primary osteoarthritis of both feet 01/08/2017  . History of pleural effusion 12/01/2016  . History of recurrent pneumonia 12/01/2016  . History of hyperlipidemia 12/01/2016  . High risk medication use 12/01/2016  . Trigger thumb, left thumb 12/01/2016  . Elevated sed rate 12/01/2016  . History of fracture of right hip 12/01/2016  . Primary osteoarthritis of both hands 12/01/2016  . Rheumatoid arthritis involving multiple joints ([place]) 04/09/2015  . HCAP (healthcare-associated pneumonia)   . Paroxysmal atrial fibrillation (HCC)   . Tendonitis 04/03/2015  . Pleural effusion associated with pulmonary infection 03/27/2015  . Orthostatic syncope   . Syncope 03/18/2015  . Osteoporosis   . Hyperlipidemia 11/14/2013    Past Medical History:  Diagnosis Date  . Elevated cholesterol   . Migraines    occular  . Osteoporosis 2015   based on hip fracture from fall  DEXA 01/2018 T score -2.3  . Pleural effusion 9/206   Bilateral effusions, s/p thora on R c/w exudate  . Pneumonia 03/2015   R  . RA (rheumatoid arthritis) ([place])   . Skin cancer (melanoma) (HCC)    Right Arm, nose.     Family History  Problem Relation Age of Onset  . Heart attack Father 39  . Heart disease Father   . Osteoporosis Sister   . High Cholesterol Sister   . Melanoma Daughter   . Melanoma Daughter    Past Surgical History:  Procedure Laterality  Date  . COLONOSCOPY WITH PROPOFOL N/A 11/04/2016   Procedure: COLONOSCOPY WITH PROPOFOL;  Surgeon: Garlan Fair, MD;  Location: [facility];  Service: Endoscopy;  Laterality: N/A;  . FINGER SURGERY    . HIP ARTHROPLASTY Right 11/14/2013   Procedure: RIGHT CANNULATED HIP PINNING;  Surgeon: Alta Corning, MD;  Location: [facility];  Service: Orthopedics;  Laterality: Right;  BIOMET  .  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 Influenza, High Dose Seasonal PF 04/28/2017  . Influenza-Unspecified 04/23/2015     Objective: Vital Signs: BP (!) 103/56 (BP Location: Left Arm, Patient Position: Sitting, Cuff Size: Normal)   Pulse 79   Resp 12   Ht 5' 6.5" (1.689 m)   Wt 132 lb (59.9 kg)   LMP 07/14/1993   BMI 20.99 kg/m    Physical Exam Vitals signs and nursing note reviewed.  Constitutional:      Appearance: She is well-developed.  HENT:     Head: Normocephalic and atraumatic.  Eyes:     Conjunctiva/sclera: Conjunctivae normal.  Neck:     Musculoskeletal: Normal range of motion.  Cardiovascular:     Rate and Rhythm: Normal rate and regular rhythm.     Heart sounds: Normal heart sounds.  Pulmonary:     Effort: Pulmonary effort is normal.     Breath sounds: Normal breath sounds.  Abdominal:     General: Bowel sounds are normal.     Palpations: Abdomen is soft.  Lymphadenopathy:     Cervical: No cervical adenopathy.  Skin:    General: Skin is warm and dry.     Capillary Refill: Capillary refill takes less than 2 seconds.  Neurological:     Mental Status: She is alert and oriented to person, place, and time.  Psychiatric:        Behavior: Behavior normal.      Musculoskeletal Exam: C-spine was good range of motion.  She has some trapezius tenderness.  Shoulder joints, elbow joints, wrist joints with good range of motion.  She has MCP joint thickening.  She has PIP and DIP thickening with no synovitis.  Hip joints, knee joints, ankles MTPs PIPs with good range of  motion with no synovitis.  CDAI Exam: CDAI Score: 0  Patient Global: 0 mm; Provider Global: 0 mm Swollen: 0 ; Tender: 0  Joint Exam   No joint exam has been documented for this visit   There is currently no information documented on the homunculus. Go to the Rheumatology activity and complete the homunculus joint exam.  Investigation: No additional findings.  Imaging: No results found.  Recent Labs: Lab Results  Component Value Date   WBC 6.3 01/10/2019   HGB 12.9 01/10/2019   PLT 276 01/10/2019   NA 141 01/10/2019   K 4.4 01/10/2019   CL 104 01/10/2019   CO2 29 01/10/2019   GLUCOSE 84 01/10/2019   BUN 15 01/10/2019   CREATININE 0.83 01/10/2019   BILITOT 0.4 01/10/2019   ALKPHOS 123 01/08/2017   AST 21 01/10/2019   ALT 8 01/10/2019   PROT 6.7 01/10/2019  ALBUMIN 4.2 01/08/2017   CALCIUM 10.1 01/10/2019   GFRAA 83 01/10/2019    Speciality Comments: PLQ Eye exam: 02/17/18 WNL @ [facility] Follow up in 1 year last   Procedures:  No procedures performed Allergies: Levaquin [levofloxacin] and Cephalexin   Assessment / Plan:     Visit Diagnoses: Rheumatoid arthritis involving multiple joints (HCC) - Positive RF, positive anti-CCP history of elevated ESR ANA negative.  She is clinically doing well with no synovitis on examination today.  High risk medication use - Arava 10 mg daily and Plaquenil 200 mg twice daily Monday through Friday only.Eye exam: 02/17/18.  Her labs were normal and will be due again in September.  Primary osteoarthritis of both hands-joint protection muscle strengthening was discussed.  Primary osteoarthritis of both knees - Bilateral moderate with moderate chondromalacia patella.  Currently she is not having much discomfort.  Primary osteoarthritis of both feet-she has been using proper fitting shoes which are helpful.  Osteopenia of multiple sites - h/o Osteoporosis, restarted Fosamax after the last DXA per patient.  Other  medical problems are listed as follows:  History of fracture of right hip  Paroxysmal atrial fibrillation (HCC)  History of pleural effusion - Dr. Lake Bells  History of hyperlipidemia  History of bronchiectasis - She is followed up by Dr. Lake Bells on a yearly basis.  Orders: No orders of the defined types were placed in this encounter.  No orders of the defined types were placed in this encounter.    Follow-Up Instructions: Return in 5 months (on 08/07/2019) for Rheumatoid arthritis, Osteoarthritis.   Bo Merino, MD  Note - This record has been created using Editor, commissioning.  Chart creation errors have been sought, but may not always  have been located. Such creation errors do not reflect on  the standard of medical care.

## 2019-02-23 ENCOUNTER — Other Ambulatory Visit: Payer: Self-pay | Admitting: Rheumatology

## 2019-02-23 DIAGNOSIS — M069 Rheumatoid arthritis, unspecified: Secondary | ICD-10-CM

## 2019-02-23 MED ORDER — HYDROXYCHLOROQUINE SULFATE 200 MG PO TABS: ORAL_TABLET | ORAL | 0 refills | Status: DC

## 2019-02-23 NOTE — Telephone Encounter (Signed)
Patient left a voicemail requesting prescription refill of Plaquenil.  Patient is requesting the prescription be sent to [facility] at [place].

## 2019-02-23 NOTE — Telephone Encounter (Signed)
Last Visit:10/04/2018 Next Visit:03/07/2019 Labs: 01/10/19 WNL PLQ Eye exam: 02/17/18 WNL  Patient reminded she is due for to update her PLQ eye exam and states she will have that scheduled as soon as possible.   Okay to refill per Dr. Estanislado Pandy.

## 2019-03-07 ENCOUNTER — Encounter: Payer: Self-pay | Admitting: Rheumatology

## 2019-03-07 ENCOUNTER — Other Ambulatory Visit: Payer: Self-pay

## 2019-03-07 ENCOUNTER — Ambulatory Visit (INDEPENDENT_AMBULATORY_CARE_PROVIDER_SITE_OTHER): Payer: PPO | Admitting: Rheumatology

## 2019-03-07 VITALS — BP 103/56 | HR 79 | Resp 12 | Ht 66.5 in | Wt 132.0 lb

## 2019-03-07 DIAGNOSIS — M8589 Other specified disorders of bone density and structure, multiple sites: Secondary | ICD-10-CM

## 2019-03-07 DIAGNOSIS — M19041 Primary osteoarthritis, right hand: Secondary | ICD-10-CM

## 2019-03-07 DIAGNOSIS — M19042 Primary osteoarthritis, left hand: Secondary | ICD-10-CM

## 2019-03-07 DIAGNOSIS — M19072 Primary osteoarthritis, left ankle and foot: Secondary | ICD-10-CM

## 2019-03-07 DIAGNOSIS — I480 Paroxysmal atrial fibrillation: Secondary | ICD-10-CM

## 2019-03-07 DIAGNOSIS — M19071 Primary osteoarthritis, right ankle and foot: Secondary | ICD-10-CM

## 2019-03-07 DIAGNOSIS — Z8639 Personal history of other endocrine, nutritional and metabolic disease: Secondary | ICD-10-CM | POA: Diagnosis not present

## 2019-03-07 DIAGNOSIS — Z79899 Other long term (current) drug therapy: Secondary | ICD-10-CM

## 2019-03-07 DIAGNOSIS — Z8781 Personal history of (healed) traumatic fracture: Secondary | ICD-10-CM | POA: Diagnosis not present

## 2019-03-07 DIAGNOSIS — Z8709 Personal history of other diseases of the respiratory system: Secondary | ICD-10-CM

## 2019-03-07 DIAGNOSIS — M170 Bilateral primary osteoarthritis of knee: Secondary | ICD-10-CM | POA: Diagnosis not present

## 2019-03-07 DIAGNOSIS — M069 Rheumatoid arthritis, unspecified: Secondary | ICD-10-CM

## 2019-03-07 NOTE — Patient Instructions (Signed)
Standing Labs We placed an order today for your standing lab work.    Please come back and get your standing labs in September and every 3 months  We have open lab daily Monday through Thursday from 8:30-12:30 PM and 1:30-4:30 PM and Friday from 8:30-12:30 PM and 1:30 -4:00 PM at the office of Dr. Terica Yogi.   You may experience shorter wait times on Monday and Friday afternoons. The office is located at [address], [address], [place], [place] [address] No appointment is necessary.   Labs are drawn by [facility].  You may receive a bill from [facility] for your lab work.  If you wish to have your labs drawn at another location, please call the office 24 hours in advance to send orders.  If you have any questions regarding directions or hours of operation,  please call [phone].   Just as a reminder please drink plenty of water prior to coming for your lab work. Thanks!  

## 2019-04-12 DIAGNOSIS — H903 Sensorineural hearing loss, bilateral: Secondary | ICD-10-CM | POA: Diagnosis not present

## 2019-04-15 ENCOUNTER — Other Ambulatory Visit: Payer: Self-pay

## 2019-04-15 ENCOUNTER — Telehealth: Payer: Self-pay | Admitting: Rheumatology

## 2019-04-15 DIAGNOSIS — Z79899 Other long term (current) drug therapy: Secondary | ICD-10-CM | POA: Diagnosis not present

## 2019-04-15 MED ORDER — LEFLUNOMIDE 10 MG PO TABS: ORAL_TABLET | ORAL | 0 refills | Status: DC

## 2019-04-15 NOTE — Telephone Encounter (Signed)
Last Visit: 03/07/19 Next Visit: 08/10/19 Labs: 01/10/19 WNL  Left message to remind she is due for labs.   Okay to refill per Dr. Estanislado Pandy

## 2019-04-15 NOTE — Telephone Encounter (Signed)
Patient left a voicemail requesting prescription refill of Arava to be sent to [facility] at [place].  Patient states she will be out of medication this week.

## 2019-04-16 LAB — COMPLETE METABOLIC PANEL WITH GFR
AG Ratio: 1.9 (calc) (ref 1.0–2.5)
ALT: 9 U/L (ref 6–29)
AST: 21 U/L (ref 10–35)
Albumin: 4.3 g/dL (ref 3.6–5.1)
Alkaline phosphatase (APISO): 68 U/L (ref 37–153)
BUN: 20 mg/dL (ref 7–25)
CO2: 35 mmol/L — ABNORMAL HIGH (ref 20–32)
Calcium: 10.4 mg/dL (ref 8.6–10.4)
Chloride: 104 mmol/L (ref 98–110)
Creat: 0.85 mg/dL (ref 0.50–0.99)
GFR, Est African American: 81 mL/min/{1.73_m2} (ref >=60)
GFR, Est Non African American: 70 mL/min/{1.73_m2} (ref >=60)
Globulin: 2.3 g/dL (calc) (ref 1.9–3.7)
Glucose, Bld: 82 mg/dL (ref 65–99)
Potassium: 4.2 mmol/L (ref 3.5–5.3)
Sodium: 142 mmol/L (ref 135–146)
Total Bilirubin: 0.6 mg/dL (ref 0.2–1.2)
Total Protein: 6.6 g/dL (ref 6.1–8.1)

## 2019-04-16 LAB — CBC WITH DIFFERENTIAL/PLATELET
Absolute Monocytes: 458 cells/uL (ref 200–950)
Basophils Absolute: 58 cells/uL (ref 0–200)
Basophils Relative: 1 %
Eosinophils Absolute: 191 cells/uL (ref 15–500)
Eosinophils Relative: 3.3 %
HCT: 40.2 % (ref 35.0–45.0)
Hemoglobin: 13.1 g/dL (ref 11.7–15.5)
Lymphs Abs: 1125 cells/uL (ref 850–3900)
MCH: 31.1 pg (ref 27.0–33.0)
MCHC: 32.6 g/dL (ref 32.0–36.0)
MCV: 95.5 fL (ref 80.0–100.0)
MPV: 10.6 fL (ref 7.5–12.5)
Monocytes Relative: 7.9 %
Neutro Abs: 3967 cells/uL (ref 1500–7800)
Neutrophils Relative %: 68.4 %
Platelets: 297 10*3/uL (ref 140–400)
RBC: 4.21 10*6/uL (ref 3.80–5.10)
RDW: 11.8 % (ref 11.0–15.0)
Total Lymphocyte: 19.4 %
WBC: 5.8 10*3/uL (ref 3.8–10.8)

## 2019-04-20 ENCOUNTER — Encounter: Payer: Self-pay | Admitting: Gynecology

## 2019-05-18 ENCOUNTER — Other Ambulatory Visit: Payer: Self-pay | Admitting: Rheumatology

## 2019-05-19 NOTE — Telephone Encounter (Signed)
Last Visit: 03/07/19 Next Visit: 08/10/19 Labs: 04/15/19 CO2 borderline elevated. Rest of CMP WNL. CBC WNL.  Okay to refill per Dr. Estanislado Pandy

## 2019-05-23 ENCOUNTER — Telehealth: Payer: Self-pay | Admitting: Rheumatology

## 2019-05-23 DIAGNOSIS — M069 Rheumatoid arthritis, unspecified: Secondary | ICD-10-CM

## 2019-05-23 MED ORDER — HYDROXYCHLOROQUINE SULFATE 200 MG PO TABS: ORAL_TABLET | ORAL | 0 refills | Status: DC

## 2019-05-23 NOTE — Telephone Encounter (Signed)
Last Visit: 03/07/19 Next Visit: 08/10/19 Labs: 04/15/19 CO2 borderline elevated. Rest of CMP WNL. CBC WNL. PLQ Eye exam: 04/12/19 WNL   Okay to refill per Dr. Estanislado Pandy

## 2019-05-23 NOTE — Telephone Encounter (Signed)
Patient called stating she is in [facility] and is requesting prescription refill of Hydroxychloroquine (3 month supply) to be sent to [facility] at the corner of [address].  Patient states their phone [phone]

## 2019-07-06 ENCOUNTER — Telehealth: Payer: Self-pay | Admitting: Rheumatology

## 2019-07-06 NOTE — Telephone Encounter (Signed)
Patient called stating her employer [employer] is offering the COVID vaccine and is requesting a return call to let her know if it is okay for her to get it.

## 2019-07-06 NOTE — Telephone Encounter (Signed)
The covid-19 vaccine is not a live vaccination.  It is a mRNA vaccine.  She is safe to get the vaccination.

## 2019-07-06 NOTE — Telephone Encounter (Signed)
Advised patient The covid-19 vaccine is not a live vaccination.  It is a mRNA vaccine.  She is safe to get the vaccination. Patient verbalized understanding.

## 2019-07-15 HISTORY — PX: SQUAMOUS CELL CARCINOMA EXCISION: SHX2433

## 2019-07-22 ENCOUNTER — Telehealth: Payer: Self-pay | Admitting: Rheumatology

## 2019-07-22 DIAGNOSIS — M069 Rheumatoid arthritis, unspecified: Secondary | ICD-10-CM

## 2019-07-22 MED ORDER — HYDROXYCHLOROQUINE SULFATE 200 MG PO TABS: ORAL_TABLET | ORAL | 0 refills | Status: DC

## 2019-07-22 NOTE — Telephone Encounter (Signed)
Last Visit: 03/07/2019 Next Visit: 08/10/2019 Labs: 04/15/2019 CO2 borderline elevated. Rest of CMP WNL. CBC WNL. Eye exam: 04/12/2019   Attempted to contact patient and left message on machine to advise patient plaquenil refill has been sent and labs can be postponed for a couple of weeks but not longer than that as she was due to update labs on 07/16/2019.  Okay to refill per Dr. Estanislado Pandy.

## 2019-07-22 NOTE — Telephone Encounter (Signed)
Patient called requesting prescription refill of Plaquenil to be sent to [facility] at [place].  Patient states she will be out of medication on Monday 07/25/19.  Patient states she wants to wait to have her labwork because she has been taking extra strength Tylenol for her back pain and isn't sure if that will distort the numbers.

## 2019-07-28 ENCOUNTER — Telehealth: Payer: Self-pay | Admitting: Rheumatology

## 2019-07-28 NOTE — Telephone Encounter (Signed)
Patient will call back with appointment date & time to send labwork.

## 2019-08-02 ENCOUNTER — Telehealth: Payer: Self-pay | Admitting: Rheumatology

## 2019-08-02 DIAGNOSIS — Z79899 Other long term (current) drug therapy: Secondary | ICD-10-CM

## 2019-08-02 NOTE — Telephone Encounter (Signed)
Lab order released. 

## 2019-08-02 NOTE — Telephone Encounter (Signed)
Patient going to [facility] on Thursday for lab draw. Please release orders.

## 2019-08-04 DIAGNOSIS — Z79899 Other long term (current) drug therapy: Secondary | ICD-10-CM | POA: Diagnosis not present

## 2019-08-05 LAB — CBC WITH DIFFERENTIAL/PLATELET
Basophils Absolute: 0.1 10*3/uL (ref 0.0–0.2)
Basos: 1 %
EOS (ABSOLUTE): 0.3 10*3/uL (ref 0.0–0.4)
Eos: 5 %
Hematocrit: 39.4 % (ref 34.0–46.6)
Hemoglobin: 13.1 g/dL (ref 11.1–15.9)
Immature Grans (Abs): 0 10*3/uL (ref 0.0–0.1)
Immature Granulocytes: 0 %
Lymphocytes Absolute: 1.5 10*3/uL (ref 0.7–3.1)
Lymphs: 27 %
MCH: 31.9 pg (ref 26.6–33.0)
MCHC: 33.2 g/dL (ref 31.5–35.7)
MCV: 96 fL (ref 79–97)
Monocytes Absolute: 0.5 10*3/uL (ref 0.1–0.9)
Monocytes: 10 %
Neutrophils Absolute: 3 10*3/uL (ref 1.4–7.0)
Neutrophils: 57 %
Platelets: 283 10*3/uL (ref 150–450)
RBC: 4.11 x10E6/uL (ref 3.77–5.28)
RDW: 11.5 % — ABNORMAL LOW (ref 11.7–15.4)
WBC: 5.3 10*3/uL (ref 3.4–10.8)

## 2019-08-05 LAB — CMP14+EGFR
ALT: 11 IU/L (ref 0–32)
AST: 21 IU/L (ref 0–40)
Albumin/Globulin Ratio: 2.5 — ABNORMAL HIGH (ref 1.2–2.2)
Albumin: 4.5 g/dL (ref 3.8–4.8)
Alkaline Phosphatase: 77 IU/L (ref 39–117)
BUN/Creatinine Ratio: 17 (ref 12–28)
BUN: 13 mg/dL (ref 8–27)
Bilirubin Total: 0.3 mg/dL (ref 0.0–1.2)
CO2: 28 mmol/L (ref 20–29)
Calcium: 9.5 mg/dL (ref 8.7–10.3)
Chloride: 103 mmol/L (ref 96–106)
Creatinine, Ser: 0.76 mg/dL (ref 0.57–1.00)
GFR calc Af Amer: 92 mL/min/{1.73_m2} (ref >59)
GFR calc non Af Amer: 80 mL/min/{1.73_m2} (ref >59)
Globulin, Total: 1.8 g/dL (ref 1.5–4.5)
Glucose: 92 mg/dL (ref 65–99)
Potassium: 4.1 mmol/L (ref 3.5–5.2)
Sodium: 143 mmol/L (ref 134–144)
Total Protein: 6.3 g/dL (ref 6.0–8.5)

## 2019-08-05 NOTE — Telephone Encounter (Signed)
CBC and CMP are stable.

## 2019-08-10 ENCOUNTER — Ambulatory Visit: Payer: PPO | Admitting: Rheumatology

## 2019-08-17 DIAGNOSIS — M069 Rheumatoid arthritis, unspecified: Secondary | ICD-10-CM | POA: Diagnosis not present

## 2019-08-17 DIAGNOSIS — Z1231 Encounter for screening mammogram for malignant neoplasm of breast: Secondary | ICD-10-CM | POA: Diagnosis not present

## 2019-08-17 DIAGNOSIS — M810 Age-related osteoporosis without current pathological fracture: Secondary | ICD-10-CM | POA: Diagnosis not present

## 2019-08-17 DIAGNOSIS — Z1331 Encounter for screening for depression: Secondary | ICD-10-CM | POA: Diagnosis not present

## 2019-08-27 ENCOUNTER — Other Ambulatory Visit: Payer: Self-pay | Admitting: Rheumatology

## 2019-08-29 NOTE — Telephone Encounter (Signed)
Last Visit: 03/07/2019 Next Visit: 09/23/2019 Labs: 08/04/2019 CBC and CMP are stable.  Okay to refill per Dr. Estanislado Pandy.

## 2019-09-09 ENCOUNTER — Ambulatory Visit: Payer: PPO

## 2019-09-16 NOTE — Progress Notes (Signed)
Office Visit Note  Patient: [name] [name]             Date of Birth: 02-Jun-1951           MRN: [id number]             PCP: Levin Erp, MD Referring: Levin Erp, MD Visit Date: 09/23/2019 Occupation: '@GUAROCC' @  Subjective:  Other (recent flare in foot, lasted approximately 3 hours )   History of Present Illness: [name] [name] is a 69 y.o. female with history of rheumatoid arthritis and osteoarthritis.  She states few weeks ago while she was gardening she started having pain and swelling in her left foot.  She states the pain was so bad that she had to sit in the bathtub and she use crutches but after 3 hours the pain completely resolved.  She does not recall having any injury.  She does not recall having an insect bite.  She believes it was a rheumatoid arthritis flare.  Although she has not had a rheumatoid arthritis flare in a long time.  Now none of the joints are painful.  She has minimal stiffness occasionally from osteoarthritis.  Activities of Daily Living:  Patient reports morning stiffness for 0 minutes.   Patient Denies nocturnal pain.  Difficulty dressing/grooming: Denies Difficulty climbing stairs: Denies Difficulty getting out of chair: Denies Difficulty using hands for taps, buttons, cutlery, and/or writing: Denies  Review of Systems  Constitutional: Negative for fatigue, night sweats, weight gain and weight loss.  HENT: Negative for mouth sores, trouble swallowing, trouble swallowing, mouth dryness and nose dryness.   Eyes: Negative for pain, redness, itching, visual disturbance and dryness.  Respiratory: Negative for cough, shortness of breath and difficulty breathing.   Cardiovascular: Negative for chest pain, palpitations, hypertension, irregular heartbeat and swelling in legs/feet.  Gastrointestinal: Negative for blood in stool, constipation and diarrhea.  Endocrine: Negative for increased urination.  Genitourinary: Negative for difficulty urinating, painful  urination and vaginal dryness.  Musculoskeletal: Negative for arthralgias, joint pain, joint swelling, myalgias, muscle weakness, morning stiffness, muscle tenderness and myalgias.  Skin: Negative for color change, rash, hair loss, skin tightness, ulcers and sensitivity to sunlight.  Allergic/Immunologic: Negative for susceptible to infections.  Neurological: Negative for dizziness, headaches, memory loss, night sweats and weakness.  Hematological: Positive for bruising/bleeding tendency. Negative for swollen glands.  Psychiatric/Behavioral: Negative for depressed mood, confusion and sleep disturbance. The patient is not nervous/anxious.     PMFS History:  Patient Active Problem List   Diagnosis Date Noted  . Primary osteoarthritis of both knees 01/08/2017  . Primary osteoarthritis of both feet 01/08/2017  . History of pleural effusion 12/01/2016  . History of recurrent pneumonia 12/01/2016  . History of hyperlipidemia 12/01/2016  . High risk medication use 12/01/2016  . Trigger thumb, left thumb 12/01/2016  . Elevated sed rate 12/01/2016  . History of fracture of right hip 12/01/2016  . Primary osteoarthritis of both hands 12/01/2016  . Rheumatoid arthritis involving multiple joints ([place]) 04/09/2015  . HCAP (healthcare-associated pneumonia)   . Paroxysmal atrial fibrillation (HCC)   . Tendonitis 04/03/2015  . Pleural effusion associated with pulmonary infection 03/27/2015  . Orthostatic syncope   . Syncope 03/18/2015  . Osteoporosis   . Hyperlipidemia 11/14/2013    Past Medical History:  Diagnosis Date  . Elevated cholesterol   . Migraines    occular  . Osteoporosis 2015   based on hip fracture from fall  DEXA 01/2018 T score -2.3  . Pleural effusion  9/206   Bilateral effusions, s/p thora on R c/w exudate  . Pneumonia 03/2015   R  . RA (rheumatoid arthritis) ([place])   . Skin cancer (melanoma) (HCC)    Right Arm, nose.     Family History  Problem Relation Age of Onset  .  Heart attack Father 65  . Heart disease Father   . Osteoporosis Sister   . High Cholesterol Sister   . Melanoma Daughter   . Melanoma Daughter    Past Surgical History:  Procedure Laterality Date  . COLONOSCOPY WITH PROPOFOL N/A 11/04/2016   Procedure: COLONOSCOPY WITH PROPOFOL;  Surgeon: Garlan Fair, MD;  Location: [facility];  Service: Endoscopy;  Laterality: N/A;  . FINGER SURGERY    . HIP ARTHROPLASTY Right 11/14/2013   Procedure: RIGHT CANNULATED HIP PINNING;  Surgeon: Alta Corning, MD;  Location: [facility];  Service: Orthopedics;  Laterality: Right;  BIOMET  . 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 Influenza, High Dose Seasonal PF 04/28/2017  . Influenza-Unspecified 04/23/2015     Objective: Vital Signs: BP 112/61 (BP Location: Left Arm, Patient Position: Sitting, Cuff Size: Normal)   Pulse 71   Resp 12   Ht 5' 6.5" (1.689 m)   Wt 133 lb (60.3 kg)   LMP 07/14/1993   BMI 21.15 kg/m    Physical Exam Vitals and nursing note reviewed.  Constitutional:      Appearance: She is well-developed.  HENT:     Head: Normocephalic and atraumatic.  Eyes:     Conjunctiva/sclera: Conjunctivae normal.  Cardiovascular:     Rate and Rhythm: Normal rate and regular rhythm.     Heart sounds: Normal heart sounds.  Pulmonary:     Effort: Pulmonary effort is normal.     Breath sounds: Normal breath sounds.  Abdominal:     General: Bowel sounds are normal.     Palpations: Abdomen is soft.  Musculoskeletal:     Cervical back: Normal range of motion.  Lymphadenopathy:     Cervical: No  cervical adenopathy.  Skin:    General: Skin is warm and dry.     Capillary Refill: Capillary refill takes less than 2 seconds.  Neurological:     Mental Status: She is alert and oriented to person, place, and time.  Psychiatric:        Behavior: Behavior normal.      Musculoskeletal Exam: C-spine, thoracic and lumbar spine with good range of motion.  Shoulder joints, elbow joints, wrist joints with good range of motion.  She has some synovial thickening over MCP joints but no synovitis was noted.  She has DIP and PIP thickening but no synovitis was noted.  Hip joints, knee joints, ankles were in good range of motion.  CDAI Exam: CDAI Score: 0.2  Patient Global: 1 mm; Provider Global: 1 mm Swollen: 0 ; Tender: 0  Joint Exam 09/23/2019   No joint exam has been documented for this visit   There is currently no information documented on the homunculus. Go to the Rheumatology activity and complete the homunculus joint exam.  Investigation:  No additional findings.  Imaging: No results found.  Recent Labs: Lab Results  Component Value Date   WBC 5.3 08/04/2019   HGB 13.1 08/04/2019   PLT 283 08/04/2019   NA 143 08/04/2019   K 4.1 08/04/2019   CL 103 08/04/2019   CO2 28 08/04/2019   GLUCOSE 92 08/04/2019   BUN 13 08/04/2019   CREATININE 0.76 08/04/2019   BILITOT 0.3 08/04/2019   ALKPHOS 77 08/04/2019   AST 21 08/04/2019   ALT 11 08/04/2019   PROT 6.3 08/04/2019   ALBUMIN 4.5 08/04/2019   CALCIUM 9.5 08/04/2019   GFRAA 92 08/04/2019    Speciality Comments: PLQ Eye exam: 04/12/19 WNL @ [facility] associates Follow up in 1 year last   Procedures:  No procedures performed Allergies: Levaquin [levofloxacin] and Cephalexin   Assessment / Plan:     Visit Diagnoses: Rheumatoid arthritis involving multiple joints (HCC) - Positive RF, positive anti-CCP history of elevated ESR ANA negative.  She has been doing well without any flares.  She had one episode of left foot  swelling lasting for about 3 hours which seems unlikely to be rheumatoid arthritis flare.  She has had no recurrence of flare.  We discussed obtaining x-ray of bilateral hands and bilateral feet at the next visit.  High risk medication use - Arava 10 mg daily and Plaquenil 200 mg twice daily Monday through Friday only. Eye exam: 04/12/19.  Her labs have been stable.  Primary osteoarthritis of both hands-joint protection muscle strengthening was discussed.  Primary osteoarthritis of both knees - Bilateral moderate with moderate chondromalacia patella.  She states recently she has been experiencing some discomfort over lateral aspect of her knee joint.  She would like to have x-ray of her left knee joint at the follow-up visit.  Primary osteoarthritis of both feet-she has been using proper fitting shoes which has been helpful.  Osteopenia of multiple sites - h/o Osteoporosis, restarted Fosamax after the last DXA per patient.  She has been tolerating Fosamax well.  History of fracture of right hip  History of pleural effusion - Dr. Lake Bells  Paroxysmal atrial fibrillation [facility])  History of hyperlipidemia  History of bronchiectasis - She is followed up by Dr. Lake Bells on a yearly basis.  Orders: No orders of the defined types were placed in this encounter.  No orders of the defined types were placed in this encounter.   Follow-Up Instructions: Return in about 5 months (around 02/23/2020) for Rheumatoid arthritis.   Bo Merino, MD  Note - This record has been created using Editor, commissioning.  Chart creation errors have been sought, but may not always  have been located. Such creation errors do not reflect on  the standard of medical care.

## 2019-09-23 ENCOUNTER — Encounter: Payer: Self-pay | Admitting: Rheumatology

## 2019-09-23 ENCOUNTER — Other Ambulatory Visit: Payer: Self-pay

## 2019-09-23 ENCOUNTER — Ambulatory Visit (INDEPENDENT_AMBULATORY_CARE_PROVIDER_SITE_OTHER): Payer: PPO | Admitting: Rheumatology

## 2019-09-23 VITALS — BP 112/61 | HR 71 | Resp 12 | Ht 66.5 in | Wt 133.0 lb

## 2019-09-23 DIAGNOSIS — M19072 Primary osteoarthritis, left ankle and foot: Secondary | ICD-10-CM | POA: Diagnosis not present

## 2019-09-23 DIAGNOSIS — M069 Rheumatoid arthritis, unspecified: Secondary | ICD-10-CM

## 2019-09-23 DIAGNOSIS — M19071 Primary osteoarthritis, right ankle and foot: Secondary | ICD-10-CM | POA: Diagnosis not present

## 2019-09-23 DIAGNOSIS — Z8709 Personal history of other diseases of the respiratory system: Secondary | ICD-10-CM

## 2019-09-23 DIAGNOSIS — M19041 Primary osteoarthritis, right hand: Secondary | ICD-10-CM | POA: Diagnosis not present

## 2019-09-23 DIAGNOSIS — M8589 Other specified disorders of bone density and structure, multiple sites: Secondary | ICD-10-CM

## 2019-09-23 DIAGNOSIS — Z8781 Personal history of (healed) traumatic fracture: Secondary | ICD-10-CM | POA: Diagnosis not present

## 2019-09-23 DIAGNOSIS — Z79899 Other long term (current) drug therapy: Secondary | ICD-10-CM | POA: Diagnosis not present

## 2019-09-23 DIAGNOSIS — M19042 Primary osteoarthritis, left hand: Secondary | ICD-10-CM | POA: Diagnosis not present

## 2019-09-23 DIAGNOSIS — Z8639 Personal history of other endocrine, nutritional and metabolic disease: Secondary | ICD-10-CM | POA: Diagnosis not present

## 2019-09-23 DIAGNOSIS — M170 Bilateral primary osteoarthritis of knee: Secondary | ICD-10-CM

## 2019-09-23 DIAGNOSIS — I480 Paroxysmal atrial fibrillation: Secondary | ICD-10-CM

## 2019-09-23 NOTE — Patient Instructions (Signed)
Standing Labs We placed an order today for your standing lab work.    Please come back and get your standing labs in April and every 3 months   We have open lab daily Monday through Thursday from 8:30-12:30 PM and 1:30-4:30 PM and Friday from 8:30-12:30 PM and 1:30-4:00 PM at the office of Dr. Raudel Bazen.   You may experience shorter wait times on Monday and Friday afternoons. The office is located at [address], [address], [place], [place] [address] No appointment is necessary.   Labs are drawn by [facility].  You may receive a bill from [facility] for your lab work.  If you wish to have your labs drawn at another location, please call the office 24 hours in advance to send orders.  If you have any questions regarding directions or hours of operation,  please call [phone].   Just as a reminder please drink plenty of water prior to coming for your lab work. Thanks!   

## 2019-10-12 DIAGNOSIS — E7849 Other hyperlipidemia: Secondary | ICD-10-CM | POA: Diagnosis not present

## 2019-10-19 ENCOUNTER — Telehealth: Payer: Self-pay | Admitting: Rheumatology

## 2019-10-19 DIAGNOSIS — M069 Rheumatoid arthritis, unspecified: Secondary | ICD-10-CM

## 2019-10-19 DIAGNOSIS — E7849 Other hyperlipidemia: Secondary | ICD-10-CM | POA: Diagnosis not present

## 2019-10-19 DIAGNOSIS — M810 Age-related osteoporosis without current pathological fracture: Secondary | ICD-10-CM | POA: Diagnosis not present

## 2019-10-19 DIAGNOSIS — L989 Disorder of the skin and subcutaneous tissue, unspecified: Secondary | ICD-10-CM | POA: Diagnosis not present

## 2019-10-19 DIAGNOSIS — Z0000 Encounter for general adult medical examination without abnormal findings: Secondary | ICD-10-CM | POA: Diagnosis not present

## 2019-10-19 DIAGNOSIS — Z1389 Encounter for screening for other disorder: Secondary | ICD-10-CM | POA: Diagnosis not present

## 2019-10-19 MED ORDER — HYDROXYCHLOROQUINE SULFATE 200 MG PO TABS: ORAL_TABLET | ORAL | 0 refills | Status: DC

## 2019-10-19 NOTE — Telephone Encounter (Signed)
Patient called requesting prescription refill of Hydroxychloroquine to be sent to [facility] at [place].  Patient states she forgot to call in the refill and is now out of medication.

## 2019-10-19 NOTE — Telephone Encounter (Signed)
Last Visit: 09/23/19 Next Visit: 02/24/20 Labs: 08/04/19 CBC and CMP are stable Eye exam: 04/12/19 WNL  Current Dose per office note on 09/23/19: Plaquenil 200 mg twice daily Monday through Friday only.  Okay to refill per Dr. Estanislado Pandy

## 2019-10-27 DIAGNOSIS — W57XXXA Bitten or stung by nonvenomous insect and other nonvenomous arthropods, initial encounter: Secondary | ICD-10-CM | POA: Diagnosis not present

## 2019-10-27 DIAGNOSIS — C44722 Squamous cell carcinoma of skin of right lower limb, including hip: Secondary | ICD-10-CM | POA: Diagnosis not present

## 2019-10-27 DIAGNOSIS — S80262A Insect bite (nonvenomous), left knee, initial encounter: Secondary | ICD-10-CM | POA: Diagnosis not present

## 2019-10-27 DIAGNOSIS — D485 Neoplasm of uncertain behavior of skin: Secondary | ICD-10-CM | POA: Diagnosis not present

## 2019-11-21 DIAGNOSIS — C44722 Squamous cell carcinoma of skin of right lower limb, including hip: Secondary | ICD-10-CM | POA: Diagnosis not present

## 2019-11-25 ENCOUNTER — Other Ambulatory Visit: Payer: Self-pay | Admitting: Rheumatology

## 2019-11-25 NOTE — Telephone Encounter (Addendum)
Last Visit: 09/23/2019 Next Visit: 02/24/2020 Labs: 08/04/2019 Stable  Patient advised she is due to update labs. Patient states she currently has a cold and will update once she is over her cold.   Current Dose per office note 09/23/2019: Arava 10 mg daily   Okay to refill 30 day supply Arava?

## 2019-11-25 NOTE — Telephone Encounter (Signed)
Ok to refill 30 day supply of [place].

## 2019-11-30 ENCOUNTER — Other Ambulatory Visit: Payer: Self-pay

## 2019-11-30 DIAGNOSIS — Z79899 Other long term (current) drug therapy: Secondary | ICD-10-CM

## 2019-11-30 LAB — COMPLETE METABOLIC PANEL WITH GFR
AG Ratio: 1.8 (calc) (ref 1.0–2.5)
ALT: 9 U/L (ref 6–29)
AST: 21 U/L (ref 10–35)
Albumin: 4.2 g/dL (ref 3.6–5.1)
Alkaline phosphatase (APISO): 73 U/L (ref 37–153)
BUN: 18 mg/dL (ref 7–25)
CO2: 31 mmol/L (ref 20–32)
Calcium: 10.1 mg/dL (ref 8.6–10.4)
Chloride: 101 mmol/L (ref 98–110)
Creat: 0.89 mg/dL (ref 0.60–0.93)
GFR, Est African American: 76 mL/min/{1.73_m2} (ref >=60)
GFR, Est Non African American: 66 mL/min/{1.73_m2} (ref >=60)
Globulin: 2.3 g/dL (calc) (ref 1.9–3.7)
Glucose, Bld: 90 mg/dL (ref 65–99)
Potassium: 4.4 mmol/L (ref 3.5–5.3)
Sodium: 141 mmol/L (ref 135–146)
Total Bilirubin: 0.4 mg/dL (ref 0.2–1.2)
Total Protein: 6.5 g/dL (ref 6.1–8.1)

## 2019-11-30 LAB — CBC WITH DIFFERENTIAL/PLATELET
Absolute Monocytes: 557 cells/uL (ref 200–950)
Basophils Absolute: 52 cells/uL (ref 0–200)
Basophils Relative: 0.9 %
Eosinophils Absolute: 157 cells/uL (ref 15–500)
Eosinophils Relative: 2.7 %
HCT: 39.7 % (ref 35.0–45.0)
Hemoglobin: 13 g/dL (ref 11.7–15.5)
Lymphs Abs: 1282 cells/uL (ref 850–3900)
MCH: 31.5 pg (ref 27.0–33.0)
MCHC: 32.7 g/dL (ref 32.0–36.0)
MCV: 96.1 fL (ref 80.0–100.0)
MPV: 10.2 fL (ref 7.5–12.5)
Monocytes Relative: 9.6 %
Neutro Abs: 3753 cells/uL (ref 1500–7800)
Neutrophils Relative %: 64.7 %
Platelets: 307 10*3/uL (ref 140–400)
RBC: 4.13 10*6/uL (ref 3.80–5.10)
RDW: 11.8 % (ref 11.0–15.0)
Total Lymphocyte: 22.1 %
WBC: 5.8 10*3/uL (ref 3.8–10.8)

## 2019-12-01 NOTE — Progress Notes (Signed)
CBC and CMP are normal.

## 2019-12-19 DIAGNOSIS — L570 Actinic keratosis: Secondary | ICD-10-CM | POA: Diagnosis not present

## 2019-12-19 DIAGNOSIS — Z85828 Personal history of other malignant neoplasm of skin: Secondary | ICD-10-CM | POA: Diagnosis not present

## 2019-12-19 DIAGNOSIS — D0461 Carcinoma in situ of skin of right upper limb, including shoulder: Secondary | ICD-10-CM | POA: Diagnosis not present

## 2019-12-19 DIAGNOSIS — D485 Neoplasm of uncertain behavior of skin: Secondary | ICD-10-CM | POA: Diagnosis not present

## 2019-12-19 DIAGNOSIS — D225 Melanocytic nevi of trunk: Secondary | ICD-10-CM | POA: Diagnosis not present

## 2019-12-19 DIAGNOSIS — B078 Other viral warts: Secondary | ICD-10-CM | POA: Diagnosis not present

## 2019-12-19 DIAGNOSIS — L578 Other skin changes due to chronic exposure to nonionizing radiation: Secondary | ICD-10-CM | POA: Diagnosis not present

## 2019-12-19 DIAGNOSIS — L814 Other melanin hyperpigmentation: Secondary | ICD-10-CM | POA: Diagnosis not present

## 2019-12-19 DIAGNOSIS — Z85820 Personal history of malignant melanoma of skin: Secondary | ICD-10-CM | POA: Diagnosis not present

## 2019-12-19 DIAGNOSIS — L821 Other seborrheic keratosis: Secondary | ICD-10-CM | POA: Diagnosis not present

## 2019-12-27 ENCOUNTER — Other Ambulatory Visit: Payer: Self-pay | Admitting: *Deleted

## 2019-12-27 MED ORDER — LEFLUNOMIDE 10 MG PO TABS: 10.0000 mg | ORAL_TABLET | Freq: Every day | ORAL | 0 refills | Status: DC

## 2019-12-27 NOTE — Telephone Encounter (Signed)
Refill request received via fax  Last Visit: 09/23/2019 Next Visit: 02/24/2020 Labs: 11/30/2019 CBC and CMP are normal.   Current Dose per office note 09/23/2019: Arava 10 mg daily   Okay to refill per Dr. Estanislado Pandy

## 2020-01-12 ENCOUNTER — Telehealth: Payer: Self-pay | Admitting: Rheumatology

## 2020-01-12 DIAGNOSIS — M069 Rheumatoid arthritis, unspecified: Secondary | ICD-10-CM

## 2020-01-12 MED ORDER — HYDROXYCHLOROQUINE SULFATE 200 MG PO TABS: ORAL_TABLET | ORAL | 0 refills | Status: DC

## 2020-01-12 NOTE — Telephone Encounter (Signed)
Patient took last Plaquenil dose today. Patient thought she picked up rx, but does not have any. Patient is due for next dose tonight. Patient requests a refill to be sent into pharmacy.Patient uses [facility] on [address].

## 2020-01-12 NOTE — Telephone Encounter (Signed)
Last Visit: 09/23/2019 Next visit: 02/24/2020 Labs: 11/30/2019 CBC and CMP are normal.  PLQ Eye exam: 04/12/19 WNL  Current Dose per office note on 09/23/2019: Plaquenil 200 mg twice daily Monday through Friday only.   Okay to refill per Dr. Estanislado Pandy

## 2020-01-19 DIAGNOSIS — D0461 Carcinoma in situ of skin of right upper limb, including shoulder: Secondary | ICD-10-CM | POA: Diagnosis not present

## 2020-01-19 DIAGNOSIS — L720 Epidermal cyst: Secondary | ICD-10-CM | POA: Diagnosis not present

## 2020-01-19 DIAGNOSIS — L570 Actinic keratosis: Secondary | ICD-10-CM | POA: Diagnosis not present

## 2020-01-24 ENCOUNTER — Other Ambulatory Visit: Payer: Self-pay | Admitting: *Deleted

## 2020-01-24 MED ORDER — LEFLUNOMIDE 10 MG PO TABS: 10.0000 mg | ORAL_TABLET | Freq: Every day | ORAL | 0 refills | Status: DC

## 2020-01-24 NOTE — Telephone Encounter (Signed)
Refill request received via fax  Last Visit:09/23/2019 Next Visit:02/24/2020 Labs: 11/30/2019 CBC and CMP are normal.   Current Dose per office note 09/23/2019: Arava 10 mg daily   Okay to refill per Dr. Estanislado Pandy

## 2020-02-17 NOTE — Progress Notes (Signed)
Office Visit Note  Patient: [name] [name]             Date of Birth: 06/11/1951           MRN: [id number]             PCP: Levin Erp, MD Referring: Levin Erp, MD Visit Date: 02/29/2020 Occupation: @GUAROCC @  Subjective:  Medication monitoring   History of Present Illness: Alga Southall is a 69 y.o. female with history of rheumatoid arthritis and osteoarthritis.  She is taking arava 10 mg 1 tablet by mouth daily and plaquenil 200 mg 1 tablet by mouth twice daily M-F.  She denies any recent rheumatoid arthritis flares.  She denies any joint pain or joint swelling.  She denies any morning stiffness.  She denies any new concerns at this time. She has received the covid 19 vaccinations and is planning on receiving the vaccination.   Activities of Daily Living:  Patient reports morning stiffness for 0 minutes.   Patient Denies nocturnal pain.  Difficulty dressing/grooming: Denies Difficulty climbing stairs: Denies Difficulty getting out of chair: Denies Difficulty using hands for taps, buttons, cutlery, and/or writing: Denies  Review of Systems  Constitutional: Positive for fatigue.  HENT: Negative for mouth sores, mouth dryness and nose dryness.   Eyes: Negative for itching and dryness.  Respiratory: Negative for shortness of breath and difficulty breathing.   Cardiovascular: Negative for chest pain and palpitations.  Gastrointestinal: Negative for blood in stool, constipation and diarrhea.  Endocrine: Negative for increased urination.  Genitourinary: Negative for difficulty urinating.  Musculoskeletal: Negative for arthralgias, joint pain, joint swelling, myalgias, morning stiffness, muscle tenderness and myalgias.  Skin: Negative for color change, rash and redness.  Allergic/Immunologic: Negative for susceptible to infections.  Neurological: Negative for dizziness, numbness, headaches, memory loss and weakness.  Hematological: Positive for bruising/bleeding tendency.    Psychiatric/Behavioral: Negative for confusion.    PMFS History:  Patient Active Problem List   Diagnosis Date Noted  . Primary osteoarthritis of both knees 01/08/2017  . Primary osteoarthritis of both feet 01/08/2017  . History of pleural effusion 12/01/2016  . History of recurrent pneumonia 12/01/2016  . History of hyperlipidemia 12/01/2016  . High risk medication use 12/01/2016  . Trigger thumb, left thumb 12/01/2016  . Elevated sed rate 12/01/2016  . History of fracture of right hip 12/01/2016  . Primary osteoarthritis of both hands 12/01/2016  . Rheumatoid arthritis involving multiple joints ([place]) 04/09/2015  . HCAP (healthcare-associated pneumonia)   . Paroxysmal atrial fibrillation (HCC)   . Tendonitis 04/03/2015  . Pleural effusion associated with pulmonary infection 03/27/2015  . Orthostatic syncope   . Syncope 03/18/2015  . Osteoporosis   . Hyperlipidemia 11/14/2013    Past Medical History:  Diagnosis Date  . Elevated cholesterol   . Migraines    occular  . Osteoporosis 2015   based on hip fracture from fall  DEXA 01/2018 T score -2.3  . Pleural effusion 9/206   Bilateral effusions, s/p thora on R c/w exudate  . Pneumonia 03/2015   R  . RA (rheumatoid arthritis) ([place])   . Skin cancer (melanoma) (HCC)    Right Arm, nose.     Family History  Problem Relation Age of Onset  . Heart attack Father 47  . Heart disease Father   . Osteoporosis Sister   . High Cholesterol Sister   . Melanoma Daughter   . Melanoma Daughter    Past Surgical History:  Procedure Laterality Date  .  COLONOSCOPY WITH PROPOFOL N/A 11/04/2016   Procedure: COLONOSCOPY WITH PROPOFOL;  Surgeon: Garlan Fair, MD;  [facility];  Service: Endoscopy;  Laterality: N/A;  . FINGER SURGERY    . HIP ARTHROPLASTY Right 11/14/2013   Procedure: RIGHT CANNULATED HIP PINNING;  Surgeon: Alta Corning, MD;  Location: [facility];  Service: Orthopedics;  Laterality: Right;  BIOMET  . SQUAMOUS CELL  CARCINOMA EXCISION  2021   behind knee   . 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 Influenza, High Dose Seasonal PF 04/28/2017  . Influenza-Unspecified 04/23/2015     Objective: Vital Signs: BP 104/63 (BP Location: Left Arm, Patient Position: Sitting, Cuff Size: Normal)   Pulse 73   Resp 13   Ht 5' 6.5" (1.689 m)   Wt 131 lb (59.4 kg)   LMP 07/14/1993   BMI 20.83 kg/m    Physical Exam Vitals and nursing note reviewed.  Constitutional:      Appearance: She is well-developed.  HENT:     Head: Normocephalic and atraumatic.  Eyes:     Conjunctiva/sclera: Conjunctivae normal.  Pulmonary:     Effort: Pulmonary effort is normal.  Abdominal:     General: Bowel sounds are normal.     Palpations: Abdomen is soft.  Musculoskeletal:     Cervical back: Normal range of motion.  Lymphadenopathy:     Cervical: No cervical adenopathy.  Skin:    General: Skin is warm and dry.     Capillary Refill: Capillary refill takes less than 2 seconds.  Neurological:     Mental Status: She is alert and oriented to person, place, and time.  Psychiatric:        Behavior: Behavior normal.      Musculoskeletal Exam: C-spine, thoracic spine, and lumbar spine good ROM.  Shoulder joints, elbow joints, wrist joints, MCPs ,PIPs, and DIPs good ROM with no synovitis. Synovial thickening of the 2nd and 3rd MCP joints.  Synovial thickening of the left 3rd MCP joint. PIP and DIP thickening consistent with osteoarthritis of both hands.  Hip joints, knee joints, ankle joints, MTPs good ROM with no discomfort.  No  warmth or effusion of knee joints.  No tenderness or swelling of ankle joints.  CDAI Exam: CDAI Score: -- Patient Global: --; Provider Global: -- Swollen: --; Tender: -- Joint Exam 02/29/2020   No joint exam has been documented for this visit   There is currently no information documented on the homunculus. Go to the Rheumatology activity and complete the homunculus joint exam.  Investigation: No additional findings.  Imaging: No results found.  Recent Labs: Lab Results  Component Value Date   WBC 5.8 11/30/2019   HGB 13.0 11/30/2019   PLT 307 11/30/2019   NA 141 11/30/2019   K 4.4 11/30/2019   CL 101 11/30/2019   CO2 31 11/30/2019   GLUCOSE 90 11/30/2019   BUN 18 11/30/2019   CREATININE 0.89 11/30/2019   BILITOT 0.4 11/30/2019   ALKPHOS 77 08/04/2019   AST 21 11/30/2019   ALT 9 11/30/2019   PROT 6.5 11/30/2019   ALBUMIN 4.5  08/04/2019   CALCIUM 10.1 11/30/2019   GFRAA 76 11/30/2019    Speciality Comments: PLQ Eye exam: 04/12/19 WNL @ [facility] associates Follow up in 1 year last   Procedures:  No procedures performed Allergies: Levaquin [levofloxacin] and Cephalexin   Assessment / Plan:     Visit Diagnoses: Rheumatoid arthritis involving multiple joints ([place]) -She has not had any signs or symptoms of a rheumatoid arthritis flare recently.  She has no tenderness or synovitis on examination today.  She is clinically doing well on Arava 10 mg 1 tablet by mouth daily and Plaquenil 200 mg 1 tablet twice daily Monday through Friday.  She is tolerating both medications without any side effects.  She is not experiencing any joint pain or inflammation at this time.  X-rays of both hands and feet were obtained today to assess for radiographic progression.  She will continue taking low-dose Arava and Plaquenil as prescribed.  She does not need any refills at this time.  She was advised to notify us if she develops increased joint pain or joint swelling.  She will follow-up  in the office in 5 months.  Plan: XR Hand 2 View Right, XR Hand 2 View Left, XR Foot 2 Views Right, XR Foot 2 Views Left.  X-rays were consistent with rheumatoid arthritis and osteoarthritis overlap.  No radiographic progression was noted.  Findings were discussed with patient.  High risk medication use - Arava 10 mg daily, PLQ 200 mg BID M-F.  CBC and CMP were within normal limits on 11/30/2019.  She is due to update lab work today.  Orders for CBC and CMP were released.  She will be due to update lab work in November and every 3 months to monitor for drug toxicity.  Standing orders for CBC and CMP are in place. PLQ Eye exam: 04/12/19 WNL @ [facility] associates Follow up in 1 year last .- Plan: CBC with Differential/Platelet, COMPLETE METABOLIC PANEL WITH GFR She has received both COVID-19 vaccinations and is planning on receiving a booster vaccine.  She was advised to notify us or her PCP if she develops a COVID-19 infection in order to receive the antibody infusion.  She was encouraged to continue to wear a mask and social distance.  We also discussed the importance of holding [place] if she develops signs or symptoms of an infection and to resume once the infection has completely cleared.  She voiced understanding.  Primary osteoarthritis of both hands: She has PIP and DIP thickening consistent with osteoarthritis of both hands.  No tenderness or inflammation was noted.  She has complete fist formation bilaterally.  Joint protection and muscle strengthening were discussed.  Primary osteoarthritis of both knees: She has good range of motion of both knee joints on exam.  She has been experiencing discomfort at the distal insertion site of the left IT band.  She was encouraged to use Voltaren gel topically and perform stretching exercises.  X-rays of the left knee joint were updated today and were reviewed with the patient today in the office.  Chronic pain of left knee -She experiences intermittent  discomfort on the lateral aspect of the left knee.  She has tenderness at the distal insertion site of the IT band.  She was encouraged use Voltaren gel topically as needed.  X-rays of the left knee joint were obtained today and reviewed with the patient today in the office.  Plan: XR KNEE 3 VIEW LEFT  Primary osteoarthritis of both  feet: She is not experiencing any discomfort in her feet at this time.  She was proper fitting shoes.  She has no tenderness over MTP joints.  Osteopenia of multiple sites -She is taking Fosamax 70 mg 1 tablet by mouth once weekly.  DEXA 02/11/2018 T score -2.4 BMD 0.449 left forearm.  She is due to update her bone density.  Other medical conditions are listed as follows:   History of fracture of right hip  History of pleural effusion  Paroxysmal atrial fibrillation (HCC)  History of hyperlipidemia  History of bronchiectasis   Orders: Orders Placed This Encounter  Procedures  . XR Hand 2 View Right  . XR Hand 2 View Left  . XR Foot 2 Views Right  . XR Foot 2 Views Left  . XR KNEE 3 VIEW LEFT  . CBC with Differential/Platelet  . COMPLETE METABOLIC PANEL WITH GFR   No orders of the defined types were placed in this encounter.     Follow-Up Instructions: Return in about 5 months (around 07/31/2020) for Rheumatoid arthritis, Osteoarthritis.   Ofilia Neas, PA-C  Note - This record has been created using Dragon software.  Chart creation errors have been sought, but may not always  have been located. Such creation errors do not reflect on  the standard of medical care.

## 2020-02-23 ENCOUNTER — Other Ambulatory Visit: Payer: Self-pay | Admitting: Rheumatology

## 2020-02-23 NOTE — Telephone Encounter (Addendum)
Last Visit:09/23/2019 Next Visit:02/29/2020 Labs:5/19/2021CBC and CMP are normal.  Current Dose per office note3/06/2020:Arava 10 mg daily  Okay to refillper Dr. Estanislado Pandy

## 2020-02-24 ENCOUNTER — Ambulatory Visit: Payer: PPO | Admitting: Rheumatology

## 2020-02-29 ENCOUNTER — Ambulatory Visit (INDEPENDENT_AMBULATORY_CARE_PROVIDER_SITE_OTHER): Payer: PPO

## 2020-02-29 ENCOUNTER — Ambulatory Visit: Payer: Self-pay

## 2020-02-29 ENCOUNTER — Encounter: Payer: Self-pay | Admitting: Physician Assistant

## 2020-02-29 ENCOUNTER — Ambulatory Visit (INDEPENDENT_AMBULATORY_CARE_PROVIDER_SITE_OTHER): Payer: PPO | Admitting: Physician Assistant

## 2020-02-29 ENCOUNTER — Other Ambulatory Visit: Payer: Self-pay

## 2020-02-29 VITALS — BP 104/63 | HR 73 | Resp 13 | Ht 66.5 in | Wt 131.0 lb

## 2020-02-29 DIAGNOSIS — M19042 Primary osteoarthritis, left hand: Secondary | ICD-10-CM

## 2020-02-29 DIAGNOSIS — M19071 Primary osteoarthritis, right ankle and foot: Secondary | ICD-10-CM | POA: Diagnosis not present

## 2020-02-29 DIAGNOSIS — I480 Paroxysmal atrial fibrillation: Secondary | ICD-10-CM | POA: Diagnosis not present

## 2020-02-29 DIAGNOSIS — M19072 Primary osteoarthritis, left ankle and foot: Secondary | ICD-10-CM

## 2020-02-29 DIAGNOSIS — Z79899 Other long term (current) drug therapy: Secondary | ICD-10-CM

## 2020-02-29 DIAGNOSIS — M19041 Primary osteoarthritis, right hand: Secondary | ICD-10-CM | POA: Diagnosis not present

## 2020-02-29 DIAGNOSIS — M170 Bilateral primary osteoarthritis of knee: Secondary | ICD-10-CM

## 2020-02-29 DIAGNOSIS — Z8639 Personal history of other endocrine, nutritional and metabolic disease: Secondary | ICD-10-CM | POA: Diagnosis not present

## 2020-02-29 DIAGNOSIS — M25562 Pain in left knee: Secondary | ICD-10-CM

## 2020-02-29 DIAGNOSIS — Z8709 Personal history of other diseases of the respiratory system: Secondary | ICD-10-CM

## 2020-02-29 DIAGNOSIS — Z8781 Personal history of (healed) traumatic fracture: Secondary | ICD-10-CM

## 2020-02-29 DIAGNOSIS — G8929 Other chronic pain: Secondary | ICD-10-CM | POA: Diagnosis not present

## 2020-02-29 DIAGNOSIS — M069 Rheumatoid arthritis, unspecified: Secondary | ICD-10-CM

## 2020-02-29 DIAGNOSIS — M8589 Other specified disorders of bone density and structure, multiple sites: Secondary | ICD-10-CM

## 2020-02-29 NOTE — Patient Instructions (Signed)
Standing Labs We placed an order today for your standing lab work.   Please have your standing labs drawn in November and every 3 months   If possible, please have your labs drawn 2 weeks prior to your appointment so that the provider can discuss your results at your appointment.  We have open lab daily Monday through Thursday from 8:30-12:30 PM and 1:30-4:30 PM and Friday from 8:30-12:30 PM and 1:30-4:00 PM at the office of Dr. Bo Merino, [facility].   Please be advised, patients with office appointments requiring lab work will take precedents over walk-in lab work.  If possible, please come for your lab work on Monday and Friday afternoons, as you may experience shorter wait times. The office is located at [address], [place], [place], [place] [address] No appointment is necessary.   Labs are drawn by [facility]. Please bring your co-pay at the time of your lab draw.  You may receive a bill from [facility] for your lab work.  If you wish to have your labs drawn at another location, please call the office 24 hours in advance to send orders.  If you have any questions regarding directions or hours of operation,  please call [phone].   As a reminder, please drink plenty of water prior to coming for your lab work. Thanks!   COVID-19 vaccine recommendations:   COVID-19 vaccine is recommended for everyone (unless you are allergic to a vaccine component), even if you are on a medication that suppresses your immune system.   If you are on Methotrexate, Cellcept (mycophenolate), Rinvoq, Morrie Sheldon, and Olumiant- hold the medication for 1 week after each vaccine. Hold Methotrexate for 2 weeks after the single dose COVID-19 vaccine.   If you are on Orencia subcutaneous injection - hold medication one week prior to and one week after the first COVID-19 vaccine dose (only).   If you are on Orencia IV infusions- time vaccination administration so that the first COVID-19  vaccination will occur four weeks after the infusion and postpone the subsequent infusion by one week.   If you are on Cyclophosphamide or Rituxan infusions please contact your doctor prior to receiving the COVID-19 vaccine.   Do not take Tylenol or any anti-inflammatory medications (NSAIDs) 24 hours prior to the COVID-19 vaccination.   There is no direct evidence about the efficacy of the COVID-19 vaccine in individuals who are on medications that suppress the immune system.   Even if you are fully vaccinated, and you are on any medications that suppress your immune system, please continue to wear a mask, maintain at least six feet social distance and practice hand hygiene.   If you develop a COVID-19 infection, please contact your PCP or our office to determine if you need antibody infusion.  The booster vaccine is now available for immunocompromised patients. It is advised that if you had Pfizer vaccine you should get [employer] booster.  If you had a Moderna vaccine then you should get a Moderna booster. Johnson and Wynetta Emery does not have a booster vaccine at this time.  Please see the following web sites for updated information.   https://[email or web address].org/[email or web address]/0/Files/COVID-19-Vaccination-Patient-Resources.pdf  https://www.[email or web address].org/About-Us/Newsroom/Press-Releases/ID/1159

## 2020-03-01 LAB — COMPLETE METABOLIC PANEL WITH GFR
AG Ratio: 1.8 (calc) (ref 1.0–2.5)
ALT: 10 U/L (ref 6–29)
AST: 26 U/L (ref 10–35)
Albumin: 4.2 g/dL (ref 3.6–5.1)
Alkaline phosphatase (APISO): 69 U/L (ref 37–153)
BUN: 16 mg/dL (ref 7–25)
CO2: 34 mmol/L — ABNORMAL HIGH (ref 20–32)
Calcium: 9.9 mg/dL (ref 8.6–10.4)
Chloride: 101 mmol/L (ref 98–110)
Creat: 0.85 mg/dL (ref 0.60–0.93)
GFR, Est African American: 80 mL/min/{1.73_m2} (ref >=60)
GFR, Est Non African American: 69 mL/min/{1.73_m2} (ref >=60)
Globulin: 2.3 g/dL (calc) (ref 1.9–3.7)
Glucose, Bld: 105 mg/dL — ABNORMAL HIGH (ref 65–99)
Potassium: 4.3 mmol/L (ref 3.5–5.3)
Sodium: 141 mmol/L (ref 135–146)
Total Bilirubin: 0.5 mg/dL (ref 0.2–1.2)
Total Protein: 6.5 g/dL (ref 6.1–8.1)

## 2020-03-01 LAB — CBC WITH DIFFERENTIAL/PLATELET
Absolute Monocytes: 586 cells/uL (ref 200–950)
Basophils Absolute: 53 cells/uL (ref 0–200)
Basophils Relative: 1.1 %
Eosinophils Absolute: 139 cells/uL (ref 15–500)
Eosinophils Relative: 2.9 %
HCT: 38.1 % (ref 35.0–45.0)
Hemoglobin: 12.6 g/dL (ref 11.7–15.5)
Lymphs Abs: 1238 cells/uL (ref 850–3900)
MCH: 31.8 pg (ref 27.0–33.0)
MCHC: 33.1 g/dL (ref 32.0–36.0)
MCV: 96.2 fL (ref 80.0–100.0)
MPV: 10.2 fL (ref 7.5–12.5)
Monocytes Relative: 12.2 %
Neutro Abs: 2784 cells/uL (ref 1500–7800)
Neutrophils Relative %: 58 %
Platelets: 281 10*3/uL (ref 140–400)
RBC: 3.96 10*6/uL (ref 3.80–5.10)
RDW: 11.7 % (ref 11.0–15.0)
Total Lymphocyte: 25.8 %
WBC: 4.8 10*3/uL (ref 3.8–10.8)

## 2020-03-01 NOTE — Progress Notes (Signed)
CBC WNL.  Co2 borderline elevated.  Rest of CMP WNL.

## 2020-03-23 ENCOUNTER — Other Ambulatory Visit: Payer: Self-pay | Admitting: Rheumatology

## 2020-03-23 NOTE — Telephone Encounter (Signed)
Last Visit: 02/29/2020 Next Visit: 08/01/2020 Labs: 02/29/2020 CBC WNL. Co2 borderline elevated. Rest of CMP WNL.   Current Dose per office note 02/29/2020: Arava 10 mg daily DX: Rheumatoid arthritis involving multiple joints   Okay to refill per Dr. Estanislado Pandy

## 2020-04-09 ENCOUNTER — Other Ambulatory Visit: Payer: Self-pay | Admitting: Rheumatology

## 2020-04-09 DIAGNOSIS — M069 Rheumatoid arthritis, unspecified: Secondary | ICD-10-CM

## 2020-04-09 NOTE — Telephone Encounter (Signed)
Last Visit: 02/29/2020 Next Visit: 08/02/2019 Labs: CBC WNL. Co2 borderline elevated.Rest of CMP WNL.  Eye exam: 04/12/2019 Patient has an appointment next week on 04/17/2020 for updated PLQ Eye Exam.   Current Dose per office note 02/29/2020: Plaquenil 200 mg 1 tablet twice daily Monday through Friday DX: Rheumatoid arthritis involving multiple joints  Okay to refill Plaquenil?

## 2020-04-17 DIAGNOSIS — Z79899 Other long term (current) drug therapy: Secondary | ICD-10-CM | POA: Diagnosis not present

## 2020-04-17 DIAGNOSIS — M069 Rheumatoid arthritis, unspecified: Secondary | ICD-10-CM | POA: Diagnosis not present

## 2020-04-17 DIAGNOSIS — H2513 Age-related nuclear cataract, bilateral: Secondary | ICD-10-CM | POA: Diagnosis not present

## 2020-04-17 DIAGNOSIS — H524 Presbyopia: Secondary | ICD-10-CM | POA: Diagnosis not present

## 2020-06-20 ENCOUNTER — Other Ambulatory Visit: Payer: Self-pay | Admitting: Rheumatology

## 2020-06-20 NOTE — Telephone Encounter (Addendum)
Last Visit: 02/29/2020 Next Visit: 08/01/2020 Labs: 02/29/2020 CBC WNL. Co2 borderline elevated. Rest of CMP WNL.   Current Dose per office note 02/29/2020: Arava 10 mg daily DX: Rheumatoid arthritis involving multiple joints  Patient advised she is due to update labs. Patient states she will come to the office tomorrow morning to update.

## 2020-06-22 ENCOUNTER — Telehealth: Payer: Self-pay | Admitting: Rheumatology

## 2020-06-22 ENCOUNTER — Other Ambulatory Visit: Payer: Self-pay | Admitting: *Deleted

## 2020-06-22 DIAGNOSIS — Z79899 Other long term (current) drug therapy: Secondary | ICD-10-CM

## 2020-06-22 LAB — COMPLETE METABOLIC PANEL WITH GFR
AG Ratio: 2 (calc) (ref 1.0–2.5)
ALT: 9 U/L (ref 6–29)
AST: 21 U/L (ref 10–35)
Albumin: 4.3 g/dL (ref 3.6–5.1)
Alkaline phosphatase (APISO): 63 U/L (ref 37–153)
BUN/Creatinine Ratio: 17 (calc) (ref 6–22)
BUN: 16 mg/dL (ref 7–25)
CO2: 33 mmol/L — ABNORMAL HIGH (ref 20–32)
Calcium: 9.8 mg/dL (ref 8.6–10.4)
Chloride: 103 mmol/L (ref 98–110)
Creat: 0.95 mg/dL — ABNORMAL HIGH (ref 0.60–0.93)
GFR, Est African American: 70 mL/min/{1.73_m2} (ref >=60)
GFR, Est Non African American: 60 mL/min/{1.73_m2} (ref >=60)
Globulin: 2.2 g/dL (calc) (ref 1.9–3.7)
Glucose, Bld: 75 mg/dL (ref 65–99)
Potassium: 4.2 mmol/L (ref 3.5–5.3)
Sodium: 141 mmol/L (ref 135–146)
Total Bilirubin: 0.4 mg/dL (ref 0.2–1.2)
Total Protein: 6.5 g/dL (ref 6.1–8.1)

## 2020-06-22 LAB — CBC WITH DIFFERENTIAL/PLATELET
Absolute Monocytes: 624 cells/uL (ref 200–950)
Basophils Absolute: 88 cells/uL (ref 0–200)
Basophils Relative: 1.4 %
Eosinophils Absolute: 391 cells/uL (ref 15–500)
Eosinophils Relative: 6.2 %
HCT: 38.3 % (ref 35.0–45.0)
Hemoglobin: 12.8 g/dL (ref 11.7–15.5)
Lymphs Abs: 1940 cells/uL (ref 850–3900)
MCH: 32.1 pg (ref 27.0–33.0)
MCHC: 33.4 g/dL (ref 32.0–36.0)
MCV: 96 fL (ref 80.0–100.0)
MPV: 10.2 fL (ref 7.5–12.5)
Monocytes Relative: 9.9 %
Neutro Abs: 3257 cells/uL (ref 1500–7800)
Neutrophils Relative %: 51.7 %
Platelets: 304 10*3/uL (ref 140–400)
RBC: 3.99 10*6/uL (ref 3.80–5.10)
RDW: 11.7 % (ref 11.0–15.0)
Total Lymphocyte: 30.8 %
WBC: 6.3 10*3/uL (ref 3.8–10.8)

## 2020-06-22 NOTE — Telephone Encounter (Signed)
Patient here for lab draw now, but would like to know Dr. Arlean Hopping opinion on her going to see, and treat a patient at their home. Patient not sure if person is vaccinated. Patient is required to wear a face shield, and mask. Please advise.

## 2020-06-25 NOTE — Telephone Encounter (Signed)
We recommend following all of the necessary precautions recommended by the CDC, but she will have to make the decision for herself if she feels comfortable about going in to patients homes.

## 2020-06-25 NOTE — Telephone Encounter (Signed)
Patient advised Dr. Estanislado Pandy recommends following all of the necessary precautions recommended by the CDC, but she will have to make the decision for herself if she feels comfortable about going in to patients homes. Patient expressed understanding.

## 2020-07-19 NOTE — Progress Notes (Signed)
Office Visit Note  Patient: [name] [name]             Date of Birth: 1951-05-02           MRN: [id number]             PCP: Nila Nephew, MD Referring: Nila Nephew, MD Visit Date: 08/01/2020 Occupation: @GUAROCC @  Subjective:  Medication Management   History of Present Illness: [name] [name] is a 70 y.o. female with history of rheumatoid arthritis and osteoarthritis overlap.  She states she has been doing well on combination of leflunomide 10 mg p.o. daily and Plaquenil 200 mg twice daily Monday to Friday.  She has not noticed any joint swelling.  She continues to have some discomfort due to underlying osteoarthritis.  She has noticed that her PIP and DIP joints have become more prominent.  She states she had all her COVID vaccination and the booster.  Activities of Daily Living:  Patient reports morning stiffness for 0 minutes.   Patient Denies nocturnal pain.  Difficulty dressing/grooming: Denies Difficulty climbing stairs: Denies Difficulty getting out of chair: Denies Difficulty using hands for taps, buttons, cutlery, and/or writing: Denies  Review of Systems  Constitutional: Negative for fatigue.  HENT: Negative for mouth sores, mouth dryness and nose dryness.   Eyes: Negative for pain, itching and dryness.  Respiratory: Negative for shortness of breath and difficulty breathing.   Cardiovascular: Negative for chest pain and palpitations.  Gastrointestinal: Negative for blood in stool, constipation and diarrhea.  Endocrine: Negative for increased urination.  Genitourinary: Negative for difficulty urinating.  Musculoskeletal: Negative for arthralgias, joint pain, joint swelling, myalgias, morning stiffness, muscle tenderness and myalgias.  Skin: Negative for color change, rash and redness.  Allergic/Immunologic: Negative for susceptible to infections.  Neurological: Negative for dizziness, numbness, headaches, memory loss and weakness.  Hematological: Positive for  bruising/bleeding tendency.  Psychiatric/Behavioral: Negative for confusion.    PMFS History:  Patient Active Problem List   Diagnosis Date Noted   Primary osteoarthritis of both knees 01/08/2017   Primary osteoarthritis of both feet 01/08/2017   History of pleural effusion 12/01/2016   History of recurrent pneumonia 12/01/2016   History of hyperlipidemia 12/01/2016   High risk medication use 12/01/2016   Trigger thumb, left thumb 12/01/2016   Elevated sed rate 12/01/2016   History of fracture of right hip 12/01/2016   Primary osteoarthritis of both hands 12/01/2016   Rheumatoid arthritis involving multiple joints (HCC) 04/09/2015   HCAP (healthcare-associated pneumonia)    Paroxysmal atrial fibrillation (HCC)    Tendonitis 04/03/2015   Pleural effusion associated with pulmonary infection 03/27/2015   Orthostatic syncope    Syncope 03/18/2015   Osteoporosis    Hyperlipidemia 11/14/2013    Past Medical History:  Diagnosis Date   Elevated cholesterol    Migraines    occular   Osteoporosis 2015   based on hip fracture from fall  DEXA 01/2018 T score -2.3   Pleural effusion 9/206   Bilateral effusions, s/p thora on R c/w exudate   Pneumonia 03/2015   R   RA (rheumatoid arthritis) (HCC)    Skin cancer (melanoma) (HCC)    Right Arm, nose.     Family History  Problem Relation Age of Onset   Heart attack Father 39   Heart disease Father    Osteoporosis Sister    High Cholesterol Sister    Melanoma Daughter    Melanoma Daughter    Past Surgical History:  Procedure Laterality Date  COLONOSCOPY WITH PROPOFOL N/A 11/04/2016   Procedure: COLONOSCOPY WITH PROPOFOL;  Surgeon: Charolett Bumpers, MD;  Location: [facility];  Service: Endoscopy;  Laterality: N/A;   FINGER SURGERY     HIP ARTHROPLASTY Right 11/14/2013   Procedure: RIGHT CANNULATED HIP PINNING;  Surgeon: Harvie Junior, MD;  Location: [facility];  Service: Orthopedics;  Laterality:  Right;  BIOMET   SQUAMOUS CELL CARCINOMA EXCISION  2021   behind knee    VIDEO BRONCHOSCOPY Bilateral 04/05/2015   Procedure: VIDEO BRONCHOSCOPY WITHOUT FLUORO;  Surgeon: Lupita Leash, MD;  Location: [facility];  Service: Cardiopulmonary;  Laterality: Bilateral;   Social History   Social History Narrative   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  Constitutional:      Appearance: She is well-developed and well-nourished.  HENT:     Head: Normocephalic and atraumatic.  Eyes:     Extraocular Movements: EOM normal.     Conjunctiva/sclera: Conjunctivae normal.  Cardiovascular:     Rate and Rhythm: Normal rate and regular rhythm.     Pulses: Intact distal pulses.     Heart sounds: Normal heart sounds.  Pulmonary:     Effort: Pulmonary effort is normal.     Breath sounds: Normal breath sounds.  Abdominal:     General: Bowel sounds are normal.     Palpations: Abdomen is soft.  Musculoskeletal:     Cervical back: Normal range of motion.  Lymphadenopathy:     Cervical: No cervical adenopathy.  Skin:    General: Skin is warm and dry.     Capillary Refill: Capillary refill takes less than 2 seconds.  Neurological:     Mental Status: She is alert and oriented to person, place, and time.  Psychiatric:        Mood and Affect: Mood and affect normal.        Behavior: Behavior normal.      Musculoskeletal Exam: C-spine was in good range of motion.  Shoulder joints,  elbow joints, wrist joints, MCPs.  Good range of motion.  She has synovial thickening over MCP joints but no synovitis was noted.  PIP and DIP prominence was noted.  Hip joints, knee joints, ankles were in good range of motion.  She had no tenderness over ankles or MTPs.  CDAI Exam: CDAI Score: 0  Patient Global: 0 mm; Provider Global: 0 mm Swollen: 0 ; Tender: 0  Joint Exam 08/01/2020   No joint exam has been documented for this visit   There is currently no information documented on the homunculus. Go to the Rheumatology activity and complete the homunculus joint exam.  Investigation: No additional findings.  Imaging: No results found.  Recent Labs: Lab Results  Component Value Date   WBC 6.3 06/22/2020   HGB 12.8 06/22/2020   PLT 304 06/22/2020   NA 141 06/22/2020  K 4.2 06/22/2020   CL 103 06/22/2020   CO2 33 (H) 06/22/2020   GLUCOSE 75 06/22/2020   BUN 16 06/22/2020   CREATININE 0.95 (H) 06/22/2020   BILITOT 0.4 06/22/2020   ALKPHOS 77 08/04/2019   AST 21 06/22/2020   ALT 9 06/22/2020   PROT 6.5 06/22/2020   ALBUMIN 4.5 08/04/2019   CALCIUM 9.8 06/22/2020   GFRAA 70 06/22/2020    Speciality Comments: PLQ Eye exam: 04/17/2020 WNL @ [facility]. Follow up in 1 year.  Procedures:  No procedures performed Allergies: Levaquin [levofloxacin] and Cephalexin   Assessment / Plan:     Visit Diagnoses: Rheumatoid arthritis involving multiple joints (HCC)-patient had no synovitis on examination.  She has synovial thickening.  She has been tolerating Arava and Plaquenil combination well.  She states since she had it Plaquenil she has not had any intermittent flares.  High risk medication use - Arava 10 mg daily, PLQ 200 mg BID M-F. PLQ Eye exam: 04/17/2020.  She will get eye examination this year.  Her labs are due in March and then every 3 months to monitor for drug toxicity.  Primary osteoarthritis of both hands-joint protection muscle strengthening was  discussed.  A handout on hand exercises was given.  Primary osteoarthritis of both knees-she has been walking on daily basis.  She does not have much discomfort currently.  Primary osteoarthritis of both feet-proper fitting shoes were discussed.  Osteopenia of multiple sites - Fosamax 70 mg 1 tablet by mouth once weekly.  DEXA 02/11/2018 T score -2.4 BMD 0.449 left forearm.  She will get DEXA scan with her GYN.  Other medical problems listed as follows:ty  History of pleural effusion  History of fracture of right hip  History of hyperlipidemia  Paroxysmal atrial fibrillation (HCC)  History of bronchiectasis  Orders: No orders of the defined types were placed in this encounter.  No orders of the defined types were placed in this encounter.    Follow-Up Instructions: Return in about 5 months (around 12/30/2020) for Rheumatoid arthritis, Osteoarthritis.   Pollyann Savoy, MD  Note - This record has been created using Animal nutritionist.  Chart creation errors have been sought, but may not always  have been located. Such creation errors do not reflect on  the standard of medical care.

## 2020-08-01 ENCOUNTER — Ambulatory Visit: Payer: PPO | Admitting: Rheumatology

## 2020-08-01 ENCOUNTER — Other Ambulatory Visit: Payer: Self-pay

## 2020-08-01 ENCOUNTER — Encounter: Payer: Self-pay | Admitting: Rheumatology

## 2020-08-01 VITALS — BP 123/69 | HR 76 | Resp 13 | Ht 66.5 in | Wt 135.6 lb

## 2020-08-01 DIAGNOSIS — M170 Bilateral primary osteoarthritis of knee: Secondary | ICD-10-CM | POA: Diagnosis not present

## 2020-08-01 DIAGNOSIS — Z8781 Personal history of (healed) traumatic fracture: Secondary | ICD-10-CM | POA: Diagnosis not present

## 2020-08-01 DIAGNOSIS — M069 Rheumatoid arthritis, unspecified: Secondary | ICD-10-CM | POA: Diagnosis not present

## 2020-08-01 DIAGNOSIS — M19041 Primary osteoarthritis, right hand: Secondary | ICD-10-CM | POA: Diagnosis not present

## 2020-08-01 DIAGNOSIS — M19042 Primary osteoarthritis, left hand: Secondary | ICD-10-CM

## 2020-08-01 DIAGNOSIS — M19072 Primary osteoarthritis, left ankle and foot: Secondary | ICD-10-CM | POA: Diagnosis not present

## 2020-08-01 DIAGNOSIS — I480 Paroxysmal atrial fibrillation: Secondary | ICD-10-CM

## 2020-08-01 DIAGNOSIS — Z8709 Personal history of other diseases of the respiratory system: Secondary | ICD-10-CM | POA: Diagnosis not present

## 2020-08-01 DIAGNOSIS — M8589 Other specified disorders of bone density and structure, multiple sites: Secondary | ICD-10-CM | POA: Diagnosis not present

## 2020-08-01 DIAGNOSIS — Z79899 Other long term (current) drug therapy: Secondary | ICD-10-CM | POA: Diagnosis not present

## 2020-08-01 DIAGNOSIS — M19071 Primary osteoarthritis, right ankle and foot: Secondary | ICD-10-CM | POA: Diagnosis not present

## 2020-08-01 DIAGNOSIS — Z8639 Personal history of other endocrine, nutritional and metabolic disease: Secondary | ICD-10-CM | POA: Diagnosis not present

## 2020-08-01 NOTE — Patient Instructions (Addendum)
Hand Exercises Hand exercises can be helpful for almost anyone. These exercises can strengthen the hands, improve flexibility and movement, and increase blood flow to the hands. These results can make work and daily tasks easier. Hand exercises can be especially helpful for people who have joint pain from arthritis or have nerve damage from overuse (carpal tunnel syndrome). These exercises can also help people who have injured a hand. Exercises Most of these hand exercises are gentle stretching and motion exercises. It is usually safe to do them often throughout the day. Warming up your hands before exercise may help to reduce stiffness. You can do this with gentle massage or by placing your hands in warm water for 10-15 minutes. It is normal to feel some stretching, pulling, tightness, or mild discomfort as you begin new exercises. This will gradually improve. Stop an exercise right away if you feel sudden, severe pain or your pain gets worse. Ask your health care provider which exercises are best for you. Knuckle bend or "claw" fist 1. Stand or sit with your arm, hand, and all five fingers pointed straight up. Make sure to keep your wrist straight during the exercise. 2. Gently bend your fingers down toward your palm until the tips of your fingers are touching the top of your palm. Keep your big knuckle straight and just bend the small knuckles in your fingers. 3. Hold this position for __________ seconds. 4. Straighten (extend) your fingers back to the starting position. Repeat this exercise 5-10 times with each hand. Full finger fist 1. Stand or sit with your arm, hand, and all five fingers pointed straight up. Make sure to keep your wrist straight during the exercise. 2. Gently bend your fingers into your palm until the tips of your fingers are touching the middle of your palm. 3. Hold this position for __________ seconds. 4. Extend your fingers back to the starting position, stretching every  joint fully. Repeat this exercise 5-10 times with each hand. Straight fist 1. Stand or sit with your arm, hand, and all five fingers pointed straight up. Make sure to keep your wrist straight during the exercise. 2. Gently bend your fingers at the big knuckle, where your fingers meet your hand, and the middle knuckle. Keep the knuckle at the tips of your fingers straight and try to touch the bottom of your palm. 3. Hold this position for __________ seconds. 4. Extend your fingers back to the starting position, stretching every joint fully. Repeat this exercise 5-10 times with each hand. Tabletop 1. Stand or sit with your arm, hand, and all five fingers pointed straight up. Make sure to keep your wrist straight during the exercise. 2. Gently bend your fingers at the big knuckle, where your fingers meet your hand, as far down as you can while keeping the small knuckles in your fingers straight. Think of forming a tabletop with your fingers. 3. Hold this position for __________ seconds. 4. Extend your fingers back to the starting position, stretching every joint fully. Repeat this exercise 5-10 times with each hand. Finger spread 1. Place your hand flat on a table with your palm facing down. Make sure your wrist stays straight as you do this exercise. 2. Spread your fingers and thumb apart from each other as far as you can until you feel a gentle stretch. Hold this position for __________ seconds. 3. Bring your fingers and thumb tight together again. Hold this position for __________ seconds. Repeat this exercise 5-10 times with each hand.  Making circles 1. Stand or sit with your arm, hand, and all five fingers pointed straight up. Make sure to keep your wrist straight during the exercise. 2. Make a circle by touching the tip of your thumb to the tip of your index finger. 3. Hold for __________ seconds. Then open your hand wide. 4. Repeat this motion with your thumb and each finger on your  hand. Repeat this exercise 5-10 times with each hand. Thumb motion 1. Sit with your forearm resting on a table and your wrist straight. Your thumb should be facing up toward the ceiling. Keep your fingers relaxed as you move your thumb. 2. Lift your thumb up as high as you can toward the ceiling. Hold for __________ seconds. 3. Bend your thumb across your palm as far as you can, reaching the tip of your thumb for the small finger (pinkie) side of your palm. Hold for __________ seconds. Repeat this exercise 5-10 times with each hand. Grip strengthening 1. Hold a stress ball or other soft ball in the middle of your hand. 2. Slowly increase the pressure, squeezing the ball as much as you can without causing pain. Think of bringing the tips of your fingers into the middle of your palm. All of your finger joints should bend when doing this exercise. 3. Hold your squeeze for __________ seconds, then relax. Repeat this exercise 5-10 times with each hand.   Contact a health care provider if:  Your hand pain or discomfort gets much worse when you do an exercise.  Your hand pain or discomfort does not improve within 2 hours after you exercise. If you have any of these problems, stop doing these exercises right away. Do not do them again unless your health care provider says that you can. Get help right away if:  You develop sudden, severe hand pain or swelling. If this happens, stop doing these exercises right away. Do not do them again unless your health care provider says that you can. This information is not intended to replace advice given to you by your health care provider. Make sure you discuss any questions you have with your health care provider. Document Revised: 10/21/2018 Document Reviewed: 07/01/2018 Elsevier Patient Education  2021 [place] We placed an order today for your standing lab work.   Please have your standing labs drawn in March and  every 3  months  If possible, please have your labs drawn 2 weeks prior to your appointment so that the provider can discuss your results at your appointment.  We have open lab daily Monday through Thursday from 8:30-12:30 PM and 1:30-4:30 PM and Friday from 8:30-12:30 PM and 1:30-4:00 PM at the office of Dr. Bo Merino, [facility].   Please be advised, patients with office appointments requiring lab work will take precedents over walk-in lab work.  If possible, please come for your lab work on Monday and Friday afternoons, as you may experience shorter wait times. The office is located at [address], [place], [place], [place] [address] No appointment is necessary.   Labs are drawn by [facility]. Please bring your co-pay at the time of your lab draw.  You may receive a bill from [facility] for your lab work.  If you wish to have your labs drawn at another location, please call the office 24 hours in advance to send orders.  If you have any questions regarding directions or hours of operation,  please call [phone].   As  a reminder, please drink plenty of water prior to coming for your lab work. Thanks!

## 2020-08-09 ENCOUNTER — Telehealth: Payer: Self-pay | Admitting: *Deleted

## 2020-08-09 NOTE — Telephone Encounter (Signed)
Patient called requesting bone density order, I explained to patient she will need to schedule annual exam. Patient reports Dr.Fontaine told her she no longer needs GYN annual exams (this is not documented in the chart) I explained she will need to have a GYN exam before dexa can be ordered. Patient verbalized she understood, will send a message to appointments to schedule.

## 2020-08-13 NOTE — Progress Notes (Signed)
70 y.o. [id number] Divorced White or Caucasian Not Hispanic or Latino female here for a breast and pelvic exam.  No vaginal bleeding. No bowel or bladder issues.  She needs a bone density.  She is on Fosamax, primary is giving it to her.     Patient's last menstrual period was 07/14/1993.          Sexually active: No.  The current method of family planning is post menopausal status.    Exercising: Yes.    walking and gardening Smoker:  no  Health Maintenance: Pap:  02/01/18 Neg History of abnormal Pap:  no MMG:  08/17/19 BIRADS 1 negative/density b BMD:   02/08/18 Osteopenia, on Fosamax.  Colonoscopy: 11/04/16 f/u 10 years TDaP:  UTD per patient  Gardasil: n/a   reports that she has never smoked. She has never used smokeless tobacco. She reports that she does not drink alcohol and does not use drugs. 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 2 grown kids and 3 grand kids (2, 3.5 and 5).  2 dogs.   Past Medical History:  Diagnosis Date  . Elevated cholesterol   . Migraines    occular  . Osteoporosis 2015   based on hip fracture from fall  DEXA 01/2018 T score -2.3  . Pleural effusion 9/206   Bilateral effusions, s/p thora on R c/w exudate  . Pneumonia 03/2015   R  . RA (rheumatoid arthritis) ([place])   . Skin cancer (melanoma) (HCC)    Right Arm, nose.     Past Surgical History:  Procedure Laterality Date  . COLONOSCOPY WITH PROPOFOL N/A 11/04/2016   Procedure: COLONOSCOPY WITH PROPOFOL;  Surgeon: Garlan Fair, MD;  Location: [facility];  Service: Endoscopy;  Laterality: N/A;  . FINGER SURGERY    . HIP ARTHROPLASTY Right 11/14/2013   Procedure: RIGHT CANNULATED HIP PINNING;  Surgeon: Alta Corning, MD;  Location: [facility];  Service: Orthopedics;  Laterality: Right;  BIOMET  . SQUAMOUS CELL CARCINOMA EXCISION  2021   behind knee   . VIDEO BRONCHOSCOPY Bilateral 04/05/2015   Procedure: VIDEO BRONCHOSCOPY WITHOUT FLUORO;  Surgeon: Juanito Doom, MD;  Location: [facility];  Service: Cardiopulmonary;  Laterality: Bilateral;    Current Outpatient Medications  Medication Sig Dispense Refill  . alendronate (FOSAMAX) 70 MG tablet Take 70 mg by mouth once a week. Take with a full glass of water on an empty stomach.    . Calcium Carbonate (CALTRATE 600 PO) Take by mouth daily.    . hydroxychloroquine (PLAQUENIL) 200 MG tablet TAKE 1 TABLET BY MOUTH TWICE DAILY MONDAY THROUGH FRIDAY 180 tablet 0  . leflunomide (ARAVA) 10 MG tablet TAKE 1 TABLET(10 MG) BY MOUTH DAILY 90 tablet 0  . simvastatin (ZOCOR) 40 MG tablet Take 40 mg by mouth daily.    . Turmeric 400 MG CAPS Take 1 capsule by mouth daily.     No current facility-administered medications for this visit.    Family History  Problem Relation Age of Onset  . Heart attack Father 40  . Heart disease Father   . Osteoporosis Sister   . High Cholesterol Sister   . Melanoma Daughter   . Melanoma Daughter     Review of Systems  Constitutional: Negative.   HENT: Negative.   Eyes: Negative.   Respiratory: Negative.   Cardiovascular: Negative.   Gastrointestinal: Negative.   Endocrine: Negative.   Genitourinary: Negative.   Musculoskeletal: Negative.   Skin:  Negative.   Allergic/Immunologic: Negative.   Neurological: Negative.   Hematological: Negative.   Psychiatric/Behavioral: Negative.     Exam:   BP 110/68 (BP Location: Left Arm, Patient Position: Sitting, Cuff Size: Normal)   Pulse 88   Ht 5\' 6"  (1.676 m)   Wt 132 lb (59.9 kg)   LMP 07/14/1993   BMI 21.31 kg/m   Weight change: @WEIGHTCHANGE @ Height:   Height: 5\' 6"  (167.6 cm)  Ht Readings from Last 3 Encounters:  08/14/20 5\' 6"  (1.676 m)  08/01/20 5' 6.5" (1.689 m)  02/29/20 5' 6.5" (1.689 m)    General appearance: alert, cooperative and appears stated age Head: Normocephalic, without obvious abnormality, atraumatic Neck: no adenopathy, supple, symmetrical, trachea midline and thyroid normal to  inspection and palpation Breasts: normal appearance, no masses or tenderness Abdomen: soft, non-tender; non distended,  no masses,  no organomegaly Extremities: extremities normal, atraumatic, no cyanosis or edema Skin: Skin color, texture, turgor normal. No rashes or lesions Lymph nodes: Cervical, supraclavicular, and axillary nodes normal. No abnormal inguinal nodes palpated Neurologic: Grossly normal   Pelvic: External genitalia:  no lesions              Urethra:  normal appearing urethra with no masses, tenderness or lesions              Bartholins and Skenes: normal                 Vagina: normal appearing vagina with normal color and discharge, no lesions              Cervix: no lesions               Bimanual Exam:  Uterus:  normal size, contour, position, consistency, mobility, non-tender              Adnexa: no mass, fullness, tenderness               Rectovaginal: Confirms               Anus:  normal sphincter tone, no lesions  Terence Lux chaperoned for the exam.  1. Encounter for gynecological examination without abnormal finding Discussed breast self exam Mammogram due  Colonoscopy UTD  2. History of osteopenia On calcium and vit D Exercising On Fosamax - DG Bone Density; Future

## 2020-08-14 ENCOUNTER — Other Ambulatory Visit: Payer: Self-pay

## 2020-08-14 ENCOUNTER — Ambulatory Visit (INDEPENDENT_AMBULATORY_CARE_PROVIDER_SITE_OTHER): Payer: PPO | Admitting: Obstetrics and Gynecology

## 2020-08-14 ENCOUNTER — Encounter: Payer: Self-pay | Admitting: Obstetrics and Gynecology

## 2020-08-14 VITALS — BP 110/68 | HR 88 | Ht 66.0 in | Wt 132.0 lb

## 2020-08-14 DIAGNOSIS — Z8739 Personal history of other diseases of the musculoskeletal system and connective tissue: Secondary | ICD-10-CM

## 2020-08-14 DIAGNOSIS — Z01419 Encounter for gynecological examination (general) (routine) without abnormal findings: Secondary | ICD-10-CM

## 2020-08-14 NOTE — Patient Instructions (Signed)

## 2020-08-16 ENCOUNTER — Other Ambulatory Visit: Payer: Self-pay | Admitting: *Deleted

## 2020-08-16 DIAGNOSIS — M069 Rheumatoid arthritis, unspecified: Secondary | ICD-10-CM

## 2020-08-16 MED ORDER — HYDROXYCHLOROQUINE SULFATE 200 MG PO TABS: ORAL_TABLET | ORAL | 0 refills | Status: DC

## 2020-08-16 NOTE — Telephone Encounter (Signed)
RX faxed from [facility].  Last Visit: 08/01/2020 Next Visit: 01/02/2021 Labs: 06/22/2020, Creatinine is borderline elevated-0.95. GFR is WNL. Please clarify if the patient may have been dehydrated or if she has taken any NSAIDs recently. Co2 borderline elevated but has trended down. Rest of CMP WNL. CBC WNL. Eye exam: 04/17/2020 WNL  Current Dose per office note 08/01/2020, PLQ 200 mg BID M-F DX: Rheumatoid arthritis involving multiple joints   Okay to refill Plaquenil?

## 2020-09-11 DIAGNOSIS — Z1231 Encounter for screening mammogram for malignant neoplasm of breast: Secondary | ICD-10-CM | POA: Diagnosis not present

## 2020-09-18 ENCOUNTER — Other Ambulatory Visit: Payer: Self-pay | Admitting: Rheumatology

## 2020-09-18 NOTE — Telephone Encounter (Signed)
Last Visit: 08/01/2020 Next Visit: 01/02/2021 Labs: 06/22/2020, Creatinine is borderline elevated-0.95. GFR is WNL. Please clarify if the patient may have been dehydrated or if she has taken any NSAIDs recently. Co2 borderline elevated but has trended down. Rest of CMP WNL. CBC WNL.   LMOM to have labs drawn.  Current Dose per office note 08/01/2020, Arava 10 mg daily DX: Rheumatoid arthritis involving multiple joints   Last Fill: 06/20/2020  Okay to refill ARAVA?

## 2020-09-25 ENCOUNTER — Other Ambulatory Visit: Payer: Self-pay

## 2020-09-25 DIAGNOSIS — Z79899 Other long term (current) drug therapy: Secondary | ICD-10-CM | POA: Diagnosis not present

## 2020-09-26 LAB — CBC WITH DIFFERENTIAL/PLATELET
Absolute Monocytes: 572 cells/uL (ref 200–950)
Basophils Absolute: 69 cells/uL (ref 0–200)
Basophils Relative: 1.3 %
Eosinophils Absolute: 239 cells/uL (ref 15–500)
Eosinophils Relative: 4.5 %
HCT: 40.1 % (ref 35.0–45.0)
Hemoglobin: 13 g/dL (ref 11.7–15.5)
Lymphs Abs: 1240 cells/uL (ref 850–3900)
MCH: 31.3 pg (ref 27.0–33.0)
MCHC: 32.4 g/dL (ref 32.0–36.0)
MCV: 96.6 fL (ref 80.0–100.0)
MPV: 10.3 fL (ref 7.5–12.5)
Monocytes Relative: 10.8 %
Neutro Abs: 3180 cells/uL (ref 1500–7800)
Neutrophils Relative %: 60 %
Platelets: 297 10*3/uL (ref 140–400)
RBC: 4.15 10*6/uL (ref 3.80–5.10)
RDW: 11.6 % (ref 11.0–15.0)
Total Lymphocyte: 23.4 %
WBC: 5.3 10*3/uL (ref 3.8–10.8)

## 2020-09-26 LAB — COMPLETE METABOLIC PANEL WITH GFR
AG Ratio: 1.8 (calc) (ref 1.0–2.5)
ALT: 9 U/L (ref 6–29)
AST: 20 U/L (ref 10–35)
Albumin: 4.2 g/dL (ref 3.6–5.1)
Alkaline phosphatase (APISO): 69 U/L (ref 37–153)
BUN: 17 mg/dL (ref 7–25)
CO2: 34 mmol/L — ABNORMAL HIGH (ref 20–32)
Calcium: 10.1 mg/dL (ref 8.6–10.4)
Chloride: 102 mmol/L (ref 98–110)
Creat: 0.73 mg/dL (ref 0.60–0.93)
GFR, Est African American: 96 mL/min/{1.73_m2} (ref >=60)
GFR, Est Non African American: 83 mL/min/{1.73_m2} (ref >=60)
Globulin: 2.3 g/dL (calc) (ref 1.9–3.7)
Glucose, Bld: 71 mg/dL (ref 65–99)
Potassium: 4.5 mmol/L (ref 3.5–5.3)
Sodium: 141 mmol/L (ref 135–146)
Total Bilirubin: 0.5 mg/dL (ref 0.2–1.2)
Total Protein: 6.5 g/dL (ref 6.1–8.1)

## 2020-09-26 NOTE — Progress Notes (Signed)
CBC and CMP are normal.

## 2020-10-12 ENCOUNTER — Other Ambulatory Visit: Payer: Self-pay | Admitting: *Deleted

## 2020-10-12 DIAGNOSIS — Z8739 Personal history of other diseases of the musculoskeletal system and connective tissue: Secondary | ICD-10-CM

## 2020-10-15 DIAGNOSIS — E785 Hyperlipidemia, unspecified: Secondary | ICD-10-CM | POA: Diagnosis not present

## 2020-10-15 DIAGNOSIS — M810 Age-related osteoporosis without current pathological fracture: Secondary | ICD-10-CM | POA: Diagnosis not present

## 2020-10-16 ENCOUNTER — Other Ambulatory Visit: Payer: Self-pay | Admitting: Obstetrics and Gynecology

## 2020-10-16 ENCOUNTER — Other Ambulatory Visit: Payer: Self-pay

## 2020-10-16 ENCOUNTER — Ambulatory Visit (INDEPENDENT_AMBULATORY_CARE_PROVIDER_SITE_OTHER): Payer: PPO

## 2020-10-16 DIAGNOSIS — M810 Age-related osteoporosis without current pathological fracture: Secondary | ICD-10-CM | POA: Diagnosis not present

## 2020-10-16 DIAGNOSIS — Z780 Asymptomatic menopausal state: Secondary | ICD-10-CM | POA: Diagnosis not present

## 2020-10-16 DIAGNOSIS — Z8739 Personal history of other diseases of the musculoskeletal system and connective tissue: Secondary | ICD-10-CM

## 2020-11-09 ENCOUNTER — Telehealth: Payer: Self-pay | Admitting: Rheumatology

## 2020-11-09 ENCOUNTER — Other Ambulatory Visit: Payer: Self-pay | Admitting: Physician Assistant

## 2020-11-09 DIAGNOSIS — M069 Rheumatoid arthritis, unspecified: Secondary | ICD-10-CM

## 2020-11-09 NOTE — Telephone Encounter (Signed)
Patient requesting refill on Generic Plaquenil sent to [facility]. Patient confused as to why she is having to request refill now. Patient did state she only received 6 week supply last pick up.

## 2020-11-09 NOTE — Telephone Encounter (Signed)
Last Visit: 08/01/2020 Next Visit: 01/02/2021 Labs: 09/25/2020, CBC and CMP are normal. Eye exam: 04/17/2020  Current Dose per office note 08/01/2020, PLQ 200 mg BID M-F  DX: Rheumatoid arthritis involving multiple joints   Last Fill: 08/16/2020  Okay to refill Plaquenil?

## 2020-11-12 NOTE — Telephone Encounter (Signed)
I called patient, RX was sent on 11/10/2020 # [phone]

## 2020-11-13 DIAGNOSIS — M069 Rheumatoid arthritis, unspecified: Secondary | ICD-10-CM | POA: Diagnosis not present

## 2020-11-13 DIAGNOSIS — Z1331 Encounter for screening for depression: Secondary | ICD-10-CM | POA: Diagnosis not present

## 2020-11-13 DIAGNOSIS — Z0000 Encounter for general adult medical examination without abnormal findings: Secondary | ICD-10-CM | POA: Diagnosis not present

## 2020-11-13 DIAGNOSIS — M810 Age-related osteoporosis without current pathological fracture: Secondary | ICD-10-CM | POA: Diagnosis not present

## 2020-11-13 DIAGNOSIS — M545 Low back pain, unspecified: Secondary | ICD-10-CM | POA: Diagnosis not present

## 2020-11-13 DIAGNOSIS — E785 Hyperlipidemia, unspecified: Secondary | ICD-10-CM | POA: Diagnosis not present

## 2020-11-13 DIAGNOSIS — M0510 Rheumatoid lung disease with rheumatoid arthritis of unspecified site: Secondary | ICD-10-CM | POA: Diagnosis not present

## 2020-11-13 DIAGNOSIS — Z1389 Encounter for screening for other disorder: Secondary | ICD-10-CM | POA: Diagnosis not present

## 2020-11-26 ENCOUNTER — Encounter (HOSPITAL_COMMUNITY): Payer: PPO

## 2020-11-26 ENCOUNTER — Encounter: Payer: Self-pay | Admitting: Family Medicine

## 2020-11-26 ENCOUNTER — Ambulatory Visit: Payer: PPO | Admitting: Family Medicine

## 2020-11-26 ENCOUNTER — Other Ambulatory Visit: Payer: Self-pay

## 2020-11-26 VITALS — BP 116/64 | Ht 66.5 in | Wt 132.0 lb

## 2020-11-26 DIAGNOSIS — S39012A Strain of muscle, fascia and tendon of lower back, initial encounter: Secondary | ICD-10-CM | POA: Diagnosis not present

## 2020-11-26 NOTE — Patient Instructions (Signed)
You have a lumbar strain. Ok to take tylenol for baseline pain relief (1-2 extra strength tabs 3x/day) Take aleve with food for pain and inflammation if needed. Stay as active as possible. Do home exercises and stretches as directed - hold each for 20-30 seconds and do each one three times. Start physical therapy. Heating pad 15 minutes at a time as needed. Strengthening of low back muscles, abdominal musculature are key for long term pain relief. Follow up with me in 1 month to 6 weeks for reevaluation.Marland Kitchen

## 2020-11-26 NOTE — Progress Notes (Signed)
   SUBJECTIVE:  CHIEF COMPLAINT / HPI:   Low Back Pain  Patient presenting with low back pain x 6 to 7 weeks.  Back pain is located in bilateral lower back.  Patient reports the sensation is not as much pain as it feels like a strain or aggravation.  Patient reports that she was having daily discomfort for about 3 weeks when this started, stopped for 5 days and then started again after Easter.  The strain in general comes and goes.  She reports it being worse when on her feet and goes away with heating pad or back.  The episodes of discomfort last for a couple minutes to a few hours and go away on their own.  She does report that her symptoms are all over the place and is apologetic that it seems vague.  She reports this is the reason it took her so long to come in.  She denies any sciatic type pain, traumas.  No bowel or bladder incontinence, paresthesias, lower extremity weakness.  She denies any hip pain.  Current Outpatient Medications  Medication Instructions  . alendronate (FOSAMAX) 70 mg, Oral, Weekly, Take with a full glass of water on an empty stomach.   . Calcium Carbonate (CALTRATE 600 PO) Oral, Daily  . hydroxychloroquine (PLAQUENIL) 200 MG tablet TAKE 1 TABLET BY MOUTH TWICE DAILY MONDAY THROUGH FRIDAY  . leflunomide (ARAVA) 10 MG tablet TAKE 1 TABLET(10 MG) BY MOUTH DAILY  . simvastatin (ZOCOR) 40 mg, Oral, Daily  . Turmeric 400 MG CAPS 1 capsule, Oral, Daily    PERTINENT  PMH / PSH: RA, OA, osteoporosis   OBJECTIVE:  BP 116/64   Ht 5' 6.5" (1.689 m)   Wt 132 lb (59.9 kg)   LMP 07/14/1993   BMI 20.99 kg/m   General: well appearing, NAD  Lower back: No asymmetry or gross abnormality on inspection.  Patient has tenderness to palpation of lateral paraspinal lumbar area bilaterally.  She has no tenderness to palpation of midline spinous processes of cervical, thoracic, lumbar region.  She has mild limited range of movement with lateral flexion, but symmetric.  Limited extension,  but no pain.  Full range of movement with flexion (which she reports improves her pain).  Negative leg raise bilaterally.  Pain is elicited during physical exam when patient goes from supine to sitting position.  It is relieved with no intervention after a couple of minutes. Hips: No gross abnormality on inspection.  No tenderness to palpation of greater trochanter or posterior iliac crest.  Full range of motion.  5 out of 5 strength.  ASSESSMENT/PLAN:  Lumbar strain Patient does not have any midline pain or radicular symptoms.  Given her age and PMHx, initial concern would be for osteoporotic fracture, however, patient does not have any physical exam findings or history that is consistent with this.  Her exam overall is reassuring.  She has tenderness to palpation of lower back musculature and has improvement of pain with stretching exercises.  There is no indication for imaging at this time.  Patient would likely benefit from physical therapy for strengthening and flexibility.  Follow-up in 4 to 6 weeks   Wilber Oliphant, MD

## 2020-12-17 ENCOUNTER — Other Ambulatory Visit: Payer: Self-pay | Admitting: Physician Assistant

## 2020-12-17 NOTE — Telephone Encounter (Signed)
Last Visit: 08/01/2020 Next Visit: 01/02/2021 Labs: 09/25/2020, CBC and CMP are normal.  Current Dose per office note 08/01/2020, Arava 10 mg daily  DX: Rheumatoid arthritis involving multiple joints   Last Fill: 09/18/2020  Okay to refill per Dr. Estanislado Pandy

## 2020-12-18 DIAGNOSIS — L821 Other seborrheic keratosis: Secondary | ICD-10-CM | POA: Diagnosis not present

## 2020-12-18 DIAGNOSIS — D225 Melanocytic nevi of trunk: Secondary | ICD-10-CM | POA: Diagnosis not present

## 2020-12-18 DIAGNOSIS — Z85820 Personal history of malignant melanoma of skin: Secondary | ICD-10-CM | POA: Diagnosis not present

## 2020-12-18 DIAGNOSIS — L570 Actinic keratosis: Secondary | ICD-10-CM | POA: Diagnosis not present

## 2020-12-18 DIAGNOSIS — L578 Other skin changes due to chronic exposure to nonionizing radiation: Secondary | ICD-10-CM | POA: Diagnosis not present

## 2020-12-18 DIAGNOSIS — Z85828 Personal history of other malignant neoplasm of skin: Secondary | ICD-10-CM | POA: Diagnosis not present

## 2020-12-18 DIAGNOSIS — L814 Other melanin hyperpigmentation: Secondary | ICD-10-CM | POA: Diagnosis not present

## 2020-12-19 NOTE — Progress Notes (Signed)
Office Visit Note  Patient: [name] [name]             Date of Birth: [birth year]           MRN: [id number]             PCP: Michael Boston, MD Referring: Levin Erp, MD Visit Date: 01/02/2021 Occupation: @GUAROCC @  Subjective:  Medication management.   History of Present Illness: [name] [name] is a 70 y.o. female with a history of rheumatoid arthritis and osteoarthritis.  [name] [name] denies any joint pain or joint swelling.  [name] [name] states [name] [name] has been tolerating leflunomide and Plaquenil without any side effects.  [name] [name] is very active and does gardening on a regular basis.  Activities of Daily Living:  Patient reports morning stiffness for 0 minutes.   Patient Denies nocturnal pain.  Difficulty dressing/grooming: Denies Difficulty climbing stairs: Denies Difficulty getting out of chair: Denies Difficulty using hands for taps, buttons, cutlery, and/or writing: Denies  Review of Systems  Constitutional:  Negative for fatigue.  HENT:  Negative for mouth sores, mouth dryness and nose dryness.   Eyes:  Negative for pain, itching, visual disturbance and dryness.  Respiratory:  Negative for cough, hemoptysis, shortness of breath and difficulty breathing.   Cardiovascular:  Negative for chest pain, palpitations and swelling in legs/feet.  Gastrointestinal:  Negative for abdominal pain, blood in stool, constipation and diarrhea.  Endocrine: Negative for increased urination.  Genitourinary:  Negative for painful urination.  Musculoskeletal:  Negative for joint pain, joint pain, joint swelling, myalgias, muscle weakness, morning stiffness, muscle tenderness and myalgias.  Skin:  Negative for color change, rash and redness.  Allergic/Immunologic: Negative for susceptible to infections.  Neurological:  Negative for dizziness, numbness, headaches, memory loss and weakness.  Hematological:  Negative for swollen glands.  Psychiatric/Behavioral:  Negative for confusion and sleep disturbance.    PMFS History:   Patient Active Problem List   Diagnosis Date Noted   Primary osteoarthritis of both knees 01/08/2017   Primary osteoarthritis of both feet 01/08/2017   History of pleural effusion 12/01/2016   History of recurrent pneumonia 12/01/2016   History of hyperlipidemia 12/01/2016   High risk medication use 12/01/2016   Trigger thumb, left thumb 12/01/2016   Elevated sed rate 12/01/2016   History of fracture of right hip 12/01/2016   Primary osteoarthritis of both hands 12/01/2016   Rheumatoid arthritis involving multiple joints ([place]) 04/09/2015   HCAP (healthcare-associated pneumonia)    Paroxysmal atrial fibrillation ([place])    Tendonitis 04/03/2015   Pleural effusion associated with pulmonary infection 03/27/2015   Orthostatic syncope    Syncope 03/18/2015   Osteoporosis    Hyperlipidemia 11/14/2013    Past Medical History:  Diagnosis Date   Elevated cholesterol    Migraines    occular   Osteoporosis 2015   based on hip fracture from fall  DEXA 01/2018 T score -2.3   Pleural effusion 9/206   Bilateral effusions, s/p thora on R c/w exudate   Pneumonia 03/2015   R   RA (rheumatoid arthritis) ([place])    Skin cancer (melanoma) (HCC)    Right Arm, nose.     Family History  Problem Relation Age of Onset   Heart attack Father 46   Heart disease Father    Osteoporosis Sister    High Cholesterol Sister    Melanoma Daughter    Melanoma Daughter    Past Surgical History:  Procedure Laterality Date   COLONOSCOPY WITH PROPOFOL N/A 11/04/2016  Procedure: COLONOSCOPY WITH PROPOFOL;  Surgeon: Garlan Fair, MD;  Location: [facility];  Service: Endoscopy;  Laterality: N/A;   FINGER SURGERY     HIP ARTHROPLASTY Right 11/14/2013   Procedure: RIGHT CANNULATED HIP PINNING;  Surgeon: Alta Corning, MD;  Location: [facility];  Service: Orthopedics;  Laterality: Right;  BIOMET   SQUAMOUS CELL CARCINOMA EXCISION  2021   behind knee    VIDEO BRONCHOSCOPY Bilateral 04/05/2015   Procedure: VIDEO  BRONCHOSCOPY WITHOUT FLUORO;  Surgeon: Juanito Doom, MD;  Location: [facility];  Service: Cardiopulmonary;  Laterality: Bilateral;   Social History   Social History Narrative   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  Constitutional:      Appearance: [name] [name] is well-developed.  HENT:     Head: Normocephalic and atraumatic.  Eyes:     Conjunctiva/sclera: Conjunctivae normal.  Cardiovascular:     Rate and Rhythm: Normal rate and regular rhythm.     Heart sounds: Normal heart sounds.  Pulmonary:     Effort: Pulmonary effort is normal.     Breath sounds: Normal breath sounds.  Abdominal:     General: Bowel sounds are normal.     Palpations: Abdomen is soft.  Musculoskeletal:     Cervical back: Normal range of motion.  Lymphadenopathy:     Cervical: No cervical adenopathy.  Skin:    General: Skin is warm and dry.     Capillary Refill: Capillary refill takes less than 2 seconds.  Neurological:     Mental Status: [name] [name] is alert and oriented to person, place, and time.  Psychiatric:        Behavior: Behavior normal.     Musculoskeletal Exam: C-spine was in good range of motion.  [name] [name] has some thoracic kyphosis.  [name] [name] had good range of motion of the lumbar spine.  Shoulder joints, elbow joints, wrist joints, MCPs PIPs and DIPs with good range of motion with no synovitis.  Hip joints, knee joints, ankles, MTPs and PIPs with good range of motion with no synovitis.  CDAI Exam: CDAI Score: 0   Patient Global: 0 mm; Provider Global: 0 mm Swollen: 0 ; Tender: 0  Joint Exam 01/02/2021   No joint exam has been documented for this visit   There is currently no information documented on the homunculus. Go to the Rheumatology activity and complete the homunculus joint exam.  Investigation: No additional findings.  Imaging: No results found.  Recent Labs: Lab Results  Component Value Date   WBC 5.3 09/25/2020   HGB 13.0 09/25/2020   PLT 297 09/25/2020   NA 141 09/25/2020   K 4.5 09/25/2020   CL 102 09/25/2020   CO2 34 (H) 09/25/2020   GLUCOSE 71 09/25/2020   BUN 17 09/25/2020   CREATININE 0.73 09/25/2020   BILITOT 0.5 09/25/2020   ALKPHOS 77 08/04/2019   AST 20 09/25/2020   ALT 9 09/25/2020  PROT 6.5 09/25/2020   ALBUMIN 4.5 08/04/2019   CALCIUM 10.1 09/25/2020   GFRAA 96 09/25/2020    Speciality Comments: PLQ Eye exam: 04/17/2020 WNL @ [facility]. Follow up in 1 year. Arava started in 2017 by Dr. Lorraine Lax started 01/2017 Fosamax 2015-2018 offx 2years then resumed 2020  Procedures:  No procedures performed Allergies: Levaquin [levofloxacin] and Cephalexin   Assessment / Plan:     Visit Diagnoses: Rheumatoid arthritis involving multiple joints (HCC)-[name] [name] had no synovitis on my examination.  [name] [name] has been tolerating leflunomide and hydroxychloroquine combination well.  High risk medication use - Arava 10 mg daily, PLQ 200 mg BID M-F. PLQ Eye exam: 04/17/2020.  - Plan: CBC with Differential/Platelet, COMPLETE METABOLIC PANEL WITH GFR today and then every 3 months to monitor for drug toxicity.  Primary osteoarthritis of both hands-joint protection muscle strengthening was discussed.  Primary osteoarthritis of both knees-[name] [name] denies any discomfort.  Primary osteoarthritis of both feet-[name] [name] denies discomfort.  Osteoporosis of multiple sites - Fosamax 70 mg 1 tablet by mouth once weekly.  DEXA scan October 16, 2020 showed T score of -2.6.Marland Kitchen  I reviewed  her most recent DEXA scan which is consistent with osteoporosis.  [name] [name] had been on Fosamax for 3 years from 2015 till 2018.  [name] [name] was off Fosamax until 2020.  [name] [name] resumed Fosamax in 2020.  [name] [name] is uncertain about her compliance.  [name] [name] may benefit from Prolia.  [name] [name] is going to think about it and will discuss with her PCP.  History of fracture of right hip  Other medical problems are listed as follows:  Paroxysmal atrial fibrillation (HCC)  History of hyperlipidemia  History of pleural effusion  History of bronchiectasis  Orders: Orders Placed This Encounter  Procedures   CBC with Differential/Platelet   COMPLETE METABOLIC PANEL WITH GFR   No orders of the defined types were placed in this encounter.    Follow-Up Instructions: Return in about 5 months (around 06/04/2021) for Rheumatoid arthritis.   Bo Merino, MD  Note - This record has been created using Editor, commissioning.  Chart creation errors have been sought, but may not always  have been located. Such creation errors do not reflect on  the standard of medical care.

## 2021-01-02 ENCOUNTER — Encounter: Payer: Self-pay | Admitting: Rheumatology

## 2021-01-02 ENCOUNTER — Other Ambulatory Visit: Payer: Self-pay

## 2021-01-02 ENCOUNTER — Ambulatory Visit: Payer: PPO | Admitting: Rheumatology

## 2021-01-02 VITALS — BP 93/63 | HR 86 | Ht 66.5 in | Wt 132.8 lb

## 2021-01-02 DIAGNOSIS — M19072 Primary osteoarthritis, left ankle and foot: Secondary | ICD-10-CM

## 2021-01-02 DIAGNOSIS — M19041 Primary osteoarthritis, right hand: Secondary | ICD-10-CM

## 2021-01-02 DIAGNOSIS — Z8639 Personal history of other endocrine, nutritional and metabolic disease: Secondary | ICD-10-CM | POA: Diagnosis not present

## 2021-01-02 DIAGNOSIS — Z79899 Other long term (current) drug therapy: Secondary | ICD-10-CM | POA: Diagnosis not present

## 2021-01-02 DIAGNOSIS — M069 Rheumatoid arthritis, unspecified: Secondary | ICD-10-CM

## 2021-01-02 DIAGNOSIS — Z8781 Personal history of (healed) traumatic fracture: Secondary | ICD-10-CM

## 2021-01-02 DIAGNOSIS — M19071 Primary osteoarthritis, right ankle and foot: Secondary | ICD-10-CM

## 2021-01-02 DIAGNOSIS — Z8709 Personal history of other diseases of the respiratory system: Secondary | ICD-10-CM

## 2021-01-02 DIAGNOSIS — M19042 Primary osteoarthritis, left hand: Secondary | ICD-10-CM | POA: Diagnosis not present

## 2021-01-02 DIAGNOSIS — M170 Bilateral primary osteoarthritis of knee: Secondary | ICD-10-CM | POA: Diagnosis not present

## 2021-01-02 DIAGNOSIS — I480 Paroxysmal atrial fibrillation: Secondary | ICD-10-CM | POA: Diagnosis not present

## 2021-01-02 DIAGNOSIS — M810 Age-related osteoporosis without current pathological fracture: Secondary | ICD-10-CM

## 2021-01-02 LAB — COMPLETE METABOLIC PANEL WITH GFR
AG Ratio: 1.8 (calc) (ref 1.0–2.5)
ALT: 11 U/L (ref 6–29)
AST: 25 U/L (ref 10–35)
Albumin: 4.2 g/dL (ref 3.6–5.1)
Alkaline phosphatase (APISO): 66 U/L (ref 37–153)
BUN/Creatinine Ratio: 23 (calc) — ABNORMAL HIGH (ref 6–22)
BUN: 22 mg/dL (ref 7–25)
CO2: 29 mmol/L (ref 20–32)
Calcium: 9.7 mg/dL (ref 8.6–10.4)
Chloride: 102 mmol/L (ref 98–110)
Creat: 0.95 mg/dL — ABNORMAL HIGH (ref 0.60–0.93)
GFR, Est African American: 70 mL/min/{1.73_m2} (ref >=60)
GFR, Est Non African American: 60 mL/min/{1.73_m2} (ref >=60)
Globulin: 2.3 g/dL (calc) (ref 1.9–3.7)
Glucose, Bld: 93 mg/dL (ref 65–99)
Potassium: 4.2 mmol/L (ref 3.5–5.3)
Sodium: 140 mmol/L (ref 135–146)
Total Bilirubin: 0.6 mg/dL (ref 0.2–1.2)
Total Protein: 6.5 g/dL (ref 6.1–8.1)

## 2021-01-02 LAB — CBC WITH DIFFERENTIAL/PLATELET
Absolute Monocytes: 538 cells/uL (ref 200–950)
Basophils Absolute: 70 cells/uL (ref 0–200)
Basophils Relative: 0.9 %
Eosinophils Absolute: 156 cells/uL (ref 15–500)
Eosinophils Relative: 2 %
HCT: 38.7 % (ref 35.0–45.0)
Hemoglobin: 12.6 g/dL (ref 11.7–15.5)
Lymphs Abs: 1420 cells/uL (ref 850–3900)
MCH: 31.6 pg (ref 27.0–33.0)
MCHC: 32.6 g/dL (ref 32.0–36.0)
MCV: 97 fL (ref 80.0–100.0)
MPV: 10.1 fL (ref 7.5–12.5)
Monocytes Relative: 6.9 %
Neutro Abs: 5616 cells/uL (ref 1500–7800)
Neutrophils Relative %: 72 %
Platelets: 265 10*3/uL (ref 140–400)
RBC: 3.99 10*6/uL (ref 3.80–5.10)
RDW: 11.9 % (ref 11.0–15.0)
Total Lymphocyte: 18.2 %
WBC: 7.8 10*3/uL (ref 3.8–10.8)

## 2021-01-02 NOTE — Patient Instructions (Signed)
Standing Labs We placed an order today for your standing lab work.   Please have your standing labs drawn in September and every 3 months  If possible, please have your labs drawn 2 weeks prior to your appointment so that the provider can discuss your results at your appointment.  Please note that you may see your imaging and lab results in MyChart before we have reviewed them. We may be awaiting multiple results to interpret others before contacting you. Please allow our office up to 72 hours to thoroughly review all of the results before contacting the office for clarification of your results.  We have open lab daily: Monday through Thursday from 1:30-4:30 PM and Friday from 1:30-4:00 PM at the office of Dr. Tylasia Fletchall, [facility].   Please be advised, all patients with office appointments requiring lab work will take precedent over walk-in lab work.  If possible, please come for your lab work on Monday and Friday afternoons, as you may experience shorter wait times. The office is located at [address], [address], [place], [place] [address] No appointment is necessary.   Labs are drawn by [facility]. Please bring your co-pay at the time of your lab draw.  You may receive a bill from [facility] for your lab work.  If you wish to have your labs drawn at another location, please call the office 24 hours in advance to send orders.  If you have any questions regarding directions or hours of operation,  please call [phone].   As a reminder, please drink plenty of water prior to coming for your lab work. Thanks!   Vaccines You are taking a medication(s) that can suppress your immune system.  The following immunizations are recommended: Flu annually Covid-19  Td/Tdap (tetanus, diphtheria, pertussis) every 10 years Pneumonia (Prevnar 15 then Pneumovax 23 at least 1 year apart.  Alternatively, can take Prevnar 20 without needing additional dose) Shingrix (after age 50): 2  doses from 4 weeks to 6 months apart  Please check with your PCP to make sure you are up to date.   If you test POSITIVE for COVID19 and have MILD to MODERATE symptoms: First, call your PCP if you would like to receive COVID19 treatment AND Hold your medications during the infection and for at least 1 week after your symptoms have resolved: Injectable medication (Benlysta, Cimzia, Cosentyx, Enbrel, Humira, Orencia, Remicade, Simponi, Stelara, Taltz, Tremfya) Methotrexate Leflunomide (Arava) Mycophenolate (Cellcept) Xeljanz, Olumiant, or Rinvoq If you take Actemra or Kevzara, you DO NOT need to hold these for COVID19 infection.  If you test POSITIVE for COVID19 and have NO symptoms: First, call your PCP if you would like to receive COVID19 treatment AND Hold your medications for at least 10 days after the day that you tested positive Injectable medication (Benlysta, Cimzia, Cosentyx, Enbrel, Humira, Orencia, Remicade, Simponi, Stelara, Taltz, Tremfya) Methotrexate Leflunomide (Arava) Mycophenolate (Cellcept) Xeljanz, Olumiant, or Rinvoq If you take Actemra or Kevzara, you DO NOT need to hold these for COVID19 infection.  If you have signs or symptoms of an infection or start antibiotics: First, call your PCP for workup of your infection. Hold your medication through the infection, until you complete your antibiotics, and until symptoms resolve if you take the following: Injectable medication (Actemra, Benlysta, Cimzia, Cosentyx, Enbrel, Humira, Kevzara, Orencia, Remicade, Simponi, Stelara, Taltz, Tremfya) Methotrexate Leflunomide (Arava) Mycophenolate (Cellcept) Xeljanz, Olumiant, or Rinvoq  Heart Disease Prevention   Your inflammatory disease increases your risk of heart disease which includes   heart attack, stroke, atrial fibrillation (irregular heartbeats), high blood pressure, heart failure and atherosclerosis (plaque in the arteries).  It is important to reduce your risk by:    Keep blood pressure, cholesterol, and blood sugar at healthy levels   Smoking Cessation   Maintain a healthy weight  BMI 20-25   Eat a healthy diet  Plenty of fresh fruit, vegetables, and whole grains  Limit saturated fats, foods high in sodium, and added sugars  DASH and Mediterranean diet   Increase physical activity  Recommend moderate physically activity for 150 minutes per week/ 30 minutes a day for five days a week These can be broken up into three separate ten-minute sessions during the day.   Reduce Stress  Meditation, slow breathing exercises, yoga, coloring books  Dental visits twice a year   

## 2021-01-07 ENCOUNTER — Ambulatory Visit: Payer: PPO | Admitting: Family Medicine

## 2021-01-17 ENCOUNTER — Ambulatory Visit: Payer: PPO | Admitting: Sports Medicine

## 2021-01-17 ENCOUNTER — Other Ambulatory Visit: Payer: Self-pay

## 2021-01-17 VITALS — BP 112/74 | Ht 66.5 in | Wt 130.0 lb

## 2021-01-17 DIAGNOSIS — M25562 Pain in left knee: Secondary | ICD-10-CM

## 2021-01-17 NOTE — Patient Instructions (Signed)
Thank you for coming to see me today. It was a pleasure. Today we talked about:   You have a tear in your lateral meniscus that is from aging over time.  For the next 1-2 months, wear your compression sleeve with activity and avoid excessive bending and kneeling of your leg.  Do the exercises that we gave you: straight leg raises, lateral leg raises, and isometric quad strengthening.  Please follow-up with [place] in 6 weeks if no improvement.  If you have any questions or concerns, please do not hesitate to call the office at [phone].  Best,   Arizona Constable, DO [place]

## 2021-01-17 NOTE — Progress Notes (Signed)
[name] [name] is a 70 y.o. female who presents to [facility] today for the following:  Left Knee Pain For about a year, she has noticed pain and stiffness in her left lateral knee mostly when she is sitting cross ligated on the floor She reports that she has to use her hands to pull her leg out in order to get it unlocked She was seen by her rheumatologist and had x-rays ordered about 6 months ago as she has a history of rheumatoid arthritis She was advised that there was not significant arthritis on her scans 2 weeks ago she also had lateral leg/knee pain and swelling that occurred after walking her dog, no specific injury She reports that she was having pain at night and felt that it was swollen She said that she was unable to extend her leg completely She now reports that this is improving and her pain is minimal She reports a history of a partial hamstring tear on the left side about 7 to 10 years ago, otherwise no known injuries   PMH reviewed.  She has a past medical history significant for rheumatoid arthritis on Plaquenil and leflunomide ROS as above. Medications reviewed.  Exam:  BP 112/74   Ht 5' 6.5" (1.689 m)   Wt 130 lb (59 kg)   LMP 07/14/1993   BMI 20.67 kg/m  Gen: Well NAD MSK:  Left Knee: - Inspection: no gross deformity. No swelling/effusion, erythema or bruising. Skin intact b/l - Palpation: Tenderness to palpation of left lateral joint line - ROM: full active ROM with flexion and extension in knee and hip, her range of motion does appear to be smoother on right compared to the left and she has crepitus noted with range of motion on left - Strength: 5/5 strength b/l - Neuro/vasc: NV intact b/l - Special Tests: - LIGAMENTS: negative anterior and posterior drawer, negative Lachman's, no MCL or LCL laxity b/l -- MENISCUS: negative McMurray's, positive Thessaly on left -- PF JOINT: nml patellar mobility bilaterally, with crepitus on left  Hips: normal ROM, negative  FABER and FADIR bilaterally    MSK ultrasound left knee:  Images were obtained both in the transverse and longitudinal plane. Patellar and quadriceps tendons were well visualized with no abnormalities. Hypoechoic collection in suprapatellar pouch consistent with mild-moderate effusion. Medial and lateral menisci were well visualized.  Lateral meniscus with hypoechoic area in central portion, consistent with degenerative tear.  Lateral joint line with mild spurring.  Hypoechoic fluid collection also noted over lateral joint line. No obvious Baker's cyst. Trochlear view with notable shallow groove. 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 Fields, MD and Arizona Constable, DO    No results found.   Assessment and Plan: 1) Acute pain of left knee Prior x-rays reviewed, does not appear to have been standing films.  She does have decreased joint space in the patellofemoral compartment consistent with moderate osteoarthritis.  Ultrasound and examination consistent with left lateral meniscus degenerative tear with mild to moderate effusion.  She is having improvement in her symptoms and physical exam is with good range of motion and strength testing which is reassuring.  We will have her perform straight leg raises, lateral leg raises, isometric quad strengthening.  We will also have her use a compression sleeve with activity.  She can ice her knee daily.  Decrease bending and avoid kneeling as well.  She can follow-up  in 6 weeks if no improvement.   Arizona Constable, D.O.  PGY-4 [facility]  01/17/2021 5:09 PM I observed and examined the patient with the [facility] resident and agree with assessment and plan.  Note reviewed and modified by me. Ila Mcgill, MD

## 2021-01-17 NOTE — Assessment & Plan Note (Signed)
Prior x-rays reviewed, does not appear to have been standing films.  She does have decreased joint space in the patellofemoral compartment consistent with moderate osteoarthritis.  Ultrasound and examination consistent with left lateral meniscus degenerative tear with mild to moderate effusion.  She is having improvement in her symptoms and physical exam is with good range of motion and strength testing which is reassuring.  We will have her perform straight leg raises, lateral leg raises, isometric quad strengthening.  We will also have her use a compression sleeve with activity.  She can ice her knee daily.  Decrease bending and avoid kneeling as well.  She can follow-up in 6 weeks if no improvement.

## 2021-02-04 ENCOUNTER — Other Ambulatory Visit: Payer: Self-pay | Admitting: Rheumatology

## 2021-02-04 ENCOUNTER — Telehealth: Payer: Self-pay

## 2021-02-04 DIAGNOSIS — M069 Rheumatoid arthritis, unspecified: Secondary | ICD-10-CM

## 2021-02-04 NOTE — Telephone Encounter (Signed)
Annia Belt sent RX and called with verbal.

## 2021-02-04 NOTE — Telephone Encounter (Signed)
Patient left a voicemail stating [facility] would not refill her Hydroxychloroquine and said it was delayed.  Patient states she had labwork in June.  Patient states she will be out of medication tonight and was hoping to pick it up today if possible.

## 2021-02-04 NOTE — Telephone Encounter (Signed)
Last Visit: 01/02/2021  Next Visit: 06/04/2021  Labs: 01/02/2021 CBC WNL.  Creatinine is borderline elevated likely due to dehydration.  GFRWNL.  Rest of CMP WNL.  Eye exam:  04/17/2020 WNL   Current Dose per office note 01/02/2021: PLQ 200 mg BID M-F  [id number] arthritis involving multiple joints  Last Fill: 11/10/2020  Okay to refill Plaquenil?

## 2021-03-17 ENCOUNTER — Other Ambulatory Visit: Payer: Self-pay | Admitting: Rheumatology

## 2021-03-19 NOTE — Telephone Encounter (Signed)
Next Visit: 06/04/2021  Last Visit: 01/02/2021  Last Fill: 12/17/2020  DX:  Rheumatoid arthritis involving multiple joints   Current Dose per office note 01/02/2021: Arava 10 mg daily,  Labs: 01/02/2021 CBC WNL.  Creatinine is borderline elevated likely due to dehydration.  GFR WNL.  Rest of CMP WNL.  Patient advised she is due for labs this month.   Okay to refill Arava?

## 2021-04-15 ENCOUNTER — Other Ambulatory Visit: Payer: Self-pay | Admitting: *Deleted

## 2021-04-15 DIAGNOSIS — Z79899 Other long term (current) drug therapy: Secondary | ICD-10-CM | POA: Diagnosis not present

## 2021-04-16 LAB — CBC WITH DIFFERENTIAL/PLATELET
Absolute Monocytes: 599 cells/uL (ref 200–950)
Basophils Absolute: 63 cells/uL (ref 0–200)
Basophils Relative: 1 %
Eosinophils Absolute: 252 cells/uL (ref 15–500)
Eosinophils Relative: 4 %
HCT: 38.3 % (ref 35.0–45.0)
Hemoglobin: 12.4 g/dL (ref 11.7–15.5)
Lymphs Abs: 1512 cells/uL (ref 850–3900)
MCH: 31.1 pg (ref 27.0–33.0)
MCHC: 32.4 g/dL (ref 32.0–36.0)
MCV: 96 fL (ref 80.0–100.0)
MPV: 10.5 fL (ref 7.5–12.5)
Monocytes Relative: 9.5 %
Neutro Abs: 3875 cells/uL (ref 1500–7800)
Neutrophils Relative %: 61.5 %
Platelets: 255 10*3/uL (ref 140–400)
RBC: 3.99 10*6/uL (ref 3.80–5.10)
RDW: 11.7 % (ref 11.0–15.0)
Total Lymphocyte: 24 %
WBC: 6.3 10*3/uL (ref 3.8–10.8)

## 2021-04-16 LAB — COMPLETE METABOLIC PANEL WITH GFR
AG Ratio: 2.3 (calc) (ref 1.0–2.5)
ALT: 9 U/L (ref 6–29)
AST: 20 U/L (ref 10–35)
Albumin: 4.3 g/dL (ref 3.6–5.1)
Alkaline phosphatase (APISO): 65 U/L (ref 37–153)
BUN: 16 mg/dL (ref 7–25)
CO2: 32 mmol/L (ref 20–32)
Calcium: 9.7 mg/dL (ref 8.6–10.4)
Chloride: 104 mmol/L (ref 98–110)
Creat: 0.95 mg/dL (ref 0.60–1.00)
Globulin: 1.9 g/dL (calc) (ref 1.9–3.7)
Glucose, Bld: 104 mg/dL — ABNORMAL HIGH (ref 65–99)
Potassium: 4.8 mmol/L (ref 3.5–5.3)
Sodium: 142 mmol/L (ref 135–146)
Total Bilirubin: 0.4 mg/dL (ref 0.2–1.2)
Total Protein: 6.2 g/dL (ref 6.1–8.1)
eGFR: 64 mL/min/{1.73_m2} (ref >=60)

## 2021-04-16 NOTE — Progress Notes (Signed)
CBC and CMP are normal.  Glucose is mildly elevated, probably not a fasting sample.

## 2021-04-18 DIAGNOSIS — M069 Rheumatoid arthritis, unspecified: Secondary | ICD-10-CM | POA: Diagnosis not present

## 2021-04-18 DIAGNOSIS — H2513 Age-related nuclear cataract, bilateral: Secondary | ICD-10-CM | POA: Diagnosis not present

## 2021-04-18 DIAGNOSIS — H43813 Vitreous degeneration, bilateral: Secondary | ICD-10-CM | POA: Diagnosis not present

## 2021-04-18 DIAGNOSIS — H5203 Hypermetropia, bilateral: Secondary | ICD-10-CM | POA: Diagnosis not present

## 2021-05-01 ENCOUNTER — Other Ambulatory Visit: Payer: Self-pay | Admitting: Physician Assistant

## 2021-05-01 DIAGNOSIS — M069 Rheumatoid arthritis, unspecified: Secondary | ICD-10-CM

## 2021-05-01 NOTE — Telephone Encounter (Signed)
Next Visit: 06/04/2021   Last Visit: 01/02/2021   Last Fill: 02/04/2021  DX:  Rheumatoid arthritis involving multiple joints    Current Dose per office note 01/02/2021:  PLQ 200 mg BID M-F  Labs: 04/15/2021 CBC and CMP are normal.  Glucose is mildly elevated, probably not a fasting sample.  Okay to refill PLQ?

## 2021-05-01 NOTE — Telephone Encounter (Signed)
PLQ eye exam updated on 04/17/21.  Ok to provide 90-day supply.

## 2021-05-16 DIAGNOSIS — Z23 Encounter for immunization: Secondary | ICD-10-CM | POA: Diagnosis not present

## 2021-05-16 DIAGNOSIS — M0510 Rheumatoid lung disease with rheumatoid arthritis of unspecified site: Secondary | ICD-10-CM | POA: Diagnosis not present

## 2021-05-16 DIAGNOSIS — M810 Age-related osteoporosis without current pathological fracture: Secondary | ICD-10-CM | POA: Diagnosis not present

## 2021-05-16 DIAGNOSIS — E785 Hyperlipidemia, unspecified: Secondary | ICD-10-CM | POA: Diagnosis not present

## 2021-05-21 NOTE — Progress Notes (Signed)
Office Visit Note  Patient: [name] [name]             Date of Birth: 1951/07/12           MRN: [id number]             PCP: Michael Boston, MD Referring: Michael Boston, MD Visit Date: 06/04/2021 Occupation: @GUAROCC @  Subjective:  Medication monitoring   History of Present Illness: [name] [name] is a 70 y.o. female with history of rheumatoid arthritis and osteoarthritis.  Patient is currently taking Arava 10 mg 1 tablet by mouth daily and Plaquenil 200 mg 1 tablet by mouth twice daily Monday through Friday.  She is tolerating both medications without any side effects.  She denies any recent rheumatoid arthritis flares.  She states that most days he does not and feels that she has rheumatoid arthritis.  She is not experiencing any joint swelling, nocturnal pain, or morning stiffness.  She has not had any difficulty with ADLs.  She denies any recent infections.  She received the annual flu vaccine as well as the COVID booster.  She is considering receiving the Shingrix vaccination in the future.    Activities of Daily Living:  Patient reports morning stiffness for 0  none .   Patient Denies nocturnal pain.  Difficulty dressing/grooming: Denies Difficulty climbing stairs: Denies Difficulty getting out of chair: Denies Difficulty using hands for taps, buttons, cutlery, and/or writing: Denies  Review of Systems  Constitutional:  Negative for fatigue.  HENT:  Negative for mouth sores, mouth dryness and nose dryness.   Eyes:  Negative for pain, visual disturbance and dryness.  Respiratory:  Negative for cough, hemoptysis, shortness of breath and difficulty breathing.   Cardiovascular:  Negative for chest pain, palpitations, hypertension and swelling in legs/feet.  Gastrointestinal:  Negative for blood in stool, constipation and diarrhea.  Endocrine: Negative for excessive thirst and increased urination.  Genitourinary:  Negative for difficulty urinating and painful urination.  Musculoskeletal:   Negative for joint pain, joint pain, joint swelling, myalgias, muscle weakness, morning stiffness, muscle tenderness and myalgias.  Skin:  Negative for color change, pallor, rash, hair loss, nodules/bumps, skin tightness, ulcers and sensitivity to sunlight.  Allergic/Immunologic: Negative for susceptible to infections.  Neurological:  Negative for dizziness, numbness, headaches and weakness.  Hematological:  Negative for bruising/bleeding tendency and swollen glands.  Psychiatric/Behavioral:  Negative for depressed mood and sleep disturbance. The patient is not nervous/anxious.    PMFS History:  Patient Active Problem List   Diagnosis Date Noted   Acute pain of left knee 01/17/2021   Primary osteoarthritis of both knees 01/08/2017   Primary osteoarthritis of both feet 01/08/2017   History of pleural effusion 12/01/2016   History of recurrent pneumonia 12/01/2016   History of hyperlipidemia 12/01/2016   High risk medication use 12/01/2016   Trigger thumb, left thumb 12/01/2016   Elevated sed rate 12/01/2016   History of fracture of right hip 12/01/2016   Primary osteoarthritis of both hands 12/01/2016   Rheumatoid arthritis involving multiple joints ([place]) 04/09/2015   HCAP (healthcare-associated pneumonia)    Paroxysmal atrial fibrillation ([place])    Tendonitis 04/03/2015   Pleural effusion associated with pulmonary infection 03/27/2015   Orthostatic syncope    Syncope 03/18/2015   Osteoporosis    Hyperlipidemia 11/14/2013    Past Medical History:  Diagnosis Date   Elevated cholesterol    Migraines    occular   Osteoporosis 2015   based on hip fracture from fall  DEXA 01/2018 T score -2.3   Pleural effusion 9/206   Bilateral effusions, s/p thora on R c/w exudate   Pneumonia 03/2015   R   RA (rheumatoid arthritis) ([place])    Skin cancer (melanoma) (HCC)    Right Arm, nose.     Family History  Problem Relation Age of Onset   Heart attack Father 65   Heart disease Father     Osteoporosis Sister    High Cholesterol Sister    Melanoma Daughter    Melanoma Daughter    Past Surgical History:  Procedure Laterality Date   COLONOSCOPY WITH PROPOFOL N/A 11/04/2016   Procedure: COLONOSCOPY WITH PROPOFOL;  Surgeon: Garlan Fair, MD;  Location: [facility];  Service: Endoscopy;  Laterality: N/A;   FINGER SURGERY     HIP ARTHROPLASTY Right 11/14/2013   Procedure: RIGHT CANNULATED HIP PINNING;  Surgeon: Alta Corning, MD;  Location: [facility];  Service: Orthopedics;  Laterality: Right;  BIOMET   SQUAMOUS CELL CARCINOMA EXCISION  2021   behind knee    VIDEO BRONCHOSCOPY Bilateral 04/05/2015   Procedure: VIDEO BRONCHOSCOPY WITHOUT FLUORO;  Surgeon: Juanito Doom, MD;  Location: [facility];  Service: Cardiopulmonary;  Laterality: Bilateral;   Social History   Social History Narrative   Works for [employer] as a therapist   Not married   Two children (62 and 65- both daughters) local and she has one Conservation officer, historic buildings History  Administered Date(s) Administered   Influenza, High Dose Seasonal PF 04/28/2017   Influenza-Unspecified 04/23/2015   PFIZER(Purple Top)SARS-COV-2 Vaccination 05/16/2021   Pneumococcal Conjugate-13 07/29/2016   Pneumococcal Polysaccharide-23 10/09/2014   Tdap 01/08/2016     Objective: Vital Signs: BP 106/67 (BP Location: Left Arm, Patient Position: Sitting, Cuff Size: Small)   Pulse 79   Resp 12   Ht 5' 6.5" (1.689 m)   Wt 129 lb 12.8 oz (58.9 kg)   LMP 07/14/1993   BMI 20.64 kg/m    Physical Exam Vitals and nursing note reviewed.  Constitutional:      Appearance: She is well-developed.  HENT:     Head: Normocephalic and atraumatic.  Eyes:     Conjunctiva/sclera: Conjunctivae normal.  Pulmonary:     Effort: Pulmonary effort is normal.  Abdominal:     Palpations: Abdomen is soft.  Musculoskeletal:     Cervical back: Normal range of motion.  Skin:    General: Skin is warm and dry.     Capillary Refill:  Capillary refill takes less than 2 seconds.  Neurological:     Mental Status: She is alert and oriented to person, place, and time.  Psychiatric:        Behavior: Behavior normal.     Musculoskeletal Exam: C-spine, thoracic spine, lumbar spine have good range of motion with no discomfort.  No midline spinal tenderness or SI joint tenderness.  Shoulder joints, elbow joints, wrist joints, MCPs, PIPs, DIPs have good range of motion with no synovitis.  She has PIP and DIP thickening consistent with osteoarthritis of both hands.  CMC joint prominence noted bilaterally.  Complete fist formation bilaterally.  Hip joints have good range of motion with no groin pain.  No tenderness over trochanteric bursa bilaterally.  Knee joints have good range of motion with no warmth or effusion.  Ankle joints have good range of motion with no tenderness or joint swelling.  CDAI Exam: CDAI Score: 0  Patient Global: 0 mm; Provider Global: 0 mm Swollen: 0 ;  Tender: 0  Joint Exam 06/04/2021   No joint exam has been documented for this visit   There is currently no information documented on the homunculus. Go to the Rheumatology activity and complete the homunculus joint exam.  Investigation: No additional findings.  Imaging: No results found.  Recent Labs: Lab Results  Component Value Date   WBC 6.3 04/15/2021   HGB 12.4 04/15/2021   PLT 255 04/15/2021   NA 142 04/15/2021   K 4.8 04/15/2021   CL 104 04/15/2021   CO2 32 04/15/2021   GLUCOSE 104 (H) 04/15/2021   BUN 16 04/15/2021   CREATININE 0.95 04/15/2021   BILITOT 0.4 04/15/2021   ALKPHOS 77 08/04/2019   AST 20 04/15/2021   ALT 9 04/15/2021   PROT 6.2 04/15/2021   ALBUMIN 4.5 08/04/2019   CALCIUM 9.7 04/15/2021   GFRAA 70 01/02/2021    Speciality Comments: PLQ Eye exam: 04/17/2021 WNL @ [facility]. Follow up in 1 year. Arava started in 2017 by Dr. Lorraine Lax started 01/2017 Fosamax 2015-2018 offx 2years then resumed  2020  Procedures:  No procedures performed Allergies: Levaquin [levofloxacin] and Cephalexin   Assessment / Plan:     Visit Diagnoses: Rheumatoid arthritis involving multiple joints ([place]): She has no joint tenderness or synovitis on examination today.  She has not had any signs or symptoms of a rheumatoid arthritis flare recently.  She has clinically been doing well on Arava 10 mg 1 tablet by mouth daily and Plaquenil 200 mg 1 tablet by mouth twice daily Monday through Friday.  She continues to tolerate both medications without any side effects and has not missed any doses recently.  According to the patient most days she does not feel as though she even has rheumatoid arthritis since her disease has been so well controlled on the current regimen.  She has not had any difficulty with ADLs, morning stiffness, or nocturnal pain.  She has not had any other systemic manifestations of rheumatoid arthritis.  She will remain on the current treatment regimen.  She was advised to notify us if she develops increased joint pain or joint swelling.  She will follow-up in the office in 5 months.  High risk medication use - Arava 10 mg 1 tablet by mouth daily and Plaquenil 200 mg BID M-F.  CBC and CMP updated on 04/15/21.  She will be due to update lab work in January and every 3 months. Standing orders for CBC and CMP are in place.  PLQ Eye exam: 04/17/2021 WNL @ [facility]. Follow up in 1 year. Arava started in 2017 by Dr. Charlestine Night.  PLQ started 01/2017 Fosamax 2015-2018 offx 2years then resumed 2020.  Discussed the importance of holding [place] if she develops signs or symptoms of an infection and to resume once the infection has completely cleared.  She voiced understanding.   Primary osteoarthritis of both hands: She has PIP and DIP thickening consistent with osteoarthritis of both hands.  CMC joint prominence noted bilaterally.  Complete fist formation bilaterally.  Discussed the importance of joint  protection and muscle strengthening.  Primary osteoarthritis of both knees: She has good range of motion of both knee joints with no discomfort.  No warmth or effusion was noted.  She has not had any difficulty climbing steps or rising from a seated position.  Primary osteoarthritis of both feet: She is not experiencing any discomfort in her feet at this time.  She is good range of motion of both ankle joints with  no tenderness or inflammation.  Discussed the importance of wearing proper fitting shoes.  Osteoporosis of multiple sites - She takes Fosamax 70 mg 1 tablet by mouth once weekly.  DEXA scan October 16, 2020 showed T score of -2.6.  She continues to take a calcium supplement on a daily basis.  No recent falls or fractures.   History of fracture of right hip  Other medical conditions are listed as follows:  Paroxysmal atrial fibrillation (HCC)  History of hyperlipidemia  History of bronchiectasis  History of pleural effusion  Orders: No orders of the defined types were placed in this encounter.  No orders of the defined types were placed in this encounter.     Follow-Up Instructions: Return in about 5 months (around 11/02/2021) for Rheumatoid arthritis, Osteoarthritis.   Ofilia Neas, PA-C  Note - This record has been created using Dragon software.  Chart creation errors have been sought, but may not always  have been located. Such creation errors do not reflect on  the standard of medical care.

## 2021-05-22 DIAGNOSIS — Q828 Other specified congenital malformations of skin: Secondary | ICD-10-CM | POA: Diagnosis not present

## 2021-05-22 DIAGNOSIS — L570 Actinic keratosis: Secondary | ICD-10-CM | POA: Diagnosis not present

## 2021-05-22 DIAGNOSIS — Z23 Encounter for immunization: Secondary | ICD-10-CM | POA: Diagnosis not present

## 2021-05-22 DIAGNOSIS — L821 Other seborrheic keratosis: Secondary | ICD-10-CM | POA: Diagnosis not present

## 2021-05-22 DIAGNOSIS — L820 Inflamed seborrheic keratosis: Secondary | ICD-10-CM | POA: Diagnosis not present

## 2021-06-04 ENCOUNTER — Other Ambulatory Visit: Payer: Self-pay

## 2021-06-04 ENCOUNTER — Encounter: Payer: Self-pay | Admitting: Physician Assistant

## 2021-06-04 ENCOUNTER — Ambulatory Visit: Payer: PPO | Admitting: Physician Assistant

## 2021-06-04 VITALS — BP 106/67 | HR 79 | Resp 12 | Ht 66.5 in | Wt 129.8 lb

## 2021-06-04 DIAGNOSIS — M170 Bilateral primary osteoarthritis of knee: Secondary | ICD-10-CM | POA: Diagnosis not present

## 2021-06-04 DIAGNOSIS — M069 Rheumatoid arthritis, unspecified: Secondary | ICD-10-CM

## 2021-06-04 DIAGNOSIS — M19071 Primary osteoarthritis, right ankle and foot: Secondary | ICD-10-CM | POA: Diagnosis not present

## 2021-06-04 DIAGNOSIS — Z79899 Other long term (current) drug therapy: Secondary | ICD-10-CM

## 2021-06-04 DIAGNOSIS — M19072 Primary osteoarthritis, left ankle and foot: Secondary | ICD-10-CM

## 2021-06-04 DIAGNOSIS — I480 Paroxysmal atrial fibrillation: Secondary | ICD-10-CM | POA: Diagnosis not present

## 2021-06-04 DIAGNOSIS — M19042 Primary osteoarthritis, left hand: Secondary | ICD-10-CM

## 2021-06-04 DIAGNOSIS — Z8781 Personal history of (healed) traumatic fracture: Secondary | ICD-10-CM | POA: Diagnosis not present

## 2021-06-04 DIAGNOSIS — M19041 Primary osteoarthritis, right hand: Secondary | ICD-10-CM

## 2021-06-04 DIAGNOSIS — Z8639 Personal history of other endocrine, nutritional and metabolic disease: Secondary | ICD-10-CM

## 2021-06-04 DIAGNOSIS — Z8709 Personal history of other diseases of the respiratory system: Secondary | ICD-10-CM | POA: Diagnosis not present

## 2021-06-04 DIAGNOSIS — M810 Age-related osteoporosis without current pathological fracture: Secondary | ICD-10-CM

## 2021-06-04 NOTE — Patient Instructions (Signed)
Standing Labs We placed an order today for your standing lab work.   Please have your standing labs drawn in January and every 3 months   If possible, please have your labs drawn 2 weeks prior to your appointment so that the provider can discuss your results at your appointment.  Please note that you may see your imaging and lab results in MyChart before we have reviewed them. We may be awaiting multiple results to interpret others before contacting you. Please allow our office up to 72 hours to thoroughly review all of the results before contacting the office for clarification of your results.  We have open lab daily: Monday through Thursday from 1:30-4:30 PM and Friday from 1:30-4:00 PM at the office of Dr. Shaili Deveshwar, [facility].   Please be advised, all patients with office appointments requiring lab work will take precedent over walk-in lab work.  If possible, please come for your lab work on Monday and Friday afternoons, as you may experience shorter wait times. The office is located at [address], [address], [place], [place] [address] No appointment is necessary.   Labs are drawn by [facility]. Please bring your co-pay at the time of your lab draw.  You may receive a bill from [facility] for your lab work.  If you wish to have your labs drawn at another location, please call the office 24 hours in advance to send orders.  If you have any questions regarding directions or hours of operation,  please call [phone].   As a reminder, please drink plenty of water prior to coming for your lab work. Thanks!  

## 2021-06-15 ENCOUNTER — Other Ambulatory Visit: Payer: Self-pay | Admitting: Physician Assistant

## 2021-06-17 NOTE — Telephone Encounter (Signed)
Next Visit: 10/29/2021  Last Visit: 06/04/2021  Last Fill: 03/19/2021  DX: Rheumatoid arthritis involving multiple joints   Current Dose per office note 06/04/2021: Arava 10 mg 1 tablet by mouth daily  Labs: 04/15/2021 CBC and CMP are normal.  Glucose is mildly elevated, probably not a fasting sample.  Okay to refill Arava?

## 2021-07-22 ENCOUNTER — Other Ambulatory Visit: Payer: Self-pay | Admitting: *Deleted

## 2021-07-22 DIAGNOSIS — Z79899 Other long term (current) drug therapy: Secondary | ICD-10-CM | POA: Diagnosis not present

## 2021-07-23 ENCOUNTER — Other Ambulatory Visit: Payer: Self-pay | Admitting: *Deleted

## 2021-07-23 DIAGNOSIS — M069 Rheumatoid arthritis, unspecified: Secondary | ICD-10-CM

## 2021-07-23 LAB — COMPLETE METABOLIC PANEL WITH GFR
AG Ratio: 1.8 (calc) (ref 1.0–2.5)
ALT: 10 U/L (ref 6–29)
AST: 23 U/L (ref 10–35)
Albumin: 4.2 g/dL (ref 3.6–5.1)
Alkaline phosphatase (APISO): 66 U/L (ref 37–153)
BUN: 19 mg/dL (ref 7–25)
CO2: 30 mmol/L (ref 20–32)
Calcium: 9.9 mg/dL (ref 8.6–10.4)
Chloride: 105 mmol/L (ref 98–110)
Creat: 0.67 mg/dL (ref 0.60–1.00)
Globulin: 2.3 g/dL (calc) (ref 1.9–3.7)
Glucose, Bld: 94 mg/dL (ref 65–99)
Potassium: 4.9 mmol/L (ref 3.5–5.3)
Sodium: 142 mmol/L (ref 135–146)
Total Bilirubin: 0.4 mg/dL (ref 0.2–1.2)
Total Protein: 6.5 g/dL (ref 6.1–8.1)
eGFR: 93 mL/min/{1.73_m2} (ref >=60)

## 2021-07-23 LAB — CBC WITH DIFFERENTIAL/PLATELET
Absolute Monocytes: 616 cells/uL (ref 200–950)
Basophils Absolute: 79 cells/uL (ref 0–200)
Basophils Relative: 1.3 %
Eosinophils Absolute: 207 cells/uL (ref 15–500)
Eosinophils Relative: 3.4 %
HCT: 38.3 % (ref 35.0–45.0)
Hemoglobin: 12.7 g/dL (ref 11.7–15.5)
Lymphs Abs: 1446 cells/uL (ref 850–3900)
MCH: 32.2 pg (ref 27.0–33.0)
MCHC: 33.2 g/dL (ref 32.0–36.0)
MCV: 97.2 fL (ref 80.0–100.0)
MPV: 10.4 fL (ref 7.5–12.5)
Monocytes Relative: 10.1 %
Neutro Abs: 3752 cells/uL (ref 1500–7800)
Neutrophils Relative %: 61.5 %
Platelets: 257 10*3/uL (ref 140–400)
RBC: 3.94 10*6/uL (ref 3.80–5.10)
RDW: 11.4 % (ref 11.0–15.0)
Total Lymphocyte: 23.7 %
WBC: 6.1 10*3/uL (ref 3.8–10.8)

## 2021-07-23 MED ORDER — HYDROXYCHLOROQUINE SULFATE 200 MG PO TABS: ORAL_TABLET | ORAL | 0 refills | Status: DC

## 2021-07-23 NOTE — Progress Notes (Signed)
CBC and CMP normal

## 2021-07-23 NOTE — Telephone Encounter (Signed)
Next Visit: 10/29/2021  Last Visit: 06/04/2021  Labs: 07/22/2021 CBC and CMP normal.  Eye exam: 04/17/2021 WNL   Current Dose per office note 06/04/2021: Plaquenil 200 mg BID M-F.  [id number] arthritis involving multiple joints  Last Fill: 05/01/2021  Okay to refill Plaquenil?

## 2021-09-13 ENCOUNTER — Other Ambulatory Visit: Payer: Self-pay | Admitting: Physician Assistant

## 2021-09-13 NOTE — Telephone Encounter (Signed)
Next Visit: 10/29/2021 ?  ?Last Visit: 06/04/2021 ?  ?Last Fill: 06/17/2021 ? ?DX: Rheumatoid arthritis involving multiple joints  ?  ?Current Dose per office note 06/04/2021: Arava 10 mg 1 tablet by mouth daily ?  ?Labs: 07/22/2021 CBC and CMP normal. ? ?Okay to refill Arava?  ?

## 2021-10-22 ENCOUNTER — Other Ambulatory Visit: Payer: Self-pay | Admitting: *Deleted

## 2021-10-22 DIAGNOSIS — Z79899 Other long term (current) drug therapy: Secondary | ICD-10-CM | POA: Diagnosis not present

## 2021-10-23 ENCOUNTER — Other Ambulatory Visit: Payer: Self-pay | Admitting: *Deleted

## 2021-10-23 DIAGNOSIS — M069 Rheumatoid arthritis, unspecified: Secondary | ICD-10-CM

## 2021-10-23 LAB — COMPLETE METABOLIC PANEL WITH GFR
AG Ratio: 1.9 (calc) (ref 1.0–2.5)
ALT: 9 U/L (ref 6–29)
AST: 23 U/L (ref 10–35)
Albumin: 4.3 g/dL (ref 3.6–5.1)
Alkaline phosphatase (APISO): 68 U/L (ref 37–153)
BUN: 20 mg/dL (ref 7–25)
CO2: 29 mmol/L (ref 20–32)
Calcium: 9.8 mg/dL (ref 8.6–10.4)
Chloride: 102 mmol/L (ref 98–110)
Creat: 0.9 mg/dL (ref 0.60–1.00)
Globulin: 2.3 g/dL (calc) (ref 1.9–3.7)
Glucose, Bld: 103 mg/dL — ABNORMAL HIGH (ref 65–99)
Potassium: 4.7 mmol/L (ref 3.5–5.3)
Sodium: 141 mmol/L (ref 135–146)
Total Bilirubin: 0.5 mg/dL (ref 0.2–1.2)
Total Protein: 6.6 g/dL (ref 6.1–8.1)
eGFR: 68 mL/min/{1.73_m2} (ref >=60)

## 2021-10-23 LAB — CBC WITH DIFFERENTIAL/PLATELET
Absolute Monocytes: 620 cells/uL (ref 200–950)
Basophils Absolute: 73 cells/uL (ref 0–200)
Basophils Relative: 1.1 %
Eosinophils Absolute: 218 cells/uL (ref 15–500)
Eosinophils Relative: 3.3 %
HCT: 39.2 % (ref 35.0–45.0)
Hemoglobin: 13 g/dL (ref 11.7–15.5)
Lymphs Abs: 1643 cells/uL (ref 850–3900)
MCH: 31.9 pg (ref 27.0–33.0)
MCHC: 33.2 g/dL (ref 32.0–36.0)
MCV: 96.1 fL (ref 80.0–100.0)
MPV: 9.9 fL (ref 7.5–12.5)
Monocytes Relative: 9.4 %
Neutro Abs: 4046 cells/uL (ref 1500–7800)
Neutrophils Relative %: 61.3 %
Platelets: 296 10*3/uL (ref 140–400)
RBC: 4.08 10*6/uL (ref 3.80–5.10)
RDW: 11.7 % (ref 11.0–15.0)
Total Lymphocyte: 24.9 %
WBC: 6.6 10*3/uL (ref 3.8–10.8)

## 2021-10-23 MED ORDER — HYDROXYCHLOROQUINE SULFATE 200 MG PO TABS: ORAL_TABLET | ORAL | 0 refills | Status: DC

## 2021-10-23 NOTE — Progress Notes (Signed)
CBC and CMP normal

## 2021-10-23 NOTE — Telephone Encounter (Signed)
Next Visit: 11/26/2021 ? ?Last Visit: 06/04/2021 ? ?Labs: 10/23/2021,  ? ?Eye exam: 04/17/2021 WNL CBC and CMP normal. ? ?Current Dose per office note 06/04/2021: Plaquenil 200 mg BID M-F ? ?DX: Rheumatoid arthritis involving multiple joints ? ?Last Fill: 07/23/2021 ? ?Okay to refill Plaquenil? ? ?

## 2021-10-29 ENCOUNTER — Ambulatory Visit: Payer: PPO | Admitting: Rheumatology

## 2021-11-07 DIAGNOSIS — E785 Hyperlipidemia, unspecified: Secondary | ICD-10-CM | POA: Diagnosis not present

## 2021-11-07 DIAGNOSIS — R7989 Other specified abnormal findings of blood chemistry: Secondary | ICD-10-CM | POA: Diagnosis not present

## 2021-11-07 DIAGNOSIS — M810 Age-related osteoporosis without current pathological fracture: Secondary | ICD-10-CM | POA: Diagnosis not present

## 2021-11-12 NOTE — Progress Notes (Signed)
? ?Office Visit Note ? ?Patient: [name] [name]             ?Date of Birth: 1950-09-02           ?MRN: [id number]             ?PCP: Michael Boston, MD ?Referring: Michael Boston, MD ?Visit Date: 11/26/2021 ?Occupation: '@GUAROCC'$ @ ? ?Subjective:  ?Medication management ? ?History of Present Illness: [name] [name] is a 71 y.o. female with history of rheumatoid arthritis and osteoarthritis.  She states she has been tolerating leflunomide without any side effects.  She has been doing well without any increased joint swelling.  She developed left planter fasciitis which is better.  She has been experiencing some discomfort in her right knee joint because of the left planter fasciitis.  She denies any shortness of breath or cough currently.  ? ?Activities of Daily Living:  ?Patient reports morning stiffness for 0 minutes.   ?Patient Denies nocturnal pain.  ?Difficulty dressing/grooming: Denies ?Difficulty climbing stairs: Denies ?Difficulty getting out of chair: Denies ?Difficulty using hands for taps, buttons, cutlery, and/or writing: Denies ? ?Review of Systems  ?Constitutional:  Negative for fatigue.  ?HENT:  Negative for mouth sores, mouth dryness and nose dryness.   ?Eyes:  Negative for pain, itching and dryness.  ?Respiratory:  Negative for shortness of breath and difficulty breathing.   ?Cardiovascular:  Negative for chest pain and palpitations.  ?Gastrointestinal:  Negative for blood in stool, constipation and diarrhea.  ?Endocrine: Negative for increased urination.  ?Genitourinary:  Negative for difficulty urinating.  ?Musculoskeletal:  Negative for joint pain, joint pain, joint swelling, myalgias, morning stiffness, muscle tenderness and myalgias.  ?Skin:  Negative for color change, rash and redness.  ?Allergic/Immunologic: Negative for susceptible to infections.  ?Neurological:  Negative for dizziness, numbness, headaches, memory loss and weakness.  ?Hematological:  Positive for bruising/bleeding tendency.   ?Psychiatric/Behavioral:  Negative for confusion.   ? ?PMFS History:  ?Patient Active Problem List  ? Diagnosis Date Noted  ? Acute pain of left knee 01/17/2021  ? Primary osteoarthritis of both knees 01/08/2017  ? Primary osteoarthritis of both feet 01/08/2017  ? History of pleural effusion 12/01/2016  ? History of recurrent pneumonia 12/01/2016  ? History of hyperlipidemia 12/01/2016  ? High risk medication use 12/01/2016  ? Trigger thumb, left thumb 12/01/2016  ? Elevated sed rate 12/01/2016  ? History of fracture of right hip 12/01/2016  ? Primary osteoarthritis of both hands 12/01/2016  ? Rheumatoid arthritis involving multiple joints ([place]) 04/09/2015  ? HCAP (healthcare-associated pneumonia)   ? Paroxysmal atrial fibrillation (HCC)   ? Tendonitis 04/03/2015  ? Pleural effusion associated with pulmonary infection 03/27/2015  ? Orthostatic syncope   ? Syncope 03/18/2015  ? Osteoporosis   ? Hyperlipidemia 11/14/2013  ?  ?Past Medical History:  ?Diagnosis Date  ? Elevated cholesterol   ? Migraines   ? occular  ? Osteoporosis 2015  ? based on hip fracture from fall  DEXA 01/2018 T score -2.3  ? Pleural effusion 9/206  ? Bilateral effusions, s/p thora on R c/w exudate  ? Pneumonia 03/2015  ? R  ? RA (rheumatoid arthritis) ([place])   ? Skin cancer (melanoma) ([place])   ? Right Arm, nose.   ?  ?Family History  ?Problem Relation Age of Onset  ? Heart attack Father 88  ? Heart disease Father   ? Osteoporosis Sister   ? High Cholesterol Sister   ? Melanoma Daughter   ? Melanoma  Daughter   ? ?Past Surgical History:  ?Procedure Laterality Date  ? COLONOSCOPY WITH PROPOFOL N/A 11/04/2016  ? Procedure: COLONOSCOPY WITH PROPOFOL;  Surgeon: Garlan Fair, MD;  Location: [facility];  Service: Endoscopy;  Laterality: N/A;  ? FINGER SURGERY    ? HIP ARTHROPLASTY Right 11/14/2013  ? Procedure: RIGHT CANNULATED HIP PINNING;  Surgeon: Alta Corning, MD;  Location: [facility];  Service: Orthopedics;  Laterality: Right;  BIOMET  ? SQUAMOUS  CELL CARCINOMA EXCISION  2021  ? behind knee   ? VIDEO BRONCHOSCOPY Bilateral 04/05/2015  ? Procedure: VIDEO BRONCHOSCOPY WITHOUT FLUORO;  Surgeon: Juanito Doom, MD;  Location: [facility];  Service: Cardiopulmonary;  Laterality: Bilateral;  ? ?Social History  ? ?Social History Narrative  ? 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 Not married  ? Two children (8 and 10- both daughters) local and she has one [place]  ? ?Immunization History  ?Administered Date(s) Administered  ? Influenza, High Dose Seasonal PF 04/28/2017  ? Influenza-Unspecified 04/23/2015  ? PFIZER(Purple Top)SARS-COV-2 Vaccination 08/05/2019, 08/26/2019, 04/19/2020, 11/27/2020, 05/16/2021  ? Pneumococcal Conjugate-13 07/29/2016  ? Pneumococcal Polysaccharide-23 10/09/2014  ? Tdap 01/08/2016  ?  ? ?Objective: ?Vital Signs: BP 107/68 (BP Location: Left Arm, Patient Position: Sitting, Cuff Size: Normal)   Pulse 92   Ht 5' 6.5" (1.689 m)   Wt 126 lb 6.4 oz (57.3 kg)   LMP 07/14/1993   BMI 20.10 kg/m?   ? ?Physical Exam ?Vitals and nursing note reviewed.  ?Constitutional:   ?   Appearance: She is well-developed.  ?HENT:  ?   Head: Normocephalic and atraumatic.  ?Eyes:  ?   Conjunctiva/sclera: Conjunctivae normal.  ?Cardiovascular:  ?   Rate and Rhythm: Normal rate and regular rhythm.  ?   Heart sounds: Normal heart sounds.  ?Pulmonary:  ?   Effort: Pulmonary effort is normal.  ?   Breath sounds: Normal breath sounds.  ?Abdominal:  ?   General: Bowel sounds are normal.  ?   Palpations: Abdomen is soft.  ?Musculoskeletal:  ?   Cervical back: Normal range of motion.  ?Lymphadenopathy:  ?   Cervical: No cervical adenopathy.  ?Skin: ?   General: Skin is warm and dry.  ?   Capillary Refill: Capillary refill takes less than 2 seconds.  ?Neurological:  ?   Mental Status: She is alert and oriented to person, place, and time.  ?Psychiatric:     ?   Behavior: Behavior normal.  ?  ? ?Musculoskeletal Exam: C-spine was in good range of motion.   Shoulder joints, elbow joints, wrist joints with good range of motion with no synovitis.  She had no synovitis over MCPs.  PIP and DIP thickening was noted.  Hip joints with good range of motion.  She had warmth swelling and effusion in her right knee joint.  Left knee joint with intact range of motion.  There was no tenderness over ankles or MTPs.  She had tenderness over left plantar fascia. ? ?CDAI Exam: ?CDAI Score: 2.4  ?Patient Global: 0 mm; Provider Global: 4 mm ?Swollen: 1 ; Tender: 1  ?Joint Exam 11/26/2021  ? ?   Right  Left  ?Knee  Swollen Tender     ? ? ? ?Investigation: ?No additional findings. ? ?Imaging: ?No results found. ? ?Recent Labs: ?Lab Results  ?Component Value Date  ? WBC 6.6 10/22/2021  ? HGB 13.0 10/22/2021  ? PLT 296 10/22/2021  ? NA 141 10/22/2021  ?  K 4.7 10/22/2021  ? CL 102 10/22/2021  ? CO2 29 10/22/2021  ? GLUCOSE 103 (H) 10/22/2021  ? BUN 20 10/22/2021  ? CREATININE 0.90 10/22/2021  ? BILITOT 0.5 10/22/2021  ? ALKPHOS 77 08/04/2019  ? AST 23 10/22/2021  ? ALT 9 10/22/2021  ? PROT 6.6 10/22/2021  ? ALBUMIN 4.5 08/04/2019  ? CALCIUM 9.8 10/22/2021  ? GFRAA 70 01/02/2021  ? ? ?Speciality Comments: PLQ Eye exam: 04/17/2021 WNL @ [facility]. Follow up in 1 year. ?Arava started in 2017 by Dr. Charlestine Night ?PLQ started 01/2017 ?Fosamax 2015-2018 offx 2years then resumed 2020 ? ?Procedures:  ?No procedures performed ?Allergies: Levaquin [levofloxacin] and Cephalexin  ? ?Assessment / Plan:     ?Visit Diagnoses: Rheumatoid arthritis involving multiple joints (HCC)-patient states that she has been doing well without any joint pain or joint inflammation.  She has been tolerating leflunomide and hydroxychloroquine without any side effects.  She had warmth swelling and effusion in her right knee joint.  Which she relates to left planter fasciitis.  She states that she had of left knee joint swelling few months back which was treated by a sports medicine doctor.  She states the symptoms  resolved gradually.  She has an appointment with the sports medicine doctor tomorrow.  Advised her to get synovial fluid cell count and crystal examination.  I am concerned that she is not taking her medications on a regular

## 2021-11-15 DIAGNOSIS — Z1331 Encounter for screening for depression: Secondary | ICD-10-CM | POA: Diagnosis not present

## 2021-11-15 DIAGNOSIS — Z1339 Encounter for screening examination for other mental health and behavioral disorders: Secondary | ICD-10-CM | POA: Diagnosis not present

## 2021-11-15 DIAGNOSIS — M069 Rheumatoid arthritis, unspecified: Secondary | ICD-10-CM | POA: Diagnosis not present

## 2021-11-15 DIAGNOSIS — Z0000 Encounter for general adult medical examination without abnormal findings: Secondary | ICD-10-CM | POA: Diagnosis not present

## 2021-11-15 DIAGNOSIS — M810 Age-related osteoporosis without current pathological fracture: Secondary | ICD-10-CM | POA: Diagnosis not present

## 2021-11-15 DIAGNOSIS — E785 Hyperlipidemia, unspecified: Secondary | ICD-10-CM | POA: Diagnosis not present

## 2021-11-15 DIAGNOSIS — M0510 Rheumatoid lung disease with rheumatoid arthritis of unspecified site: Secondary | ICD-10-CM | POA: Diagnosis not present

## 2021-11-26 ENCOUNTER — Encounter: Payer: Self-pay | Admitting: Rheumatology

## 2021-11-26 ENCOUNTER — Ambulatory Visit: Payer: PPO | Admitting: Rheumatology

## 2021-11-26 VITALS — BP 107/68 | HR 92 | Ht 66.5 in | Wt 126.4 lb

## 2021-11-26 DIAGNOSIS — I480 Paroxysmal atrial fibrillation: Secondary | ICD-10-CM | POA: Diagnosis not present

## 2021-11-26 DIAGNOSIS — Z79899 Other long term (current) drug therapy: Secondary | ICD-10-CM | POA: Diagnosis not present

## 2021-11-26 DIAGNOSIS — Z8639 Personal history of other endocrine, nutritional and metabolic disease: Secondary | ICD-10-CM

## 2021-11-26 DIAGNOSIS — M25461 Effusion, right knee: Secondary | ICD-10-CM

## 2021-11-26 DIAGNOSIS — M19041 Primary osteoarthritis, right hand: Secondary | ICD-10-CM | POA: Diagnosis not present

## 2021-11-26 DIAGNOSIS — M19071 Primary osteoarthritis, right ankle and foot: Secondary | ICD-10-CM

## 2021-11-26 DIAGNOSIS — M810 Age-related osteoporosis without current pathological fracture: Secondary | ICD-10-CM

## 2021-11-26 DIAGNOSIS — M170 Bilateral primary osteoarthritis of knee: Secondary | ICD-10-CM

## 2021-11-26 DIAGNOSIS — M19042 Primary osteoarthritis, left hand: Secondary | ICD-10-CM

## 2021-11-26 DIAGNOSIS — M722 Plantar fascial fibromatosis: Secondary | ICD-10-CM

## 2021-11-26 DIAGNOSIS — M19072 Primary osteoarthritis, left ankle and foot: Secondary | ICD-10-CM

## 2021-11-26 DIAGNOSIS — Z8781 Personal history of (healed) traumatic fracture: Secondary | ICD-10-CM | POA: Diagnosis not present

## 2021-11-26 DIAGNOSIS — M069 Rheumatoid arthritis, unspecified: Secondary | ICD-10-CM

## 2021-11-26 DIAGNOSIS — Z8709 Personal history of other diseases of the respiratory system: Secondary | ICD-10-CM

## 2021-11-26 NOTE — Patient Instructions (Addendum)
Standing Labs ?We placed an order today for your standing lab work.  ? ?Please have your standing labs drawn in July and every 3 months ? ?If possible, please have your labs drawn 2 weeks prior to your appointment so that the provider can discuss your results at your appointment. ? ?Please note that you may see your imaging and lab results in [place] before we have reviewed them. ?We may be awaiting multiple results to interpret others before contacting you. ?Please allow our office up to 72 hours to thoroughly review all of the results before contacting the office for clarification of your results. ? ?We have open lab daily: ?Monday through Thursday from 1:30-4:30 PM and Friday from 1:30-4:00 PM ?at the office of Dr. Bo Merino, [facility].   ?Please be advised, all patients with office appointments requiring lab work will take precedent over walk-in lab work.  ?If possible, please come for your lab work on Monday and Friday afternoons, as you may experience shorter wait times. ?The office is located at [address], [place], [place], [place] [address] ?No appointment is necessary.   ?Labs are drawn by [facility]. Please bring your co-pay at the time of your lab draw.  You may receive a bill from [facility] for your lab work. ? ?Please note if you are on Hydroxychloroquine and and an order has been placed for a Hydroxychloroquine level, you will need to have it drawn 4 hours or more after your last dose. ? ?If you wish to have your labs drawn at another location, please call the office 24 hours in advance to send orders. ? ?If you have any questions regarding directions or hours of operation,  ?please call [phone].   ?As a reminder, please drink plenty of water prior to coming for your lab work. Thanks!  ? ?Vaccines ?You are taking a medication(s) that can suppress your immune system.  The following immunizations are recommended: ?Flu annually ?Covid-19  ?Td/Tdap (tetanus, diphtheria, pertussis)  every 10 years ?Pneumonia (Prevnar 15 then Pneumovax 23 at least 1 year apart.  Alternatively, can take Prevnar 20 without needing additional dose) ?Shingrix: 2 doses from 4 weeks to 6 months apart ? ?Please check with your PCP to make sure you are up to date.  ? ?If you have signs or symptoms of an infection or start antibiotics: ?First, call your PCP for workup of your infection. ?Hold your medication through the infection, until you complete your antibiotics, and until symptoms resolve if you take the following: ?Injectable medication (Actemra, Benlysta, Cimzia, Cosentyx, Enbrel, Humira, Kevzara, Orencia, Remicade, Simponi, [place], [place], [place]) ?Methotrexate ?Leflunomide Jolee Ewing) ?Mycophenolate (Cellcept) ?Roma Kayser, or Rinvoq  ?

## 2021-11-27 ENCOUNTER — Ambulatory Visit: Payer: PPO | Admitting: Family Medicine

## 2021-12-03 ENCOUNTER — Ambulatory Visit (INDEPENDENT_AMBULATORY_CARE_PROVIDER_SITE_OTHER): Payer: PPO | Admitting: Sports Medicine

## 2021-12-03 VITALS — BP 104/64 | Ht 66.5 in | Wt 124.0 lb

## 2021-12-03 DIAGNOSIS — M25561 Pain in right knee: Secondary | ICD-10-CM

## 2021-12-04 NOTE — Progress Notes (Signed)
   Subjective:    Patient ID: [name] [name], female    DOB: 1950/09/16, 70 y.o.   MRN: [id number]  HPI chief complaint: Right knee pain  [name] [name] is a very pleasant 71 year old female that presents today complaining of right knee pain this been present since the end of March.  Her pain began shortly after she injured her left foot and ankle.  She was having to use a cane to help with ambulation.  She feels like that resulted in her having to put more pressure on her right leg and she shortly developed medial sided knee pain and swelling shortly thereafter.  Her swelling did subside about 8 days ago and as a result her pain is improving.  She denies any problems with this knee in the past.  She was last seen in the office by Dr. Oneida Alar in July of last year for a left knee injury.  She was treated conservatively with good results.  She does take an occasional extra strength Tylenol and this does help with her pain.  She has a history of rheumatoid arthritis but states that her current pain is different in nature than what she has experienced with her rheumatoid arthritis pain in the past.  No prior right knee surgery.  Intra medical history reviewed Medications reviewed Allergies reviewed    Review of Systems As above    Objective:   Physical Exam  Well-developed, well-nourished.  No acute distress  Right knee: Full range of motion.  No obvious effusion.  She is tender to palpation along the medial joint line with a positive Thessaly's.  Negative McMurray's.  No tenderness along the lateral joint line.  Knee is stable to valgus and varus stressing.  Negative anterior drawer, negative posterior drawer.  Neurovascularly intact distally.  Walking without significant limp.  Limited MSK ultrasound of the right knee was performed.  There is no effusion in the suprapatellar pouch.  There are some hypoechoic changes within the mid substance of the medial meniscus consistent with degeneration here.  There is  also a small amount of fluid surrounding the medial meniscus and some slight spurring.  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  She has no effusion on exam today.  She did well with home exercises in 2022 for her left knee injury so we will give her a refresher on those today.  She will start doing them daily.  She will continue with her extra strength Tylenol as needed.  I have also encouraged a compression sleeve when active.  I did briefly mention the possibility of a cortisone injection if symptoms do not continue to improve.  She will follow-up for ongoing or recalcitrant issues.

## 2021-12-10 ENCOUNTER — Other Ambulatory Visit: Payer: Self-pay | Admitting: Rheumatology

## 2021-12-10 DIAGNOSIS — M069 Rheumatoid arthritis, unspecified: Secondary | ICD-10-CM

## 2021-12-12 ENCOUNTER — Other Ambulatory Visit: Payer: Self-pay | Admitting: Physician Assistant

## 2021-12-12 NOTE — Telephone Encounter (Signed)
Next Visit: 04/30/2022  Last Visit: 11/26/2021  Last Fill: 09/13/2021  DX: Rheumatoid arthritis involving multiple joints   Current Dose per office note 11/26/2021: Arava 10 mg 1 tablet by mouth daily   Labs: 10/22/2021 CBC and CMP normal.  Okay to refill Arava?

## 2021-12-25 DIAGNOSIS — Z85828 Personal history of other malignant neoplasm of skin: Secondary | ICD-10-CM | POA: Diagnosis not present

## 2021-12-25 DIAGNOSIS — L814 Other melanin hyperpigmentation: Secondary | ICD-10-CM | POA: Diagnosis not present

## 2021-12-25 DIAGNOSIS — D225 Melanocytic nevi of trunk: Secondary | ICD-10-CM | POA: Diagnosis not present

## 2021-12-25 DIAGNOSIS — L821 Other seborrheic keratosis: Secondary | ICD-10-CM | POA: Diagnosis not present

## 2021-12-25 DIAGNOSIS — L578 Other skin changes due to chronic exposure to nonionizing radiation: Secondary | ICD-10-CM | POA: Diagnosis not present

## 2021-12-25 DIAGNOSIS — L570 Actinic keratosis: Secondary | ICD-10-CM | POA: Diagnosis not present

## 2021-12-25 DIAGNOSIS — Z85820 Personal history of malignant melanoma of skin: Secondary | ICD-10-CM | POA: Diagnosis not present

## 2022-01-23 ENCOUNTER — Other Ambulatory Visit: Payer: Self-pay | Admitting: Rheumatology

## 2022-01-23 DIAGNOSIS — M069 Rheumatoid arthritis, unspecified: Secondary | ICD-10-CM

## 2022-01-23 MED ORDER — HYDROXYCHLOROQUINE SULFATE 200 MG PO TABS: ORAL_TABLET | ORAL | 0 refills | Status: DC

## 2022-01-23 NOTE — Telephone Encounter (Signed)
Patient called the office requesting a refill of Plaquenil be sent to [facility] on [address]. Patient states she knows she is due for labs but would rather not go somewhere else to get labs and is waiting until Monday to stop by for them.

## 2022-01-23 NOTE — Telephone Encounter (Signed)
Next Visit: 04/30/2022  Last Visit:  11/26/2021  Labs:  10/22/2021 CBC and CMP normal.  Eye exam: 04/17/2021 WNL   Current Dose per office note 11/26/2021: Plaquenil 200 mg BID M-F  DX: Rheumatoid arthritis involving multiple joints   Last Fill: 10/23/2021  Okay to refill Plaquenil?

## 2022-02-03 DIAGNOSIS — H90A31 Mixed conductive and sensorineural hearing loss, unilateral, right ear with restricted hearing on the contralateral side: Secondary | ICD-10-CM | POA: Diagnosis not present

## 2022-02-03 DIAGNOSIS — H90A22 Sensorineural hearing loss, unilateral, left ear, with restricted hearing on the contralateral side: Secondary | ICD-10-CM | POA: Diagnosis not present

## 2022-02-27 DIAGNOSIS — R509 Fever, unspecified: Secondary | ICD-10-CM | POA: Diagnosis not present

## 2022-02-27 DIAGNOSIS — R5383 Other fatigue: Secondary | ICD-10-CM | POA: Diagnosis not present

## 2022-02-27 DIAGNOSIS — R059 Cough, unspecified: Secondary | ICD-10-CM | POA: Diagnosis not present

## 2022-02-27 DIAGNOSIS — R599 Enlarged lymph nodes, unspecified: Secondary | ICD-10-CM | POA: Diagnosis not present

## 2022-02-27 DIAGNOSIS — J069 Acute upper respiratory infection, unspecified: Secondary | ICD-10-CM | POA: Diagnosis not present

## 2022-02-27 DIAGNOSIS — Z1152 Encounter for screening for COVID-19: Secondary | ICD-10-CM | POA: Diagnosis not present

## 2022-03-04 ENCOUNTER — Telehealth: Payer: Self-pay | Admitting: Rheumatology

## 2022-03-04 DIAGNOSIS — R5383 Other fatigue: Secondary | ICD-10-CM | POA: Diagnosis not present

## 2022-03-04 DIAGNOSIS — R509 Fever, unspecified: Secondary | ICD-10-CM | POA: Diagnosis not present

## 2022-03-04 DIAGNOSIS — J189 Pneumonia, unspecified organism: Secondary | ICD-10-CM | POA: Diagnosis not present

## 2022-03-04 DIAGNOSIS — J069 Acute upper respiratory infection, unspecified: Secondary | ICD-10-CM | POA: Diagnosis not present

## 2022-03-04 DIAGNOSIS — R051 Acute cough: Secondary | ICD-10-CM | POA: Diagnosis not present

## 2022-03-04 DIAGNOSIS — M069 Rheumatoid arthritis, unspecified: Secondary | ICD-10-CM | POA: Diagnosis not present

## 2022-03-04 DIAGNOSIS — M0510 Rheumatoid lung disease with rheumatoid arthritis of unspecified site: Secondary | ICD-10-CM | POA: Diagnosis not present

## 2022-03-04 DIAGNOSIS — R599 Enlarged lymph nodes, unspecified: Secondary | ICD-10-CM | POA: Diagnosis not present

## 2022-03-04 DIAGNOSIS — Z1152 Encounter for screening for COVID-19: Secondary | ICD-10-CM | POA: Diagnosis not present

## 2022-03-04 NOTE — Telephone Encounter (Signed)
Patient called the office stating she has had a fever for a week. Patient states she had a chest x-ray and they found focal air space opacities of the right mid and upper lung suspicious of pneumonia. Patient states they are treating with antibiotics. Patient wants to know if this could be an RA flare of her lung again like when she was first diagnosed. Patient states she has an appointment with her pulmonologist sept 1. Patient states her 3 main symptoms are exhaustion, fever (99-101.1), involuntary non-productive cough. Patient requests a call back to speak with Dr. Estanislado Pandy.

## 2022-03-05 ENCOUNTER — Ambulatory Visit: Payer: PPO | Admitting: Internal Medicine

## 2022-03-05 ENCOUNTER — Encounter: Payer: Self-pay | Admitting: Internal Medicine

## 2022-03-05 ENCOUNTER — Other Ambulatory Visit: Payer: Self-pay

## 2022-03-05 VITALS — BP 125/80 | HR 96 | Temp 99.2°F | Resp 16 | Ht 66.5 in | Wt 124.0 lb

## 2022-03-05 DIAGNOSIS — Z8701 Personal history of pneumonia (recurrent): Secondary | ICD-10-CM

## 2022-03-05 NOTE — Telephone Encounter (Signed)
I returned patient's call.  I advised her that she should see her PCP for the evaluation of possible infection.  She has an appointment coming up with the pulmonologist.  The phone got disconnected.  I tried calling couple of times but there was no response.  Please explain to the patient if she returns phone call.

## 2022-03-05 NOTE — Progress Notes (Signed)
[facility]  Reason for Consult: Recurrent pneumonia/pneumonitis Referring Provider: Self-referred  Assessment: The cause of Avereigh's latest episode of pneumonia/pneumonitis remains uncertain but it has gotten better with a combination of time and empiric antibiotic therapy.  I think it is safe for her to stay off of antibiotics for now.  She will keep track of her fever and other symptoms and send me a message in the next several days to let me know how she is doing.  Plan: Observe off of antibiotics for now  Patient Active Problem List   Diagnosis Date Noted   History of pleural effusion 12/01/2016    Priority: High   History of recurrent pneumonia 12/01/2016    Priority: High   Rheumatoid arthritis involving multiple joints ([place]) 04/09/2015    Priority: High   Acute pain of left knee 01/17/2021   Primary osteoarthritis of both knees 01/08/2017   Primary osteoarthritis of both feet 01/08/2017   History of hyperlipidemia 12/01/2016   High risk medication use 12/01/2016   Trigger thumb, left thumb 12/01/2016   History of fracture of right hip 12/01/2016   Primary osteoarthritis of both hands 12/01/2016   Paroxysmal atrial fibrillation (HCC)    Tendonitis 04/03/2015   Orthostatic syncope    Syncope 03/18/2015   Osteoporosis    Hyperlipidemia 11/14/2013    Patient's Medications  New Prescriptions   No medications on file  Previous Medications   ALENDRONATE (FOSAMAX) 70 MG TABLET    Take 70 mg by mouth once a week. Take with a full glass of water on an empty stomach.   CALCIUM CARBONATE (CALTRATE 600 PO)    Take by mouth daily.   HYDROXYCHLOROQUINE (PLAQUENIL) 200 MG TABLET    TAKE 1 TABLET BY MOUTH TWICE DAILY MONDAY-FRIDAY   LEFLUNOMIDE (ARAVA) 10 MG TABLET    TAKE 1 TABLET(10 MG) BY MOUTH DAILY   ROSUVASTATIN (CRESTOR) 20 MG TABLET    Take 20 mg by mouth at bedtime.   TURMERIC 400 MG CAPS    Take 1 capsule by mouth daily.  Modified  Medications   No medications on file  Discontinued Medications   No medications on file    HPI: Shanece Cochrane is a 71 y.o. female who developed a dry cough and shortness of breath in August 2016.  She did not respond completely to multiple rounds of antibiotic therapy including azithromycin, levofloxacin and amoxicillin/clavulanate.  She was hospitalized on several occasions.  All sputum, bronchoscopy and blood culture stains and cultures were negative.  CT scan on 04/03/2015 showed a dense right middle lobe airspace disease with air bronchograms and a right pleural effusion.  Thoracentesis revealed an exudative effusion.  Around that same time she noted increasing muscle weakness with pain and swelling in her hands.  Her rheumatoid factor was elevated and her anti-CCP antibodies were positive.  She was started on Solu-Medrol and transitioned to a prednisone taper. She has been followed by rheumatology ever since and is currently on Plaquenil and [place] .  She had a routine, follow-up chest x-ray in 2018 which showed no residual pulmonary infiltrates.  In 2019 she had an episode of fever, exhaustion and a dry "involuntary cough".  Her PCP treated her with azithromycin and the symptoms gradually resolved.  A year later she had a similar episode that she managed symptomatically and improved after several days.  8 days ago she developed similar symptoms with fever, exhaustion and the same type  of dry cough.  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  She has been under pressure to finish a [employer] and has been working more than normal and very hot and humid conditions.  She did not feel well enough to go to work 1 week ago.  She was seen in her PCPs office on 02/27/2022.  She was started on azithromycin.  Her temperature went up to 101 degrees the following day.  She had a very forceful cough followed by sharp right mid back pain similar to what she had when she was hospitalized in  2016.  She called her PCP on call on 03/01/2022 and requested a chest x-ray but was told that 1 could not be ordered over the weekend.  Amoxicillin/clavulanate was added to the azithromycin.  Her pharmacy had only enough amoxicillin/clavulanate to give her 6 tablets.  She completed azithromycin and took 5 tablets of the amoxicillin/clavulanate before being seen again in her PCP office yesterday.  A chest x-ray was obtained which was reported to have shown "focal airspace opacities of the right mid and upper lung".  Over the past few days he has begun to feel better.  Her highest temperature recently was 100.2 degrees.  She is not as exhausted and thinks her dry cough has gotten a little better.  She picked up the remaining doses of her amoxicillin/clavulanate but has not started them.  She has not taken any recent acetaminophen because she did not want to suppress her fevers.  She has not had any flare in her rheumatoid arthritis symptoms.      Review of Systems: Review of Systems  Constitutional:  Positive for chills, fever, malaise/fatigue and weight loss. Negative for diaphoresis.       She says that she has lost about 2 pounds over the past week.  Respiratory:  Positive for cough and shortness of breath. Negative for sputum production and wheezing.   Cardiovascular:  Positive for chest pain.  Musculoskeletal:        She has had no change in her chronic joint pains.  Skin:  Negative for rash.      Past Medical History:  Diagnosis Date   Elevated cholesterol    Migraines    occular   Osteoporosis 2015   based on hip fracture from fall  DEXA 01/2018 T score -2.3   Pleural effusion 9/206   Bilateral effusions, s/p thora on R c/w exudate   Pneumonia 03/2015   R   RA (rheumatoid arthritis) ([place])    Skin cancer (melanoma) (HCC)    Right Arm, nose.     Social History   Tobacco Use   Smoking status: Never    Passive exposure: Past   Smokeless tobacco: Never  Vaping Use   Vaping Use:  Never used  Substance Use Topics   Alcohol use: No    Alcohol/week: 0.0 standard drinks of alcohol   Drug use: No    Family History  Problem Relation Age of Onset   Heart attack Father 33   Heart disease Father    Osteoporosis Sister    High Cholesterol Sister    Melanoma Daughter    Melanoma Daughter    Allergies  Allergen Reactions   Levaquin [Levofloxacin]     Joint pain/soreness, pain in hands, calf's.   Cephalexin Rash    OBJECTIVE: There were no vitals filed for this visit. There is no height or weight on file to calculate BMI.  Physical Exam Constitutional:      Comments: She is smiling and in very good spirits as she always is.  Cardiovascular:     Rate and Rhythm: Normal rate and regular rhythm.     Heart sounds: No murmur heard. Pulmonary:     Effort: Pulmonary effort is normal.     Breath sounds: Normal breath sounds. No wheezing, rhonchi or rales.  Musculoskeletal:     Comments: He has some chronic changes in her hands compatible with rheumatoid and osteoarthritis.  There is no acute redness or swelling.  Skin:    Findings: No rash.  Psychiatric:        Mood and Affect: Mood normal.     Microbiology: No results found for this or any previous visit (from the past 240 hour(s)).  Michel Bickers, MD [facility] [phone] pager   [phone] cell 03/05/2022, 10:58 AM

## 2022-03-06 NOTE — Telephone Encounter (Signed)
Patient states she has been seen by PCP. Patient states she was diagnosed with pneumonia. Patient states she is going to be changing PCP's after this experience. Patient states she was seen by Dr. Megan Salon at [facility]. Patient encouraged to keep her appointment with pulmonology. Patient expressed understanding.

## 2022-03-10 DIAGNOSIS — H903 Sensorineural hearing loss, bilateral: Secondary | ICD-10-CM | POA: Diagnosis not present

## 2022-03-12 ENCOUNTER — Other Ambulatory Visit: Payer: Self-pay | Admitting: Physician Assistant

## 2022-03-12 ENCOUNTER — Other Ambulatory Visit: Payer: Self-pay | Admitting: *Deleted

## 2022-03-12 DIAGNOSIS — Z79899 Other long term (current) drug therapy: Secondary | ICD-10-CM | POA: Diagnosis not present

## 2022-03-12 NOTE — Telephone Encounter (Signed)
Next Visit: 04/30/2022  Last Visit: 11/26/2021  Last Fill: 12/12/2021  DX: Rheumatoid arthritis involving multiple joints   Current Dose per office note 11/26/2021: Arava 10 mg 1 tablet by mouth daily   Labs: 10/22/2021, CBC and CMP normal.  Okay to refill Arava?

## 2022-03-12 NOTE — Telephone Encounter (Signed)
Please advise patient to have updated lab work.  Labs were due in July. Ok to provide 30-day supply of arava until she has updated lab work.

## 2022-03-12 NOTE — Telephone Encounter (Signed)
Hold sending RX until labs result, patient states she will have labs drawn on 03/12/2022.

## 2022-03-13 LAB — COMPLETE METABOLIC PANEL WITH GFR
AG Ratio: 1.6 (calc) (ref 1.0–2.5)
ALT: 14 U/L (ref 6–29)
AST: 26 U/L (ref 10–35)
Albumin: 4 g/dL (ref 3.6–5.1)
Alkaline phosphatase (APISO): 79 U/L (ref 37–153)
BUN: 21 mg/dL (ref 7–25)
CO2: 30 mmol/L (ref 20–32)
Calcium: 9.4 mg/dL (ref 8.6–10.4)
Chloride: 102 mmol/L (ref 98–110)
Creat: 0.85 mg/dL (ref 0.60–1.00)
Globulin: 2.5 g/dL (calc) (ref 1.9–3.7)
Glucose, Bld: 130 mg/dL — ABNORMAL HIGH (ref 65–99)
Potassium: 4.7 mmol/L (ref 3.5–5.3)
Sodium: 141 mmol/L (ref 135–146)
Total Bilirubin: 0.3 mg/dL (ref 0.2–1.2)
Total Protein: 6.5 g/dL (ref 6.1–8.1)
eGFR: 73 mL/min/{1.73_m2} (ref >=60)

## 2022-03-13 LAB — CBC WITH DIFFERENTIAL/PLATELET
Absolute Monocytes: 577 cells/uL (ref 200–950)
Basophils Absolute: 79 cells/uL (ref 0–200)
Basophils Relative: 1 %
Eosinophils Absolute: 237 cells/uL (ref 15–500)
Eosinophils Relative: 3 %
HCT: 38 % (ref 35.0–45.0)
Hemoglobin: 12.4 g/dL (ref 11.7–15.5)
Lymphs Abs: 1493 cells/uL (ref 850–3900)
MCH: 31.3 pg (ref 27.0–33.0)
MCHC: 32.6 g/dL (ref 32.0–36.0)
MCV: 96 fL (ref 80.0–100.0)
MPV: 9.3 fL (ref 7.5–12.5)
Monocytes Relative: 7.3 %
Neutro Abs: 5514 cells/uL (ref 1500–7800)
Neutrophils Relative %: 69.8 %
Platelets: 430 10*3/uL — ABNORMAL HIGH (ref 140–400)
RBC: 3.96 10*6/uL (ref 3.80–5.10)
RDW: 12 % (ref 11.0–15.0)
Total Lymphocyte: 18.9 %
WBC: 7.9 10*3/uL (ref 3.8–10.8)

## 2022-03-13 NOTE — Progress Notes (Signed)
Glucose is elevated, probably not a fasting sample.  Platelets are mildly elevated.  We will continue to monitor labs.  Please forward results to her PCP.

## 2022-03-13 NOTE — Telephone Encounter (Signed)
Patient updated labs 03/12/2022, Platelets 430, Glucose 130  Okay to refill Arava?

## 2022-03-14 ENCOUNTER — Encounter: Payer: Self-pay | Admitting: Pulmonary Disease

## 2022-03-14 ENCOUNTER — Ambulatory Visit: Payer: PPO | Admitting: Pulmonary Disease

## 2022-03-14 VITALS — BP 124/76 | HR 95 | Temp 98.2°F | Ht 66.5 in | Wt 124.0 lb

## 2022-03-14 DIAGNOSIS — M069 Rheumatoid arthritis, unspecified: Secondary | ICD-10-CM | POA: Diagnosis not present

## 2022-03-14 DIAGNOSIS — J479 Bronchiectasis, uncomplicated: Secondary | ICD-10-CM

## 2022-03-14 NOTE — Patient Instructions (Signed)
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

## 2022-03-14 NOTE — Progress Notes (Signed)
Synopsis: Has rheumatoid arthritis and a history of pleural effusion.  We met her for the first time in 2016 with recurrent effusion and an infiltrate with a normal bronchoscopy.  She started [place] in January 2017, started Plaquenil in 02/2017.  She had normal lung function testing in October 2018 and has minimal symptoms.  She appears to have very mildly bronchiectatic airways in the right lung. Referred in September 2023 for bronchiectasis.  She has a history of rheumatoid arthritis and is treated with Arava.  Also has a history of recurrent pneumonias and has been followed by infectious diseases, treated with several rounds of antibiotics in the summer 2023.  Subjective:   PATIENT ID: [name] [name] GENDER: female DOB: Jul 21, 1950, MRN: [id number]   HPI  Chief Complaint  Patient presents with   Consult    No c/o     [name] [name] is here to re-establish care with me for her Rheumatologic lung disease and mild bronchiectasis.  Recently she had mild dyspnea, worsening pain in her back and cough and fever.  This started on 02/25/22 after 2.5 long days in the garden.  She was diagnosed with pneumonia, was seen (urgent care?) and treated with azithromycin.  However her symptoms didn't improve and ended up on augmentin prescribed by her primary care office.  [name] [name] feels like she may have brought this on herself by overdoing it in the garden during code red days.    Between this episode and when I saw her last in 2019 she has not necessarily had flare ups.  No limitation with dyspnea. She says active with gardening in the mornigns several hours a week and she walks her dog quite a bit, maybe a little dyspnea.   She has not had flares of RA and hydroxychloroquine.    Record review: Infectious diseases visit from March 05, 2022 reviewed where the patient was seen for recurrent pneumonia.  Note was made that it was never quite certain what caused her recurrent pneumonias but with a series of antibiotics  symptoms had improved.  She was monitored off of antibiotics at that time.  Past Medical History:  Diagnosis Date   Elevated cholesterol    Migraines    occular   Osteoporosis 2015   based on hip fracture from fall  DEXA 01/2018 T score -2.3   Pleural effusion 9/206   Bilateral effusions, s/p thora on R c/w exudate   Pneumonia 03/2015   R   RA (rheumatoid arthritis) ([place])    Skin cancer (melanoma) (HCC)    Right Arm, nose.      Family History  Problem Relation Age of Onset   Heart attack Father 63   Heart disease Father    Osteoporosis Sister    High Cholesterol Sister    Melanoma Daughter    Melanoma Daughter      Social History   Socioeconomic History   Marital status: Divorced    Spouse name: Not on file   Number of children: Not on file   Years of education: Not on file   Highest education level: Not on file  Occupational History   Not on file  Tobacco Use   Smoking status: Never    Passive exposure: Past   Smokeless tobacco: Never  Vaping Use   Vaping Use: Never used  Substance and Sexual Activity   Alcohol use: No    Alcohol/week: 0.0 standard drinks of alcohol   Drug use: No   Sexual activity: Not Currently  Birth control/protection: Post-menopausal    Comment: 1st intercourse 63 yo-5 partners  Other Topics Concern   Not on file  Social History Narrative   Works for [employer] as a therapist   Not married   Two children (71 and 5- both daughters) local and she has one [place]   Social Determinants of Radio broadcast assistant Strain: Not on file  Food Insecurity: Not on file  Transportation Needs: Not on file  Physical Activity: Not on file  Stress: Not on file  Social Connections: Not on file  Intimate Partner Violence: Not on file     Allergies  Allergen Reactions   Levaquin [Levofloxacin]     Joint pain/soreness, pain in hands, calf's.   Cephalexin Rash     Outpatient Medications Prior to Visit  Medication Sig  Dispense Refill   alendronate (FOSAMAX) 70 MG tablet Take 70 mg by mouth once a week. Take with a full glass of water on an empty stomach.     Calcium Carbonate (CALTRATE 600 PO) Take by mouth daily.     hydroxychloroquine (PLAQUENIL) 200 MG tablet TAKE 1 TABLET BY MOUTH TWICE DAILY MONDAY-FRIDAY 120 tablet 0   leflunomide (ARAVA) 10 MG tablet TAKE 1 TABLET(10 MG) BY MOUTH DAILY 90 tablet 0   rosuvastatin (CRESTOR) 20 MG tablet Take 20 mg by mouth at bedtime.     Turmeric 400 MG CAPS Take 1 capsule by mouth daily.     No facility-administered medications prior to visit.    ROS Gen: Denies fever, chills, weight change, fatigue, night sweats HEENT: Denies blurred vision, double vision, hearing loss, tinnitus, sinus congestion, rhinorrhea, sore throat, neck stiffness, dysphagia PULM: per HPI CV: Denies chest pain, edema, orthopnea, paroxysmal nocturnal dyspnea, palpitations GI: Denies abdominal pain, nausea, vomiting, diarrhea, hematochezia, melena, constipation, change in bowel habits GU: Denies dysuria, hematuria, polyuria, oliguria, urethral discharge Endocrine: Denies hot or cold intolerance, polyuria, polyphagia or appetite change Derm: Denies rash, dry skin, scaling or peeling skin change Heme: Denies easy bruising, bleeding, bleeding gums Neuro: Denies headache, numbness, weakness, slurred speech, loss of memory or consciousness    Objective:  Physical Exam   Vitals:   03/14/22 1341  BP: 124/76  Pulse: 95  Temp: 98.2 F (36.8 C)  TempSrc: Oral  SpO2: 96%  Weight: 124 lb (56.2 kg)  Height: 5' 6.5" (1.689 m)    Gen: well appearing, no acute distress HENT: NCAT, OP clear, neck supple without masses Eyes: PERRL, EOMi Lymph: no cervical lymphadenopathy PULM: Wheezing in R base, otherwise clear B CV: RRR, no mgr, no JVD GI: BS+, soft, nontender, no hsm Derm: no rash or skin breakdown MSK: normal bulk and tone Neuro: A&Ox4, CN II-XII intact, strength 5/5 in all 4  extremities Psyche: normal mood and affect   CBC    Component Value Date/Time   WBC 7.9 03/12/2022 1550   RBC 3.96 03/12/2022 1550   HGB 12.4 03/12/2022 1550   HGB 13.1 08/04/2019 1057   HCT 38.0 03/12/2022 1550   HCT 39.4 08/04/2019 1057   PLT 430 (H) 03/12/2022 1550   PLT 283 08/04/2019 1057   MCV 96.0 03/12/2022 1550   MCV 96 08/04/2019 1057   MCH 31.3 03/12/2022 1550   MCHC 32.6 03/12/2022 1550   RDW 12.0 03/12/2022 1550   RDW 11.5 (L) 08/04/2019 1057   LYMPHSABS 1,493 03/12/2022 1550   LYMPHSABS 1.5 08/04/2019 1057   MONOABS 640 01/08/2017 1618   EOSABS 237 03/12/2022 1550  EOSABS 0.3 08/04/2019 1057   BASOSABS 79 03/12/2022 1550   BASOSABS 0.1 08/04/2019 1057     Chest imaging: June 2018 chest x-ray images independently reviewed showing what appears to be some mild hyperinflation but otherwise normal pulmonary parenchyma September 2016 CT chest images independently reviewed showing no emphysema, a right lower lobe infiltrate trace right-sided pleural effusion and a moderate left-sided pleural effusion with likely left lower lobe subsegmental atelectasis versus infiltrate.   Pulmonary function testing: October 2018 PFT ratio 80%, FEV1 2.6 L 102% predicted, FVC 3.24 L 99% predicted, total lung capacity 4.52 L 84% predicted, DLCO 22.20 mL 82% predicted  Labs: August 2023 hemoglobin 12.3 g/dL  Path:  Echo:  Heart Catheterization:      Assessment & Plan:   Rheumatoid arthritis involving multiple joints (HCC)  Bronchiectasis without complication ([place])  Discussion: Avryl has had a relatively uneventful interval over the last 4 years with the exception of a very recent exacerbation of her bronchiectasis.  I do not think that her disease has progressed much but its not terribly unexpected that she had this flareup after she was outside working in the hot air with poor air quality.  I do not anticipate that this will recur anytime soon but it is important for [place]  to check a lung function test and high-resolution CT scan of her chest to just make sure that none of this has progressed.  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  Immunizations: Immunization History  Administered Date(s) Administered   Influenza, High Dose Seasonal PF 04/28/2017   Influenza-Unspecified 04/23/2015   PFIZER(Purple Top)SARS-COV-2 Vaccination 08/05/2019, 08/26/2019, 04/19/2020, 11/27/2020, 05/16/2021   Pneumococcal Conjugate-13 07/29/2016   Pneumococcal Polysaccharide-23 10/09/2014   Tdap 01/08/2016     Current Outpatient Medications:    alendronate (FOSAMAX) 70 MG tablet, Take 70 mg by mouth once a week. Take with a full glass of water on an empty stomach., Disp: , Rfl:    Calcium Carbonate (CALTRATE 600 PO), Take by mouth daily., Disp: , Rfl:    hydroxychloroquine (PLAQUENIL) 200 MG tablet, TAKE 1 TABLET BY MOUTH TWICE DAILY MONDAY-FRIDAY, Disp: 120 tablet, Rfl: 0   leflunomide (ARAVA) 10 MG tablet, TAKE 1 TABLET(10 MG) BY MOUTH DAILY, Disp: 90 tablet, Rfl: 0   rosuvastatin (CRESTOR) 20 MG tablet, Take 20 mg by mouth at bedtime., Disp: , Rfl:    Turmeric 400 MG CAPS, Take 1 capsule by mouth daily., Disp: , Rfl:

## 2022-03-20 ENCOUNTER — Telehealth: Payer: Self-pay | Admitting: Pulmonary Disease

## 2022-03-20 MED ORDER — DOXYCYCLINE HYCLATE 100 MG PO TABS: 100.0000 mg | ORAL_TABLET | Freq: Two times a day (BID) | ORAL | 0 refills | Status: DC

## 2022-03-20 NOTE — Telephone Encounter (Signed)
Patient is returning phone call. Patient phone number is [phone].

## 2022-03-20 NOTE — Telephone Encounter (Signed)
Spoke with the pt She states having increased cough- non prod, fever- 101.8 today, and fatigue  She states that she is feeling just like before recent visit  She denies any night sweats, increased SOB, aches She is taking her plaquenil, arava  No new meds  She is scheduled for PFT and ov with you on 05/14/22

## 2022-03-20 NOTE — Telephone Encounter (Signed)
ATC patient.  LM to call back when available. 

## 2022-03-20 NOTE — Telephone Encounter (Signed)
Juanito Doom, MD to Me      03/20/22  5:16 PM  Recommend increase flutter valve use to 5x/hour  Doxycycline 100 mg op bid x 7 days   I have sent rx and pt aware of response per BQ Nothing further needed

## 2022-03-20 NOTE — Telephone Encounter (Signed)
Patient calling back due to symptoms reappearing as of yesterday: fever of 100.8, fatigue and cough. Patient does not feel all that bad after using Mucinex, Tylenol, and flutter value.  Please advise, call back [phone].

## 2022-03-26 DIAGNOSIS — L570 Actinic keratosis: Secondary | ICD-10-CM | POA: Diagnosis not present

## 2022-03-26 DIAGNOSIS — D225 Melanocytic nevi of trunk: Secondary | ICD-10-CM | POA: Diagnosis not present

## 2022-04-03 ENCOUNTER — Other Ambulatory Visit: Payer: Self-pay | Admitting: Rheumatology

## 2022-04-03 DIAGNOSIS — M069 Rheumatoid arthritis, unspecified: Secondary | ICD-10-CM

## 2022-04-03 NOTE — Telephone Encounter (Signed)
Reminded patient to get eye exam in October. Patient verbalized understanding.   Okay to refill plaquenil?

## 2022-04-03 NOTE — Telephone Encounter (Signed)
Please remind patient to get an eye examination in October.

## 2022-04-03 NOTE — Telephone Encounter (Signed)
Next Visit: 04/30/2022  Last Visit: 11/26/2021  Labs: 03/12/2022 Glucose is elevated, probably not a fasting sample.  Platelets are mildly elevated.  We will continue to monitor labs.   Eye exam: 04/17/2021   Current Dose per office note on 11/26/2021: Plaquenil 200 mg BID M-F.   [id number] arthritis involving multiple joints  Last Fill: 01/23/2022  Okay to refill Plaquenil?

## 2022-04-10 ENCOUNTER — Telehealth: Payer: Self-pay | Admitting: Pulmonary Disease

## 2022-04-10 DIAGNOSIS — J479 Bronchiectasis, uncomplicated: Secondary | ICD-10-CM

## 2022-04-10 DIAGNOSIS — M069 Rheumatoid arthritis, unspecified: Secondary | ICD-10-CM

## 2022-04-10 NOTE — Telephone Encounter (Signed)
ATC LVMTCB x 1   Placed order for HR CT per last OV AVS. Routing to [facility] for scheduling.

## 2022-04-10 NOTE — Telephone Encounter (Signed)
CT has been scheduled by Estill Bamberg - she called pt & gave her appt info.

## 2022-04-10 NOTE — Telephone Encounter (Signed)
Spoke with pt who states that cough and fever (highest 100.8) started yesterday. Pt denies any GI upset. Pt states cough feels more "chesty" then last sick episode. Dr. Chase Caller please advise as Dr. Lake Bells is unavailable.

## 2022-04-10 NOTE — Telephone Encounter (Signed)
Patient is returning phone call. Patient phone number is [phone].

## 2022-04-10 NOTE — Telephone Encounter (Signed)
For acute cough and fever in a 71 year old she needs to check for COVID and flu.  She has bronchiectasis so she could be having a flareup because of a virus as well.  Bronchiectasis from rheumatoid arthritis.  Plan - Consider acute visit with an urgent care or with [place] -Meanwhile she should check herself for respiratory viruses such as COVID and flu -Do standard over-the-counter treatment such as hydration, Tylenol to help the fever -If feeling miserable should go to the emergency department

## 2022-04-11 NOTE — Telephone Encounter (Signed)
Spoke with pt and reviewed Dr. Golden Pop advise. Scheduled pt for OV with Dr. Lake Bells on 04/15/22. Pt voiced understanding. Nothing further needed at this time.

## 2022-04-16 ENCOUNTER — Ambulatory Visit: Payer: PPO | Admitting: Pulmonary Disease

## 2022-04-16 NOTE — Progress Notes (Unsigned)
Office Visit Note  Patient: [name] [name]             Date of Birth: [birth year]           MRN: [id number]             PCP: Michael Boston, MD Referring: Michael Boston, MD Visit Date: 04/30/2022 Occupation: '@GUAROCC'$ @  Subjective:  Scheduled for chest CT on 05/02/22  History of Present Illness: [name] [name] is a 71 y.o. female with history of rheumatoid arthritis, osteoarthritis, and osteoporosis.  She is taking Arava 10 mg 1 tablet by mouth daily and Plaquenil 200 mg BID M-F.  She is tolerating combination therapy without any side effects.  She denies any signs or symptoms of a rheumatoid arthritis flare.  She is not experiencing any joint pain or joint swelling at this time.  She has not had any morning stiffness or nocturnal pain.  She denies any difficulty with ADLs.  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  On 04/10/2022 she reached out to [facility] due to experiencing acute cough and fever.  She is scheduled for a high-resolution chest CT on 05/02/2022.  She will be following up with Dr. Lake Bells on 05/14/2022 and will have updated pulmonary function testing at that time. She had her flu shot last week.   Activities of Daily Living:  Patient reports morning stiffness for 0 minutes.   Patient Denies nocturnal pain.  Difficulty dressing/grooming: Denies Difficulty climbing stairs: Denies Difficulty getting out of chair: Denies Difficulty using hands for taps, buttons, cutlery, and/or writing: Denies  Review of Systems  Constitutional:  Negative for fatigue.  HENT:  Negative for mouth sores and mouth dryness.   Eyes:  Negative for dryness.  Respiratory:  Positive for shortness of breath.   Cardiovascular:  Negative for chest pain and palpitations.  Gastrointestinal:  Negative for blood in stool, constipation and diarrhea.  Endocrine: Negative for increased urination.  Genitourinary:   Negative for involuntary urination.  Musculoskeletal:  Negative for joint pain, gait problem, joint pain, joint swelling, myalgias, muscle weakness, morning stiffness, muscle tenderness and myalgias.  Skin:  Negative for color change, rash, hair loss and sensitivity to sunlight.  Allergic/Immunologic: Negative for susceptible to infections.  Neurological:  Negative for dizziness and headaches.  Hematological:  Negative for swollen glands.  Psychiatric/Behavioral:  Negative for depressed mood and sleep disturbance. The patient is not nervous/anxious.     PMFS History:  Patient Active Problem List   Diagnosis Date Noted   Acute pain of left knee 01/17/2021   Primary osteoarthritis of both knees 01/08/2017   Primary osteoarthritis of both feet 01/08/2017   History of pleural effusion 12/01/2016   History of recurrent pneumonia 12/01/2016   History of hyperlipidemia 12/01/2016   High risk medication use 12/01/2016   Trigger thumb, left thumb 12/01/2016   History of fracture of right hip 12/01/2016   Primary osteoarthritis of both hands 12/01/2016   Rheumatoid arthritis involving multiple joints ([place]) 04/09/2015   Paroxysmal atrial fibrillation ([place])    Tendonitis 04/03/2015   Orthostatic syncope    Syncope 03/18/2015   Osteoporosis    Hyperlipidemia 11/14/2013    Past Medical History:  Diagnosis Date   Elevated cholesterol    Migraines    occular   Osteoporosis 2015   based on hip fracture from fall  DEXA 01/2018 T score -2.3   Pleural  effusion 9/206   Bilateral effusions, s/p thora on R c/w exudate   Pneumonia 03/2015   R   RA (rheumatoid arthritis) ([place])    Skin cancer (melanoma) (HCC)    Right Arm, nose.     Family History  Problem Relation Age of Onset   Heart attack Father 45   Heart disease Father    Osteoporosis Sister    High Cholesterol Sister    Melanoma Daughter    Melanoma Daughter    Past Surgical History:  Procedure Laterality Date   COLONOSCOPY WITH  PROPOFOL N/A 11/04/2016   Procedure: COLONOSCOPY WITH PROPOFOL;  Surgeon: Garlan Fair, MD;  Location: [facility];  Service: Endoscopy;  Laterality: N/A;   FINGER SURGERY     HIP ARTHROPLASTY Right 11/14/2013   Procedure: RIGHT CANNULATED HIP PINNING;  Surgeon: Alta Corning, MD;  Location: [facility];  Service: Orthopedics;  Laterality: Right;  BIOMET   SQUAMOUS CELL CARCINOMA EXCISION  2021   behind knee    VIDEO BRONCHOSCOPY Bilateral 04/05/2015   Procedure: VIDEO BRONCHOSCOPY WITHOUT FLUORO;  Surgeon: Juanito Doom, MD;  Location: [facility];  Service: Cardiopulmonary;  Laterality: Bilateral;   Social History   Social History Narrative   Works for [employer] as a therapist   Not married   Two children (33 and 63- both daughters) local and she has one Conservation officer, historic buildings History  Administered Date(s) Administered   Influenza, High Dose Seasonal PF 04/28/2017   Influenza-Unspecified 04/23/2015   PFIZER(Purple Top)SARS-COV-2 Vaccination 08/05/2019, 08/26/2019, 04/19/2020, 11/27/2020, 05/16/2021   Pneumococcal Conjugate-13 07/29/2016   Pneumococcal Polysaccharide-23 10/09/2014   Tdap 01/08/2016     Objective: Vital Signs: BP 117/69 (BP Location: Left Arm, Patient Position: Sitting, Cuff Size: Normal)   Pulse 86   Resp 17   Ht 5' 6.5" (1.689 m)   Wt 124 lb 3.2 oz (56.3 kg)   LMP 07/14/1993   BMI 19.75 kg/m    Physical Exam Vitals and nursing note reviewed.  Constitutional:      Appearance: She is well-developed.  HENT:     Head: Normocephalic and atraumatic.  Eyes:     Conjunctiva/sclera: Conjunctivae normal.  Cardiovascular:     Rate and Rhythm: Normal rate and regular rhythm.     Heart sounds: Normal heart sounds.  Pulmonary:     Effort: Pulmonary effort is normal.     Breath sounds: Wheezing present.  Abdominal:     General: Bowel sounds are normal.     Palpations: Abdomen is soft.  Musculoskeletal:     Cervical back: Normal range of  motion.  Skin:    General: Skin is warm and dry.     Capillary Refill: Capillary refill takes less than 2 seconds.  Neurological:     Mental Status: She is alert and oriented to person, place, and time.  Psychiatric:        Behavior: Behavior normal.      Musculoskeletal Exam: C-spine, thoracic spine, lumbar spine have good range of motion.  No midline spinal tenderness or SI joint tenderness.  Shoulder joints, elbow joints, wrist joints, MCPs, PIPs, DIPs have good range of motion with no synovitis.  PIP and DIP thickening consistent with osteoarthritis of both hands.  Complete fist formation bilaterally.  Hip joints have good range of motion with no groin pain.  Knee joints have good range of motion with no warmth or effusion.  Ankle joints have good range of motion with no tenderness or  joint swelling.  CDAI Exam: CDAI Score: -- Patient Global: 0 mm; Provider Global: 0 mm Swollen: --; Tender: -- Joint Exam 04/30/2022   No joint exam has been documented for this visit   There is currently no information documented on the homunculus. Go to the Rheumatology activity and complete the homunculus joint exam.  Investigation: No additional findings.  Imaging: No results found.  Recent Labs: Lab Results  Component Value Date   WBC 7.9 03/12/2022   HGB 12.4 03/12/2022   PLT 430 (H) 03/12/2022   NA 141 03/12/2022   K 4.7 03/12/2022   CL 102 03/12/2022   CO2 30 03/12/2022   GLUCOSE 130 (H) 03/12/2022   BUN 21 03/12/2022   CREATININE 0.85 03/12/2022   BILITOT 0.3 03/12/2022   ALKPHOS 77 08/04/2019   AST 26 03/12/2022   ALT 14 03/12/2022   PROT 6.5 03/12/2022   ALBUMIN 4.5 08/04/2019   CALCIUM 9.4 03/12/2022   GFRAA 70 01/02/2021    Speciality Comments: PLQ Eye exam: 04/17/2021 WNL @ [facility]. Follow up in 1 year. Arava started in 2017 by Dr. Lorraine Lax started 01/2017 Fosamax 2015-2018 offx 2years then resumed 2020  Procedures:  No procedures  performed Allergies: Levaquin [levofloxacin] and Cephalexin   Assessment / Plan:     Visit Diagnoses: Rheumatoid arthritis involving multiple joints ([place]): She has no joint tenderness or synovitis on examination today.  She has not had any signs or symptoms of a rheumatoid arthritis flare.  She has clinically been doing well taking Arava 20 mg 1 tablet by mouth daily and Plaquenil 200 mg 1 tablet by mouth twice daily Monday through Friday.  She is tolerating combination therapy without any side effects and has not missed any doses recently.  She has not been experiencing any morning stiffness or nocturnal pain.  No difficulty with ADLs.  She will remain on Plaquenil and Arava as combination therapy.  She was advised to notify us if she develops increased joint pain or joint swelling.  She will follow-up in the office in 5 months or sooner if needed.  High risk medication use - Arava 10 mg 1 tablet by mouth daily and Plaquenil 200 mg BID M-F.  CBC and CMP were drawn on 03/12/2022.  Her next lab work will be due in November and every 3 months to monitor for drug toxicity. Discussed the importance of holding arava if she develops signs or symptoms of an infection and to resume once the infection has completely cleared. PLQ Eye exam: 04/17/2021 WNL @ [facility].  Due to update Plaquenil eye examination.  Patient received the annual flu vaccine last week.  History of bronchiectasis - She was evaluated by Dr. Lake Bells in 2019.   Mild wheezing noted today. Scheduled for repeat high-resolution chest CT on 05/02/2022.  She is scheduled for an upcoming appointment with Dr. Lake Bells on 05/14/2022 to discuss CT results as well as update pulmonary function testing.  Primary osteoarthritis of both hands: She has PIP and DIP thickening consistent with osteoarthritis of both hands.  No inflammation noted on examination today.  She is able to make a complete fist bilaterally.  Discussed the importance of joint  protection and muscle strengthening. 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  Primary osteoarthritis of both knees: She has good range of motion of both knee joints on examination today.  No warmth or effusion noted.  Effusion, right knee: Resolved.  No recurrence.  Primary osteoarthritis of both feet:  She is not experiencing any discomfort in her feet at this time.  She is good range of motion of both ankle joints with no tenderness or synovitis.  She is wearing proper fitting shoes.  Plantar fasciitis, left: Resolved.  Osteoporosis of multiple sites -She remains on Fosamax 70 mg 1 tablet by mouth once weekly.  She is taking calcium carbonate daily.   DEXA scan October 16, 2020 showed T score of -2.6.  Due to update DEXA in April 2024.  Other medical conditions are listed as follows:  History of fracture of right hip  Paroxysmal atrial fibrillation (HCC)  History of pleural effusion - with no recurrence.  History of hyperlipidemia   Orders: No orders of the defined types were placed in this encounter.  No orders of the defined types were placed in this encounter.   Follow-Up Instructions: Return in about 5 months (around 09/29/2022) for Rheumatoid arthritis, Osteoarthritis, Osteoporosis.   Ofilia Neas, PA-C  Note - This record has been created using Dragon software.  Chart creation errors have been sought, but may not always  have been located. Such creation errors do not reflect on  the standard of medical care.

## 2022-04-25 DIAGNOSIS — Z23 Encounter for immunization: Secondary | ICD-10-CM | POA: Diagnosis not present

## 2022-04-30 ENCOUNTER — Encounter: Payer: Self-pay | Admitting: Physician Assistant

## 2022-04-30 ENCOUNTER — Ambulatory Visit: Payer: PPO | Attending: Physician Assistant | Admitting: Physician Assistant

## 2022-04-30 VITALS — BP 117/69 | HR 86 | Resp 17 | Ht 66.5 in | Wt 124.2 lb

## 2022-04-30 DIAGNOSIS — M810 Age-related osteoporosis without current pathological fracture: Secondary | ICD-10-CM

## 2022-04-30 DIAGNOSIS — M19071 Primary osteoarthritis, right ankle and foot: Secondary | ICD-10-CM | POA: Diagnosis not present

## 2022-04-30 DIAGNOSIS — M069 Rheumatoid arthritis, unspecified: Secondary | ICD-10-CM

## 2022-04-30 DIAGNOSIS — Z8709 Personal history of other diseases of the respiratory system: Secondary | ICD-10-CM

## 2022-04-30 DIAGNOSIS — M19072 Primary osteoarthritis, left ankle and foot: Secondary | ICD-10-CM

## 2022-04-30 DIAGNOSIS — I480 Paroxysmal atrial fibrillation: Secondary | ICD-10-CM | POA: Diagnosis not present

## 2022-04-30 DIAGNOSIS — M19042 Primary osteoarthritis, left hand: Secondary | ICD-10-CM

## 2022-04-30 DIAGNOSIS — M170 Bilateral primary osteoarthritis of knee: Secondary | ICD-10-CM

## 2022-04-30 DIAGNOSIS — M19041 Primary osteoarthritis, right hand: Secondary | ICD-10-CM | POA: Diagnosis not present

## 2022-04-30 DIAGNOSIS — Z79899 Other long term (current) drug therapy: Secondary | ICD-10-CM | POA: Diagnosis not present

## 2022-04-30 DIAGNOSIS — Z8781 Personal history of (healed) traumatic fracture: Secondary | ICD-10-CM

## 2022-04-30 DIAGNOSIS — M722 Plantar fascial fibromatosis: Secondary | ICD-10-CM

## 2022-04-30 DIAGNOSIS — Z8639 Personal history of other endocrine, nutritional and metabolic disease: Secondary | ICD-10-CM | POA: Diagnosis not present

## 2022-04-30 DIAGNOSIS — M25461 Effusion, right knee: Secondary | ICD-10-CM | POA: Diagnosis not present

## 2022-04-30 NOTE — Patient Instructions (Addendum)
Standing Labs We placed an order today for your standing lab work.   Please have your standing labs drawn at end of November and every 3 months   Please have your labs drawn 2 weeks prior to your appointment so that the provider can discuss your lab results at your appointment.  Please note that you may see your imaging and lab results in [place] before we have reviewed them. We will contact you once all results are reviewed. Please allow our office up to 72 hours to thoroughly review all of the results before contacting the office for clarification of your results.  Lab hours are: Monday through Thursday from 1:30 pm-4:30 pm and Friday from 1:30 pm- 4:00 pm  You may experience shorter wait times on Monday, Thursday or Friday afternoons,.   Effective May 12, 2022, new lab hours will be: Monday through Thursday from 8:00 am -12:30 pm and 1:00 pm-5:00 pm and Friday from 8:00 am-12:00 pm.  Please be advised, all patients with office appointments requiring lab work will take precedent over walk-in lab work.   Labs are drawn by [facility]. Please bring your co-pay at the time of your lab draw.  You may receive a bill from [place] for your lab work.  Please note if you are on Hydroxychloroquine and and an order has been placed for a Hydroxychloroquine level, you will need to have it drawn 4 hours or more after your last dose.  If you wish to have your labs drawn at another location, please call the office 24 hours in advance so we can fax the orders.  The office is located at [address] [place], [place], [place] [address] No appointment is necessary.    If you have any questions regarding directions or hours of operation,  please call [phone].   As a reminder, please drink plenty of water prior to coming for your lab work. Thanks!   If you have signs or symptoms of an infection or start antibiotics: First, call your PCP for workup of your infection. Hold your medication through  the infection, until you complete your antibiotics, and until symptoms resolve if you take the following: Injectable medication (Actemra, Benlysta, Cimzia, Cosentyx, Enbrel, Humira, Kevzara, Orencia, Remicade, Simponi, Stelara, Taltz, Tremfya) Methotrexate Leflunomide (Arava) Mycophenolate (Cellcept) Morrie Sheldon, Olum[place], or Rinvoq    Vaccines You are taking a medication(s) that can suppress your immune system.  The following immunizations are recommended: Flu annually Covid-19  Td/Tdap (tetanus, diphtheria, pertussis) every 10 years Pneumonia (Prevnar 15 then Pneumovax 23 at least 1 year apart.  Alternatively, can take Prevnar 20 without needing additional dose) Shingrix: 2 doses from 4 weeks to 6 months apart  Please check with your PCP to make sure you are up to date.

## 2022-05-02 ENCOUNTER — Ambulatory Visit (HOSPITAL_BASED_OUTPATIENT_CLINIC_OR_DEPARTMENT_OTHER)
Admission: RE | Admit: 2022-05-02 | Discharge: 2022-05-02 | Disposition: A | Discharge status: Home / Self Care | Payer: PPO | Source: Ambulatory Visit | Attending: Pulmonary Disease | Admitting: Pulmonary Disease

## 2022-05-02 DIAGNOSIS — M069 Rheumatoid arthritis, unspecified: Secondary | ICD-10-CM

## 2022-05-02 DIAGNOSIS — J479 Bronchiectasis, uncomplicated: Secondary | ICD-10-CM

## 2022-05-08 ENCOUNTER — Other Ambulatory Visit (HOSPITAL_BASED_OUTPATIENT_CLINIC_OR_DEPARTMENT_OTHER): Payer: PPO

## 2022-05-13 ENCOUNTER — Other Ambulatory Visit: Payer: Self-pay

## 2022-05-13 DIAGNOSIS — J479 Bronchiectasis, uncomplicated: Secondary | ICD-10-CM

## 2022-05-14 ENCOUNTER — Ambulatory Visit (INDEPENDENT_AMBULATORY_CARE_PROVIDER_SITE_OTHER): Payer: PPO | Admitting: Pulmonary Disease

## 2022-05-14 ENCOUNTER — Ambulatory Visit: Payer: PPO | Admitting: Pulmonary Disease

## 2022-05-14 ENCOUNTER — Encounter: Payer: Self-pay | Admitting: Pulmonary Disease

## 2022-05-14 VITALS — BP 132/84 | HR 96 | Ht 65.5 in | Wt 122.8 lb

## 2022-05-14 DIAGNOSIS — J479 Bronchiectasis, uncomplicated: Secondary | ICD-10-CM

## 2022-05-14 LAB — PULMONARY FUNCTION TEST
DL/VA % pred: 96 %
DL/VA: 3.93 ml/min/mmHg/L
DLCO cor % pred: 87 %
DLCO cor: 18.03 ml/min/mmHg
DLCO unc % pred: 87 %
DLCO unc: 18.03 ml/min/mmHg
FEF 25-75 Post: 3.35 L/sec
FEF 25-75 Pre: 2.97 L/sec
FEF2575-%Change-Post: 13 %
FEF2575-%Pred-Post: 174 %
FEF2575-%Pred-Pre: 154 %
FEV1-%Change-Post: 2 %
FEV1-%Pred-Post: 112 %
FEV1-%Pred-Pre: 110 %
FEV1-Post: 2.7 L
FEV1-Pre: 2.64 L
FEV1FVC-%Change-Post: 6 %
FEV1FVC-%Pred-Pre: 111 %
FEV6-%Change-Post: -3 %
FEV6-%Pred-Post: 99 %
FEV6-%Pred-Pre: 103 %
FEV6-Post: 3.01 L
FEV6-Pre: 3.13 L
FEV6FVC-%Pred-Post: 104 %
FEV6FVC-%Pred-Pre: 104 %
FVC-%Change-Post: -3 %
FVC-%Pred-Post: 94 %
FVC-%Pred-Pre: 98 %
FVC-Post: 3.01 L
FVC-Pre: 3.13 L
Post FEV1/FVC ratio: 90 %
Post FEV6/FVC ratio: 100 %
Pre FEV1/FVC ratio: 85 %
Pre FEV6/FVC Ratio: 100 %
RV % pred: 145 %
RV: 3.39 L
TLC % pred: 116 %
TLC: 6.22 L

## 2022-05-14 NOTE — Patient Instructions (Signed)
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

## 2022-05-14 NOTE — Progress Notes (Signed)
PFT done today. 

## 2022-05-14 NOTE — Progress Notes (Signed)
Synopsis: Referred in September 2023 for bronchiectasis.  She has a history of rheumatoid arthritis and is treated with Arava.  Also has a history of recurrent pneumonias and has been followed by infectious diseases, treated with several rounds of antibiotics in the summer 2023.   Subjective:   PATIENT ID: [name] [name] GENDER: female DOB: 07/02/1951, MRN: [id number]   HPI  Chief Complaint  Patient presents with   Follow-up    [name] [name] says that she is feeling better in general compared to the last visit.  She has not had the cycles of fever, exhaustion, congestion. She currently has some degree of chest congestion now, produced a small amount of yellow phlegm yesterday.  Her cough has picked up a little in the last few days.   Past Medical History:  Diagnosis Date   Elevated cholesterol    Migraines    occular   Osteoporosis 2015   based on hip fracture from fall  DEXA 01/2018 T score -2.3   Pleural effusion 9/206   Bilateral effusions, s/p thora on R c/w exudate   Pneumonia 03/2015   R   RA (rheumatoid arthritis) ([place])    Skin cancer (melanoma) (HCC)    Right Arm, nose.        Review of Systems  Constitutional:  Negative for diaphoresis, fever and weight loss.  HENT:  Negative for nosebleeds, sinus pain, sore throat and tinnitus.   Respiratory:  Negative for cough, sputum production and shortness of breath.   Cardiovascular:  Negative for chest pain, claudication and leg swelling.       Objective:  Physical Exam   Vitals:   05/14/22 1257  BP: 132/84  Pulse: 96  SpO2: 96%  Weight: 122 lb 12.8 oz (55.7 kg)  Height: 5' 5.5" (1.664 m)    General:  Resting comfortably in bed HENT: NCAT OP clear PULM: CTA B, normal effort CV: RRR, no mgr GI: BS+, soft, nontender MSK: normal bulk and tone Neuro: awake, alert, no distress, MAEW    CBC    Component Value Date/Time   WBC 7.9 03/12/2022 1550   RBC 3.96 03/12/2022 1550   HGB 12.4 03/12/2022 1550   HGB 13.1  08/04/2019 1057   HCT 38.0 03/12/2022 1550   HCT 39.4 08/04/2019 1057   PLT 430 (H) 03/12/2022 1550   PLT 283 08/04/2019 1057   MCV 96.0 03/12/2022 1550   MCV 96 08/04/2019 1057   MCH 31.3 03/12/2022 1550   MCHC 32.6 03/12/2022 1550   RDW 12.0 03/12/2022 1550   RDW 11.5 (L) 08/04/2019 1057   LYMPHSABS 1,493 03/12/2022 1550   LYMPHSABS 1.5 08/04/2019 1057   MONOABS 640 01/08/2017 1618   EOSABS 237 03/12/2022 1550   EOSABS 0.3 08/04/2019 1057   BASOSABS 79 03/12/2022 1550   BASOSABS 0.1 08/04/2019 1057     Chest imaging: June 2018 chest x-ray images independently reviewed showing what appears to be some mild hyperinflation but otherwise normal pulmonary parenchyma September 2016 CT chest images independently reviewed showing no emphysema, a right lower lobe infiltrate trace right-sided pleural effusion and a moderate left-sided pleural effusion with likely left lower lobe subsegmental atelectasis versus infiltrate. October 2023 high-resolution CT chest images independently reviewed showing cylindrical bronchiectasis bilaterally with patchy consolidation/scarring.  Patches of groundglass.  Mucous plugging and subsegmental atelectasis in some areas.   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  I see no evidence of progression based on imaging or lung function testing this year.  She is at risk for respiratory infections.  We talked about mitigation strategies today.  Plan: Mild  cylindrical bronchiectasis related to rheumatoid arthritis: Use a flutter valve 4 to 5 breaths, 4-5 times a day in the event of increasing chest congestion, mucus production or shortness of breath If this does not help relieve symptoms then call me so I can call in an antibiotic Practice good hand hygiene Stay physically active I am glad you have had your flu shot Get a COVID booster  I will see you back in 1 year or sooner if needed  Immunizations: Immunization History  Administered Date(s) Administered   Influenza, High Dose Seasonal PF 04/28/2017, 04/28/2022   Influenza-Unspecified 04/23/2015   PFIZER(Purple Top)SARS-COV-2 Vaccination 08/05/2019, 08/26/2019, 04/19/2020, 11/27/2020, 05/16/2021   Pneumococcal Conjugate-13 07/29/2016   Pneumococcal Polysaccharide-23 10/09/2014   Tdap 01/08/2016     Current Outpatient Medications:    alendronate (FOSAMAX) 70 MG tablet, Take 70 mg by mouth once a week. Take with a full glass of water on an empty stomach., Disp: , Rfl:    Calcium Carbonate (CALTRATE 600 PO), Take by mouth daily., Disp: , Rfl:    hydroxychloroquine (PLAQUENIL) 200 MG tablet, TAKE 1 TABLET BY MOUTH TWICE DAILY ON MONDAY-FRIDAY, Disp: 120 tablet, Rfl: 0   leflunomide (ARAVA) 10 MG tablet, TAKE 1 TABLET(10 MG) BY MOUTH DAILY, Disp: 90 tablet, Rfl: 0   rosuvastatin (CRESTOR) 20 MG tablet, Take 20 mg by mouth at bedtime., Disp: , Rfl:    Turmeric 400 MG CAPS, Take 1 capsule by mouth daily., Disp: , Rfl:

## 2022-05-22 DIAGNOSIS — Z79899 Other long term (current) drug therapy: Secondary | ICD-10-CM | POA: Diagnosis not present

## 2022-05-22 DIAGNOSIS — H524 Presbyopia: Secondary | ICD-10-CM | POA: Diagnosis not present

## 2022-06-10 ENCOUNTER — Ambulatory Visit: Payer: PPO | Admitting: Sports Medicine

## 2022-06-10 ENCOUNTER — Other Ambulatory Visit: Payer: Self-pay | Admitting: Physician Assistant

## 2022-06-10 VITALS — BP 97/40 | Ht 66.0 in | Wt 120.0 lb

## 2022-06-10 DIAGNOSIS — M25562 Pain in left knee: Secondary | ICD-10-CM

## 2022-06-10 DIAGNOSIS — Z1231 Encounter for screening mammogram for malignant neoplasm of breast: Secondary | ICD-10-CM | POA: Diagnosis not present

## 2022-06-10 DIAGNOSIS — M79651 Pain in right thigh: Secondary | ICD-10-CM | POA: Diagnosis not present

## 2022-06-10 NOTE — Progress Notes (Signed)
   Subjective:    Patient ID: [name] [name], female    DOB: 1950-09-01, 71 y.o.   MRN: [id number]  HPI chief complaint: Right thigh and left knee pain  [name] [name] presents today with a couple of different complaints.  Main complaint is right thigh pain that began without any injury 2 to 3 weeks ago.  She believes that her pain may have began after jumping on a trampoline with her grandkids.  2 to 3 days later she began to experience discomfort that she localizes to the distal thigh just above the knee.  Her pain was much worse with hip extension and much improved with hip flexion to the point that she was having to walk bent over.  She has a history of an ORIF in the same hip in 2015 and experienced similar pain postoperatively which was thought to be the result of nerve entrapment.  That pain eventually resolved but she does state that it feels very similar.  She denies any pain in the groin.  She has not noticed any swelling.  She has taken both Tylenol and Aleve and her symptoms have improved.  She is also having some left knee pain.  She has a documented history of left knee DJD and a degenerative lateral meniscal tear seen on ultrasound by Dr. Oneida Alar in 2022.  Her symptoms improved at that time with home exercises and a compression sleeve.  She feels like her knee pain may be the consequence of compensation from her right thigh pain.  Again, no trauma.  She has resumed wearing her compression sleeve but is no longer doing the exercises.    Review of Systems As above    Objective:   Physical Exam  Well-developed, well-nourished.  No acute distress  Right hip: Smooth painless hip range of motion with a negative logroll.  There is no tenderness to palpation around the hip nor along the femur.  No pain with resisted hip flexion.  No pain with resisted knee extension.  Left knee: Good range of motion.  No obvious effusion.  There is some tenderness to palpation along the lateral joint line with pain  but no popping with McMurray's.  Knee is grossly stable ligamentous exam.  Neurovascularly intact distally.      Assessment & Plan:   Right thigh pain possibly secondary to neuropathic pain Left knee pain secondary to DJD and lateral meniscal tear  Although the right thigh pain is not straightforward, her exam today is reassuring.  Since she thinks this is similar to the neuropathic pain she experienced postoperatively in 2015 I recommended that we take a watchful waiting approach for now since it does seem to be improving.  She may continue with her Tylenol or her over-the-counter Aleve as needed since they are helpful.  For the left knee she will resume her isometric quad, isometric lateral hip, and quad set exercises and will do them daily.  Continue with compression as well.  Follow-up in 3 weeks for reevaluation of both the right thigh and left knee.  Call with questions or concerns in the interim.  This note was dictated using Dragon naturally speaking software and may contain errors in syntax, spelling, or content which have not been identified prior to signing this note.

## 2022-06-10 NOTE — Telephone Encounter (Signed)
Next Visit: 11/04/2022  Last Visit: 04/30/2022  Last Fill: 03/13/2022  DX: Rheumatoid arthritis involving multiple joints   Current Dose per office note 04/30/2022: Arava 10 mg 1 tablet by mouth daily   Labs: 03/12/2022 Glucose is elevated, probably not a fasting sample.  Platelets are mildly elevated.  We will continue to monitor labs.   Patient advised she is due to update labs.   Okay to refill Arava?

## 2022-06-12 ENCOUNTER — Other Ambulatory Visit: Payer: Self-pay | Admitting: *Deleted

## 2022-06-12 DIAGNOSIS — Z79899 Other long term (current) drug therapy: Secondary | ICD-10-CM

## 2022-06-13 LAB — COMPLETE METABOLIC PANEL WITH GFR
AG Ratio: 1.5 (calc) (ref 1.0–2.5)
ALT: 12 U/L (ref 6–29)
AST: 21 U/L (ref 10–35)
Albumin: 4 g/dL (ref 3.6–5.1)
Alkaline phosphatase (APISO): 84 U/L (ref 37–153)
BUN: 21 mg/dL (ref 7–25)
CO2: 29 mmol/L (ref 20–32)
Calcium: 9.8 mg/dL (ref 8.6–10.4)
Chloride: 103 mmol/L (ref 98–110)
Creat: 0.81 mg/dL (ref 0.60–1.00)
Globulin: 2.7 g/dL (calc) (ref 1.9–3.7)
Glucose, Bld: 100 mg/dL — ABNORMAL HIGH (ref 65–99)
Potassium: 4.4 mmol/L (ref 3.5–5.3)
Sodium: 141 mmol/L (ref 135–146)
Total Bilirubin: 0.3 mg/dL (ref 0.2–1.2)
Total Protein: 6.7 g/dL (ref 6.1–8.1)
eGFR: 77 mL/min/{1.73_m2} (ref >=60)

## 2022-06-13 LAB — CBC WITH DIFFERENTIAL/PLATELET
Absolute Monocytes: 731 cells/uL (ref 200–950)
Basophils Absolute: 97 cells/uL (ref 0–200)
Basophils Relative: 1.4 %
Eosinophils Absolute: 731 cells/uL — ABNORMAL HIGH (ref 15–500)
Eosinophils Relative: 10.6 %
HCT: 36.6 % (ref 35.0–45.0)
Hemoglobin: 12.1 g/dL (ref 11.7–15.5)
Lymphs Abs: 1911 cells/uL (ref 850–3900)
MCH: 30.9 pg (ref 27.0–33.0)
MCHC: 33.1 g/dL (ref 32.0–36.0)
MCV: 93.6 fL (ref 80.0–100.0)
MPV: 10.4 fL (ref 7.5–12.5)
Monocytes Relative: 10.6 %
Neutro Abs: 3429 cells/uL (ref 1500–7800)
Neutrophils Relative %: 49.7 %
Platelets: 284 10*3/uL (ref 140–400)
RBC: 3.91 10*6/uL (ref 3.80–5.10)
RDW: 12.6 % (ref 11.0–15.0)
Total Lymphocyte: 27.7 %
WBC: 6.9 10*3/uL (ref 3.8–10.8)

## 2022-07-01 ENCOUNTER — Ambulatory Visit: Payer: PPO | Admitting: Sports Medicine

## 2022-07-01 VITALS — BP 110/68 | Ht 66.0 in | Wt 120.0 lb

## 2022-07-01 DIAGNOSIS — M25562 Pain in left knee: Secondary | ICD-10-CM | POA: Diagnosis not present

## 2022-07-01 NOTE — Progress Notes (Signed)
   Subjective:    Patient ID: Renard Hamper, female    DOB: [birth year], 71 y.o.   MRN: [id number]  HPI  Anaysha presents today for follow-up on right thigh and left knee pain.  Right thigh pain has improved.  She has not had any more issues with it since her last office visit with me 3 weeks ago.  Left knee pain has improved but has not resolved.  She admits that she has not been quite as diligent about doing her home exercises that she should.  Symptoms are most noticeable at night.  Pain is primarily along the lateral knee where she has a documented history of a lateral meniscus tear.  No swelling.  Pain is not severe enough that she needs to take medicine.    Review of Systems As above    Objective:   Physical Exam  Left knee: Good range of motion but she does have a 2 to 3 degree extension lag.  No effusion.  She is tender to palpation along the lateral joint line with pain but no popping with McMurray's laterally.  No tenderness along the medial joint line.  Knee is grossly stable to ligamentous exam.      Assessment & Plan:   Resolved right thigh pain Left knee pain likely secondary to lateral meniscus tear versus DJD  Jennier will continue with her home exercises.  She will also continue wearing her compression sleeve when active.  I did discuss the possibility of a cortisone injection if symptoms persist or worsen but her pain is not severe enough today that we need to consider that.  She may increase activity as tolerated and will follow-up with me for ongoing or recalcitrant issues.  This note was dictated using Dragon naturally speaking software and may contain errors in syntax, spelling, or content which have not been identified prior to signing this note.

## 2022-07-10 ENCOUNTER — Other Ambulatory Visit: Payer: Self-pay | Admitting: Physician Assistant

## 2022-07-10 NOTE — Telephone Encounter (Signed)
Next Visit: 11/04/2022   Last Visit: 04/30/2022   Last Fill: 06/10/2022 (30 day supply)   DX: Rheumatoid arthritis involving multiple joints    Current Dose per office note 04/30/2022: Arava 10 mg 1 tablet by mouth daily    Labs: 06/12/2022 CMP WNL. Absolute eosinophils are borderline elevated. Rest of CBC WNL.  No medication changes recommended at this time.    Okay to refill Arava?

## 2022-07-24 ENCOUNTER — Other Ambulatory Visit: Payer: Self-pay | Admitting: Rheumatology

## 2022-07-24 DIAGNOSIS — M069 Rheumatoid arthritis, unspecified: Secondary | ICD-10-CM

## 2022-07-24 NOTE — Telephone Encounter (Signed)
Next Visit: 11/04/2022   Last Visit: 04/30/2022   Last Fill: 07/10/2022   DX: Rheumatoid arthritis involving multiple joints    Current Dose per office note 04/30/2022:  Labs: 06/12/2022 CMP WNL. Absolute eosinophils are borderline elevated. Rest of CBC WNL.   PLQ Eye exam: 05/22/2022 WNL   Okay to refill PLQ?

## 2022-09-09 DIAGNOSIS — H532 Diplopia: Secondary | ICD-10-CM | POA: Diagnosis not present

## 2022-09-29 ENCOUNTER — Other Ambulatory Visit: Payer: Self-pay | Admitting: Physician Assistant

## 2022-09-29 DIAGNOSIS — M069 Rheumatoid arthritis, unspecified: Secondary | ICD-10-CM

## 2022-09-29 NOTE — Telephone Encounter (Signed)
Next Visit: 11/04/2022  Last Visit: 04/30/2022  Labs: 06/12/2022 CMP WNL. Absolute eosinophils are borderline elevated. Rest of CBC WNL.   Eye exam: 05/22/2022 WNL    Current Dose per office note 04/30/2022: Plaquenil 200 mg BID M-F   DX: Rheumatoid arthritis involving multiple joints   Last Fill: 07/24/2022  Okay to refill Plaquenil?

## 2022-10-01 ENCOUNTER — Ambulatory Visit: Payer: PPO | Admitting: Rheumatology

## 2022-10-15 DIAGNOSIS — C44629 Squamous cell carcinoma of skin of left upper limb, including shoulder: Secondary | ICD-10-CM | POA: Diagnosis not present

## 2022-10-15 DIAGNOSIS — L570 Actinic keratosis: Secondary | ICD-10-CM | POA: Diagnosis not present

## 2022-10-15 DIAGNOSIS — D485 Neoplasm of uncertain behavior of skin: Secondary | ICD-10-CM | POA: Diagnosis not present

## 2022-10-15 DIAGNOSIS — L821 Other seborrheic keratosis: Secondary | ICD-10-CM | POA: Diagnosis not present

## 2022-10-15 DIAGNOSIS — D225 Melanocytic nevi of trunk: Secondary | ICD-10-CM | POA: Diagnosis not present

## 2022-10-15 DIAGNOSIS — L578 Other skin changes due to chronic exposure to nonionizing radiation: Secondary | ICD-10-CM | POA: Diagnosis not present

## 2022-10-15 DIAGNOSIS — Z85820 Personal history of malignant melanoma of skin: Secondary | ICD-10-CM | POA: Diagnosis not present

## 2022-10-15 DIAGNOSIS — L814 Other melanin hyperpigmentation: Secondary | ICD-10-CM | POA: Diagnosis not present

## 2022-10-15 DIAGNOSIS — Z85828 Personal history of other malignant neoplasm of skin: Secondary | ICD-10-CM | POA: Diagnosis not present

## 2022-11-04 ENCOUNTER — Ambulatory Visit: Payer: PPO | Admitting: Rheumatology

## 2022-11-12 HISTORY — PX: SQUAMOUS CELL CARCINOMA EXCISION: SHX2433

## 2022-11-17 ENCOUNTER — Other Ambulatory Visit: Payer: Self-pay | Admitting: *Deleted

## 2022-11-17 ENCOUNTER — Encounter: Payer: Self-pay | Admitting: *Deleted

## 2022-11-17 DIAGNOSIS — M792 Neuralgia and neuritis, unspecified: Secondary | ICD-10-CM

## 2022-11-19 DIAGNOSIS — C44629 Squamous cell carcinoma of skin of left upper limb, including shoulder: Secondary | ICD-10-CM | POA: Diagnosis not present

## 2022-11-21 ENCOUNTER — Other Ambulatory Visit: Payer: Self-pay | Admitting: *Deleted

## 2022-11-21 ENCOUNTER — Other Ambulatory Visit: Payer: Self-pay | Admitting: Physician Assistant

## 2022-11-21 DIAGNOSIS — Z79899 Other long term (current) drug therapy: Secondary | ICD-10-CM

## 2022-11-21 DIAGNOSIS — M069 Rheumatoid arthritis, unspecified: Secondary | ICD-10-CM

## 2022-11-21 MED ORDER — LEFLUNOMIDE 10 MG PO TABS: ORAL_TABLET | ORAL | 0 refills | Status: DC

## 2022-11-21 NOTE — Telephone Encounter (Signed)
I called patient, patient will have labs drawn at PCP 12/02/2022. Lab order faxed to PCP per patient's request.

## 2022-11-21 NOTE — Telephone Encounter (Signed)
Last Fill: 07/10/2022  Labs: 06/12/2022 CMP WNL. Absolute eosinophils are borderline elevated. Rest of CBC WNL.  No medication changes recommended at this time. [facility] labs are due.  Next Visit: 12/12/2022  Last Visit: 04/30/2022  DX: Rheumatoid arthritis involving multiple joints   Current Dose per office note 04/30/2022: Arava 10 mg 1 tablet by mouth daily   Okay to refill Arava ?

## 2022-11-21 NOTE — Addendum Note (Signed)
Addended by: Ellen Henri on: 11/21/2022 08:07 AM   Modules accepted: Orders

## 2022-11-26 DIAGNOSIS — E785 Hyperlipidemia, unspecified: Secondary | ICD-10-CM | POA: Diagnosis not present

## 2022-11-26 DIAGNOSIS — R7989 Other specified abnormal findings of blood chemistry: Secondary | ICD-10-CM | POA: Diagnosis not present

## 2022-11-26 DIAGNOSIS — M810 Age-related osteoporosis without current pathological fracture: Secondary | ICD-10-CM | POA: Diagnosis not present

## 2022-11-26 LAB — COMPLETE METABOLIC PANEL WITH GFR: EGFR: 61.4

## 2022-11-28 NOTE — Progress Notes (Signed)
Office Visit Note  Patient: [name] [name]             Date of Birth: 12-06-1950           MRN: [id number]             PCP: Melida Quitter, MD Referring: Melida Quitter, MD Visit Date: 12/12/2022 Occupation: @GUAROCC @  Subjective:  Medication management  History of Present Illness: [name] [name] is a 72 y.o. female with history of rheumatoid arthritis, recent osteoporosis.  Patient denies any joint pain or joint swelling.  She has been taking hydroxychloroquine 200 mg p.o. twice daily Monday to Friday and leflunomide 10 mg p.o. daily on a regular basis without any interruption.  She denies any infections since the last visit.  She has been followed by pulmonologist on a yearly basis.  She is a still on Fosamax 70 mg p.o. weekly.  Patient states she does not remember to take Fosamax on a regular basis.  Her DEXA scans coming up.    Activities of Daily Living:  Patient reports morning stiffness for 0 minutes.   Patient Denies nocturnal pain.  Difficulty dressing/grooming: Denies Difficulty climbing stairs: Denies Difficulty getting out of chair: Denies Difficulty using hands for taps, buttons, cutlery, and/or writing: Denies  Review of Systems  Constitutional:  Negative for fatigue.  HENT:  Negative for mouth sores and mouth dryness.   Eyes:  Negative for dryness.  Respiratory:  Negative for shortness of breath.   Cardiovascular:  Negative for chest pain and palpitations.  Gastrointestinal:  Negative for blood in stool, constipation and diarrhea.  Endocrine: Negative for increased urination.  Genitourinary:  Negative for involuntary urination.  Musculoskeletal:  Negative for joint pain, gait problem, joint pain, joint swelling, myalgias, muscle weakness, morning stiffness, muscle tenderness and myalgias.  Skin:  Negative for color change, rash, hair loss and sensitivity to sunlight.  Allergic/Immunologic: Negative for susceptible to infections.  Neurological:  Negative for dizziness and  headaches.  Hematological:  Negative for swollen glands.  Psychiatric/Behavioral:  Negative for depressed mood and sleep disturbance. The patient is not nervous/anxious.     PMFS History:  Patient Active Problem List   Diagnosis Date Noted   Acute pain of left knee 01/17/2021   Primary osteoarthritis of both knees 01/08/2017   Primary osteoarthritis of both feet 01/08/2017   History of pleural effusion 12/01/2016   History of recurrent pneumonia 12/01/2016   History of hyperlipidemia 12/01/2016   High risk medication use 12/01/2016   Trigger thumb, left thumb 12/01/2016   History of fracture of right hip 12/01/2016   Primary osteoarthritis of both hands 12/01/2016   Rheumatoid arthritis involving multiple joints (HCC) 04/09/2015   Paroxysmal atrial fibrillation (HCC)    Tendonitis 04/03/2015   Orthostatic syncope    Syncope 03/18/2015   Osteoporosis    Hyperlipidemia 11/14/2013    Past Medical History:  Diagnosis Date   Elevated cholesterol    Migraines    occular   Osteoporosis 2015   based on hip fracture from fall  DEXA 01/2018 T score -2.3   Pleural effusion 9/206   Bilateral effusions, s/p thora on R c/w exudate   Pneumonia 03/2015   R   RA (rheumatoid arthritis) (HCC)    Skin cancer (melanoma) (HCC)    Right Arm, nose.     Family History  Problem Relation Age of Onset   Heart attack Father 13   Heart disease Father    Osteoporosis Sister  High Cholesterol Sister    Melanoma Daughter    Melanoma Daughter    Past Surgical History:  Procedure Laterality Date   COLONOSCOPY WITH PROPOFOL N/A 11/04/2016   Procedure: COLONOSCOPY WITH PROPOFOL;  Surgeon: Charolett Bumpers, MD;  Location: [facility];  Service: Endoscopy;  Laterality: N/A;   FINGER SURGERY     HIP ARTHROPLASTY Right 11/14/2013   Procedure: RIGHT CANNULATED HIP PINNING;  Surgeon: Harvie Junior, MD;  Location: [facility];  Service: Orthopedics;  Laterality: Right;  BIOMET   SQUAMOUS CELL CARCINOMA  EXCISION  2021   behind knee    SQUAMOUS CELL CARCINOMA EXCISION Left 11/2022   left hand   VIDEO BRONCHOSCOPY Bilateral 04/05/2015   Procedure: VIDEO BRONCHOSCOPY WITHOUT FLUORO;  Surgeon: Lupita Leash, MD;  Location: [facility];  Service: Cardiopulmonary;  Laterality: Bilateral;   Social History   Social History Narrative   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   Two children (32 and 44- both daughters) local and she has one Arboriculturist History  Administered Date(s) Administered   Influenza, High Dose Seasonal PF 04/28/2017, 04/28/2022   Influenza-Unspecified 04/23/2015   PFIZER(Purple Top)SARS-COV-2 Vaccination 08/05/2019, 08/26/2019, 04/19/2020, 11/27/2020, 05/16/2021   Pneumococcal Conjugate-13 07/29/2016   Pneumococcal Polysaccharide-23 10/09/2014   Tdap 01/08/2016     Objective: Vital Signs: BP 116/70 (BP Location: Left Arm, Patient Position: Sitting, Cuff Size: Normal)   Pulse 66   Resp 13   Ht 5' 6.5" (1.689 m)   Wt 126 lb 9.6 oz (57.4 kg)   LMP 07/14/1993   BMI 20.13 kg/m    Physical Exam Vitals and nursing note reviewed.  Constitutional:      Appearance: She is well-developed.  HENT:     Head: Normocephalic and atraumatic.  Eyes:     Conjunctiva/sclera: Conjunctivae normal.  Cardiovascular:     Rate and Rhythm: Normal rate and regular rhythm.     Heart sounds: Normal heart sounds.  Pulmonary:     Effort: Pulmonary effort is normal.     Breath sounds: Normal breath sounds.  Abdominal:     General: Bowel sounds are normal.     Palpations: Abdomen is soft.  Musculoskeletal:     Cervical back: Normal range of motion.  Lymphadenopathy:     Cervical: No cervical adenopathy.  Skin:    General: Skin is warm and dry.     Capillary Refill: Capillary refill takes less than 2 seconds.  Neurological:     Mental Status: She is alert and oriented to person, place, and time.  Psychiatric:        Behavior: Behavior  normal.      Musculoskeletal Exam: Cervical, thoracic and lumbar spine were in good range of motion.  Shoulder joints, elbow joints, wrist joints, MCPs PIPs and DIPs were in good range of motion with no synovitis.  Bilateral PIP and DIP thickening was noted.  Hip joints and knee joints were in good range of motion.  No warmth swelling or effusion was noted.  There was no tenderness over ankles or MTPs.  CDAI Exam: CDAI Score: -- Patient Global: 0 mm; Provider Global: 0 mm Swollen: --; Tender: -- Joint Exam 12/12/2022   No joint exam has been documented for this visit   There is currently no information documented on the homunculus. Go to the Rheumatology activity and complete the homunculus joint exam.  Investigation: No additional findings.  Imaging: No results found.  Recent Labs:  Lab Results  Component Value Date   WBC 6.9 06/12/2022   HGB 12.1 06/12/2022   PLT 284 06/12/2022   NA 141 06/12/2022   K 4.4 06/12/2022   CL 103 06/12/2022   CO2 29 06/12/2022   GLUCOSE 100 (H) 06/12/2022   BUN 21 06/12/2022   CREATININE 0.81 06/12/2022   BILITOT 0.3 06/12/2022   ALKPHOS 77 08/04/2019   AST 21 06/12/2022   ALT 12 06/12/2022   PROT 6.7 06/12/2022   ALBUMIN 4.5 08/04/2019   CALCIUM 9.8 06/12/2022   GFRAA 70 01/02/2021     Speciality Comments: PLQ Eye exam: 05/22/2022 WNL @ [facility].  Arava started in 2017 by Dr. Dedra Skeens started 01/2017 Fosamax 2015-2018 offx 2years then resumed 2020  Procedures:  No procedures performed Allergies: Levaquin [levofloxacin] and Cephalexin   Assessment / Plan:     Visit Diagnoses: Rheumatoid arthritis involving multiple joints (HCC)-patient had no synovitis on the examination.  She denies any history of joint pain or joint swelling.  She has been tolerating therapy well and Plaquenil without any side effects.  High risk medication use - Arava 10 mg 1 tablet by mouth daily and Plaquenil 200 mg BID M-F. PLQ Eye exam:  05/22/2022.11/26/2022 CBC, CMP normal at [facility].  She was advised to get labs every 3 months to monitor for drug toxicity.  Information on immunization was placed in the AVS.  Patient has not had shingles vaccine yet.  Benefits of getting shingles vaccine were discussed.  She was also advised to hold leflunomide if she develops an infection resume after the infection resolves.  History of bronchiectasis - She was evaluated by Dr. Kendrick Fries in 2019.   Mild wheezing noted today.  She had high-resolution CT done on May 02, 2022 by Dr. Kendrick Fries.  I reviewed Dr. Ulyses Jarred note from May 14, 2022.  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  Primary osteoarthritis of both hands-she has bilateral PIP and DIP thickening.  Joint protection muscle strengthening was discussed.  History of fracture of right hip  Primary osteoarthritis of both knees-she continues to have some stiffness in her knee joints.  No warmth swelling or effusion was noted.  Effusion, right knee - Resolved.  No recurrence.  Primary osteoarthritis of both feet-proper fitting shoes were advised.  Plantar fasciitis, left-she had no recurrence.  Osteoporosis of multiple sites - Fosamax 70 mg 1 tablet by mouth once weekly. DEXA scan October 16, 2020 showed T score of -2.6.  Patient scheduled to have repeat DEXA scan.  I discussed that she may have to come off Fosamax as she has been taking it for some time.  Patient is concerned about her compliance with Fosamax.  History of pleural effusion - with no recurrence.  Paroxysmal atrial fibrillation (HCC)  History of hyperlipidemia  Orders: No orders of the defined types were placed in this encounter.  No orders of the defined types were placed in this encounter.    Follow-Up Instructions: Return in about 5 months (around 05/14/2023) for Rheumatoid arthritis, Osteoarthritis, Osteoporosis.   Pollyann Savoy,  MD  Note - This record has been created using Animal nutritionist.  Chart creation errors have been sought, but may not always  have been located. Such creation errors do not reflect on  the standard of medical care.

## 2022-12-03 DIAGNOSIS — J479 Bronchiectasis, uncomplicated: Secondary | ICD-10-CM | POA: Diagnosis not present

## 2022-12-03 DIAGNOSIS — Z1331 Encounter for screening for depression: Secondary | ICD-10-CM | POA: Diagnosis not present

## 2022-12-03 DIAGNOSIS — E785 Hyperlipidemia, unspecified: Secondary | ICD-10-CM | POA: Diagnosis not present

## 2022-12-03 DIAGNOSIS — M0510 Rheumatoid lung disease with rheumatoid arthritis of unspecified site: Secondary | ICD-10-CM | POA: Diagnosis not present

## 2022-12-03 DIAGNOSIS — Z0000 Encounter for general adult medical examination without abnormal findings: Secondary | ICD-10-CM | POA: Diagnosis not present

## 2022-12-03 DIAGNOSIS — Z1339 Encounter for screening examination for other mental health and behavioral disorders: Secondary | ICD-10-CM | POA: Diagnosis not present

## 2022-12-03 DIAGNOSIS — M069 Rheumatoid arthritis, unspecified: Secondary | ICD-10-CM | POA: Diagnosis not present

## 2022-12-03 DIAGNOSIS — Z23 Encounter for immunization: Secondary | ICD-10-CM | POA: Diagnosis not present

## 2022-12-03 DIAGNOSIS — M810 Age-related osteoporosis without current pathological fracture: Secondary | ICD-10-CM | POA: Diagnosis not present

## 2022-12-04 ENCOUNTER — Telehealth: Payer: Self-pay | Admitting: *Deleted

## 2022-12-04 NOTE — Telephone Encounter (Signed)
Labs received from:[facility]  Drawn on:11/26/2022  Reviewed by:Sherron Ales, PA-C  Labs drawn:CBC, CMP  Results: BASO% 2.4   MCV 100.0   RDW 12.1  Patient is on PLQ 200 mg po twice daily M-F and Arava 10 mg po daily.   Per Ladona Ridgel, we will continue to monitor.

## 2022-12-12 ENCOUNTER — Encounter: Payer: Self-pay | Admitting: Rheumatology

## 2022-12-12 ENCOUNTER — Ambulatory Visit: Payer: PPO | Attending: Rheumatology | Admitting: Rheumatology

## 2022-12-12 VITALS — BP 116/70 | HR 66 | Resp 13 | Ht 66.5 in | Wt 126.6 lb

## 2022-12-12 DIAGNOSIS — M19072 Primary osteoarthritis, left ankle and foot: Secondary | ICD-10-CM

## 2022-12-12 DIAGNOSIS — Z8639 Personal history of other endocrine, nutritional and metabolic disease: Secondary | ICD-10-CM | POA: Diagnosis not present

## 2022-12-12 DIAGNOSIS — Z79899 Other long term (current) drug therapy: Secondary | ICD-10-CM

## 2022-12-12 DIAGNOSIS — M19042 Primary osteoarthritis, left hand: Secondary | ICD-10-CM

## 2022-12-12 DIAGNOSIS — M069 Rheumatoid arthritis, unspecified: Secondary | ICD-10-CM | POA: Diagnosis not present

## 2022-12-12 DIAGNOSIS — M19071 Primary osteoarthritis, right ankle and foot: Secondary | ICD-10-CM

## 2022-12-12 DIAGNOSIS — I480 Paroxysmal atrial fibrillation: Secondary | ICD-10-CM | POA: Diagnosis not present

## 2022-12-12 DIAGNOSIS — M810 Age-related osteoporosis without current pathological fracture: Secondary | ICD-10-CM

## 2022-12-12 DIAGNOSIS — Z8709 Personal history of other diseases of the respiratory system: Secondary | ICD-10-CM

## 2022-12-12 DIAGNOSIS — M25461 Effusion, right knee: Secondary | ICD-10-CM

## 2022-12-12 DIAGNOSIS — Z8781 Personal history of (healed) traumatic fracture: Secondary | ICD-10-CM

## 2022-12-12 DIAGNOSIS — M170 Bilateral primary osteoarthritis of knee: Secondary | ICD-10-CM

## 2022-12-12 DIAGNOSIS — M722 Plantar fascial fibromatosis: Secondary | ICD-10-CM

## 2022-12-12 DIAGNOSIS — M19041 Primary osteoarthritis, right hand: Secondary | ICD-10-CM | POA: Diagnosis not present

## 2022-12-12 NOTE — Patient Instructions (Signed)
Standing Labs We placed an order today for your standing lab work.   Please have your standing labs drawn in August and every 3 months  Please have your labs drawn 2 weeks prior to your appointment so that the provider can discuss your lab results at your appointment, if possible.  Please note that you may see your imaging and lab results in MyChart before we have reviewed them. We will contact you once all results are reviewed. Please allow our office up to 72 hours to thoroughly review all of the results before contacting the office for clarification of your results.  WALK-IN LAB HOURS  Monday through Thursday from 8:00 am -12:30 pm and 1:00 pm-5:00 pm and Friday from 8:00 am-12:00 pm.  Patients with office visits requiring labs will be seen before walk-in labs.  You may encounter longer than normal wait times. Please allow additional time. Wait times may be shorter on  Monday and Thursday afternoons.  We do not book appointments for walk-in labs. We appreciate your patience and understanding with our staff.   Labs are drawn by [facility]. Please bring your co-pay at the time of your lab draw.  You may receive a bill from [facility] for your lab work.  Please note if you are on Hydroxychloroquine and and an order has been placed for a Hydroxychloroquine level,  you will need to have it drawn 4 hours or more after your last dose.  If you wish to have your labs drawn at another location, please call the office 24 hours in advance so we can fax the orders.  The office is located at [address], [address], [place], [place] [address]   If you have any questions regarding directions or hours of operation,  please call [phone].   As a reminder, please drink plenty of water prior to coming for your lab work. Thanks!   Vaccines You are taking a medication(s) that can suppress your immune system.  The following immunizations are recommended: Flu annually Covid-19  Td/Tdap (tetanus,  diphtheria, pertussis) every 10 years Pneumonia (Prevnar 15 then Pneumovax 23 at least 1 year apart.  Alternatively, can take Prevnar 20 without needing additional dose) Shingrix: 2 doses from 4 weeks to 6 months apart  Please check with your PCP to make sure you are up to date.   If you have signs or symptoms of an infection or start antibiotics: First, call your PCP for workup of your infection. Hold your medication through the infection, until you complete your antibiotics, and until symptoms resolve if you take the following: Injectable medication (Actemra, Benlysta, Cimzia, Cosentyx, Enbrel, Humira, Kevzara, Orencia, Remicade, Simponi, Stelara, Taltz, Tremfya) Methotrexate Leflunomide (Arava) Mycophenolate (Cellcept) Xeljanz, Olumiant, or Rinvoq  

## 2022-12-15 ENCOUNTER — Other Ambulatory Visit: Payer: Self-pay | Admitting: Physician Assistant

## 2022-12-15 DIAGNOSIS — M069 Rheumatoid arthritis, unspecified: Secondary | ICD-10-CM

## 2022-12-15 NOTE — Telephone Encounter (Signed)
Last Fill: 09/29/2022  Eye exam: 05/22/2022  Labs: Labs received from:[facility]   Drawn on:11/26/2022   Reviewed by:Sherron Ales, PA-C   Labs drawn:CBC, CMP   Results: BASO% 2.4   MCV 100.0   RDW 12.1  Next Visit: 04/24/2023  Last Visit: 12/12/2022  [id number] arthritis involving multiple joints   Current Dose per office note 12/12/2022 : Plaquenil 200 mg BID M-F   Okay to refill Plaquenil?

## 2022-12-21 ENCOUNTER — Other Ambulatory Visit: Payer: Self-pay | Admitting: Physician Assistant

## 2022-12-22 NOTE — Telephone Encounter (Signed)
Last Fill: 11/21/2022 (30 day supply)  Labs: 11/26/2022 BASO% 2.4   MCV 100.0   RDW 12.1  Next Visit: 04/24/2023  Last Visit: 12/12/2022  DX: Rheumatoid arthritis involving multiple joints   Current Dose per office note 12/12/2022: Arava 10 mg 1 tablet by mouth daily   Okay to refill Arava ?

## 2023-02-13 DIAGNOSIS — N959 Unspecified menopausal and perimenopausal disorder: Secondary | ICD-10-CM | POA: Diagnosis not present

## 2023-02-13 DIAGNOSIS — Z8262 Family history of osteoporosis: Secondary | ICD-10-CM | POA: Diagnosis not present

## 2023-02-13 DIAGNOSIS — M069 Rheumatoid arthritis, unspecified: Secondary | ICD-10-CM | POA: Diagnosis not present

## 2023-02-13 DIAGNOSIS — E349 Endocrine disorder, unspecified: Secondary | ICD-10-CM | POA: Diagnosis not present

## 2023-02-13 DIAGNOSIS — M8588 Other specified disorders of bone density and structure, other site: Secondary | ICD-10-CM | POA: Diagnosis not present

## 2023-03-09 ENCOUNTER — Ambulatory Visit: Payer: PPO | Admitting: Neurology

## 2023-03-10 ENCOUNTER — Ambulatory Visit: Payer: PPO | Admitting: Sports Medicine

## 2023-03-10 ENCOUNTER — Other Ambulatory Visit: Payer: Self-pay | Admitting: *Deleted

## 2023-03-10 ENCOUNTER — Ambulatory Visit
Admission: RE | Admit: 2023-03-10 | Discharge: 2023-03-10 | Disposition: A | Discharge status: Home / Self Care | Payer: PPO | Source: Ambulatory Visit | Attending: Sports Medicine | Admitting: Sports Medicine

## 2023-03-10 VITALS — BP 112/64 | Ht 66.0 in | Wt 125.0 lb

## 2023-03-10 DIAGNOSIS — Z79899 Other long term (current) drug therapy: Secondary | ICD-10-CM | POA: Diagnosis not present

## 2023-03-10 DIAGNOSIS — M25462 Effusion, left knee: Secondary | ICD-10-CM | POA: Diagnosis not present

## 2023-03-10 DIAGNOSIS — M25562 Pain in left knee: Secondary | ICD-10-CM

## 2023-03-10 DIAGNOSIS — M069 Rheumatoid arthritis, unspecified: Secondary | ICD-10-CM | POA: Diagnosis not present

## 2023-03-10 DIAGNOSIS — M1712 Unilateral primary osteoarthritis, left knee: Secondary | ICD-10-CM | POA: Diagnosis not present

## 2023-03-10 DIAGNOSIS — G8929 Other chronic pain: Secondary | ICD-10-CM | POA: Diagnosis not present

## 2023-03-10 NOTE — Patient Instructions (Signed)
[facility] Located in: [facility] [address], [place], [place] [address] Phone: [phone]

## 2023-03-10 NOTE — Progress Notes (Signed)
   Subjective:    Patient ID: [name] [name], female    DOB: [birth year], 72 y.o.   MRN: [id number]  HPI chief complaint: Left knee pain  Tyshonna presents today with persistent intermittent left knee pain.  She was last seen in the office in December with a similar complaint.  She has had a couple of episodes of instability followed by a single episode of falling due to her knee giving way.  Her pain is in the posterior knee and is most noticeable at night.  She has not noticed any swelling.  She was previously treated with home exercises and a compression sleeve but she found the compression sleeve to be uncomfortable.  She also has pain with deep squatting and is unable to squat much below 90 degrees.    Review of Systems As above    Objective:   Physical Exam  Well-developed, well-nourished.  No acute distress  Left knee: Range of motion is 0 to about 100 degrees.  No effusion.  Slight tenderness to palpation along the medial joint line.  No tenderness medially.  Negative McMurray's.  Knee is stable to ligamentous exam.  Limited MSK ultrasound of the left knee does show a trace effusion in the suprapatellar pouch.  There is also some hypoechoic change within the lateral meniscus which suggest an intrasubstance tear with associated meniscus bulging from the lateral joint line.  Medial joint line is unremarkable.      Assessment & Plan:   Left knee pain secondary to DJD versus degenerative lateral meniscal tear  I would like to order some x-rays to evaluate the degree of osteoarthritis present and to rule out loose bodies.  [facility] at [facility]  I will follow-up with Kennon Rounds via telephone with x-ray results.  This note was dictated using Dragon naturally speaking software and may contain errors in syntax, spelling, or content which have not been identified prior to signing this note.   Addendum: X-ray reviewed.  Minimal degenerative changes seen and no obvious  loose body.

## 2023-03-11 ENCOUNTER — Other Ambulatory Visit: Payer: Self-pay | Admitting: *Deleted

## 2023-03-11 DIAGNOSIS — M069 Rheumatoid arthritis, unspecified: Secondary | ICD-10-CM

## 2023-03-11 DIAGNOSIS — Z79899 Other long term (current) drug therapy: Secondary | ICD-10-CM

## 2023-03-11 LAB — COMPLETE METABOLIC PANEL WITH GFR
AG Ratio: 1.7 (calc) (ref 1.0–2.5)
ALT: 10 U/L (ref 6–29)
AST: 22 U/L (ref 10–35)
Albumin: 4 g/dL (ref 3.6–5.1)
Alkaline phosphatase (APISO): 70 U/L (ref 37–153)
BUN/Creatinine Ratio: 19 (calc) (ref 6–22)
BUN: 21 mg/dL (ref 7–25)
CO2: 32 mmol/L (ref 20–32)
Calcium: 9.4 mg/dL (ref 8.6–10.4)
Chloride: 105 mmol/L (ref 98–110)
Creat: 1.11 mg/dL — ABNORMAL HIGH (ref 0.60–1.00)
Globulin: 2.4 g/dL (ref 1.9–3.7)
Glucose, Bld: 86 mg/dL (ref 65–99)
Potassium: 4.3 mmol/L (ref 3.5–5.3)
Sodium: 143 mmol/L (ref 135–146)
Total Bilirubin: 0.4 mg/dL (ref 0.2–1.2)
Total Protein: 6.4 g/dL (ref 6.1–8.1)
eGFR: 52 mL/min/{1.73_m2} — ABNORMAL LOW (ref >=60)

## 2023-03-11 LAB — CBC WITH DIFFERENTIAL/PLATELET
Absolute Monocytes: 642 cells/uL (ref 200–950)
Basophils Absolute: 69 {cells}/uL (ref 0–200)
Basophils Relative: 1 %
Eosinophils Absolute: 179 {cells}/uL (ref 15–500)
Eosinophils Relative: 2.6 %
HCT: 37.5 % (ref 35.0–45.0)
Hemoglobin: 12.2 g/dL (ref 11.7–15.5)
Lymphs Abs: 1566 {cells}/uL (ref 850–3900)
MCH: 31.6 pg (ref 27.0–33.0)
MCHC: 32.5 g/dL (ref 32.0–36.0)
MCV: 97.2 fL (ref 80.0–100.0)
MPV: 10.1 fL (ref 7.5–12.5)
Monocytes Relative: 9.3 %
Neutro Abs: 4444 {cells}/uL (ref 1500–7800)
Neutrophils Relative %: 64.4 %
Platelets: 250 10*3/uL (ref 140–400)
RBC: 3.86 10*6/uL (ref 3.80–5.10)
RDW: 11.8 % (ref 11.0–15.0)
Total Lymphocyte: 22.7 %
WBC: 6.9 10*3/uL (ref 3.8–10.8)

## 2023-03-11 NOTE — Progress Notes (Signed)
Creatinine is mildly elevated.  CBC is normal.  Patient should avoid all NSAIDs and increase water intake.  Repeat BMP in 3 weeks.

## 2023-03-13 ENCOUNTER — Other Ambulatory Visit: Payer: Self-pay | Admitting: Rheumatology

## 2023-03-13 NOTE — Telephone Encounter (Signed)
Last Fill: 12/22/2022  Labs: 03/10/2023 Creatinine is mildly elevated.  CBC is normal.     Next Visit: 04/24/2023  Last Visit: 12/12/2022  DX: Rheumatoid arthritis involving multiple joints   Current Dose per office note 12/12/2022: Arava 10 mg 1 tablet by mouth daily   Okay to refill Arava ?

## 2023-03-17 DIAGNOSIS — M25569 Pain in unspecified knee: Secondary | ICD-10-CM | POA: Diagnosis not present

## 2023-03-31 ENCOUNTER — Other Ambulatory Visit: Payer: Self-pay | Admitting: *Deleted

## 2023-03-31 DIAGNOSIS — Z79899 Other long term (current) drug therapy: Secondary | ICD-10-CM | POA: Diagnosis not present

## 2023-03-31 DIAGNOSIS — M069 Rheumatoid arthritis, unspecified: Secondary | ICD-10-CM

## 2023-03-31 DIAGNOSIS — M25569 Pain in unspecified knee: Secondary | ICD-10-CM | POA: Diagnosis not present

## 2023-04-01 NOTE — Progress Notes (Signed)
Creatinine is better but is still low.  Patient should avoid all NSAIDs and continue to increase water intake.

## 2023-04-08 DIAGNOSIS — Z23 Encounter for immunization: Secondary | ICD-10-CM | POA: Diagnosis not present

## 2023-04-10 ENCOUNTER — Other Ambulatory Visit: Payer: Self-pay | Admitting: Physician Assistant

## 2023-04-10 DIAGNOSIS — M069 Rheumatoid arthritis, unspecified: Secondary | ICD-10-CM

## 2023-04-10 NOTE — Progress Notes (Signed)
Office Visit Note  Patient: [name] [name]             Date of Birth: 01-21-1951           MRN: [id number]             PCP: Melida Quitter, MD Referring: Melida Quitter, MD Visit Date: 04/24/2023 Occupation: @GUAROCC @  Subjective:  Medication management  History of Present Illness: [name] [name] is a 72 y.o. female with rheumatoid arthritis, osteoarthritis and osteoporosis.  She states she has not had any flares of rheumatoid arthritis.  She had been taking hydroxychloroquine 200 mg twice daily Monday to Friday and leflunomide 10 mg p.o. daily.  She has been having some discomfort in her left knee where she had meniscal tear.  She is going to physical therapy.  She did not get any bronchiectasis flares.  She is planning to schedule a follow-up visit with Dr. Kendrick Fries.  She states she recently had a DEXA scan which showed deterioration of the BMD.  I do not have the results available.  Patient states she was switched from Fosamax to Prolia injections.  She has not started Prolia yet.  She has been taking calcium and vitamin D.    Activities of Daily Living:  Patient reports morning stiffness for 0 minutes.   Patient Denies nocturnal pain.  Difficulty dressing/grooming: Denies Difficulty climbing stairs: Denies Difficulty getting out of chair: Denies Difficulty using hands for taps, buttons, cutlery, and/or writing: Denies  Review of Systems  Constitutional:  Negative for fatigue.  HENT:  Negative for mouth sores and mouth dryness.   Eyes:  Negative for dryness.  Respiratory:  Negative for shortness of breath.   Cardiovascular:  Negative for chest pain and palpitations.  Gastrointestinal:  Negative for blood in stool, constipation and diarrhea.  Endocrine: Negative for increased urination.  Genitourinary:  Negative for involuntary urination.  Musculoskeletal:  Negative for joint pain, gait problem, joint pain, joint swelling, myalgias, muscle weakness, morning stiffness, muscle tenderness  and myalgias.  Skin:  Negative for color change, rash, hair loss and sensitivity to sunlight.  Allergic/Immunologic: Negative for susceptible to infections.  Neurological:  Negative for dizziness and headaches.  Hematological:  Negative for swollen glands.  Psychiatric/Behavioral:  Negative for depressed mood and sleep disturbance. The patient is not nervous/anxious.     PMFS History:  Patient Active Problem List   Diagnosis Date Noted   Acute pain of left knee 01/17/2021   Primary osteoarthritis of both knees 01/08/2017   Primary osteoarthritis of both feet 01/08/2017   History of pleural effusion 12/01/2016   History of recurrent pneumonia 12/01/2016   History of hyperlipidemia 12/01/2016   High risk medication use 12/01/2016   Trigger thumb, left thumb 12/01/2016   History of fracture of right hip 12/01/2016   Primary osteoarthritis of both hands 12/01/2016   Rheumatoid arthritis involving multiple joints (HCC) 04/09/2015   Paroxysmal atrial fibrillation (HCC)    Tendonitis 04/03/2015   Orthostatic syncope    Syncope 03/18/2015   Osteoporosis    Hyperlipidemia 11/14/2013    Past Medical History:  Diagnosis Date   Elevated cholesterol    Migraines    occular   Osteoporosis 2015   based on hip fracture from fall  DEXA 01/2018 T score -2.3   Pleural effusion 9/206   Bilateral effusions, s/p thora on R c/w exudate   Pneumonia 03/2015   R   RA (rheumatoid arthritis) (HCC)    Skin cancer (melanoma) (HCC)  Right Arm, nose.     Family History  Problem Relation Age of Onset   Heart attack Father 51   Heart disease Father    Osteoporosis Sister    High Cholesterol Sister    Melanoma Daughter    Melanoma Daughter    Past Surgical History:  Procedure Laterality Date   COLONOSCOPY WITH PROPOFOL N/A 11/04/2016   Procedure: COLONOSCOPY WITH PROPOFOL;  Surgeon: Charolett Bumpers, MD;  Location: [facility];  Service: Endoscopy;  Laterality: N/A;   FINGER SURGERY     HIP  ARTHROPLASTY Right 11/14/2013   Procedure: RIGHT CANNULATED HIP PINNING;  Surgeon: Harvie Junior, MD;  Location: [facility];  Service: Orthopedics;  Laterality: Right;  BIOMET   SQUAMOUS CELL CARCINOMA EXCISION  2021   behind knee    SQUAMOUS CELL CARCINOMA EXCISION Left 11/2022   left hand   VIDEO BRONCHOSCOPY Bilateral 04/05/2015   Procedure: VIDEO BRONCHOSCOPY WITHOUT FLUORO;  Surgeon: Lupita Leash, MD;  Location: [facility];  Service: Cardiopulmonary;  Laterality: Bilateral;   Social History   Social History Narrative   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   Two children (32 and 53- both daughters) local and she has one Arboriculturist History  Administered Date(s) Administered   Influenza, High Dose Seasonal PF 04/28/2017, 04/28/2022   Influenza-Unspecified 04/23/2015, 04/08/2023   Moderna Sars-Covid-2 Vaccination 04/08/2023   PFIZER(Purple Top)SARS-COV-2 Vaccination 08/05/2019, 08/26/2019, 04/19/2020, 11/27/2020, 05/16/2021   Pneumococcal Conjugate-13 07/29/2016   Pneumococcal Polysaccharide-23 10/09/2014   Tdap 01/08/2016     Objective: Vital Signs: BP (!) 93/57 (BP Location: Left Arm, Patient Position: Sitting, Cuff Size: Normal)   Pulse 79   Resp 14   Ht 5\' 6"  (1.676 m)   Wt 127 lb (57.6 kg)   LMP 07/14/1993   BMI 20.50 kg/m    Physical Exam Vitals and nursing note reviewed.  Constitutional:      Appearance: She is well-developed.  HENT:     Head: Normocephalic and atraumatic.  Eyes:     Conjunctiva/sclera: Conjunctivae normal.  Cardiovascular:     Rate and Rhythm: Normal rate and regular rhythm.     Heart sounds: Normal heart sounds.  Pulmonary:     Effort: Pulmonary effort is normal.     Breath sounds: Normal breath sounds.  Abdominal:     General: Bowel sounds are normal.     Palpations: Abdomen is soft.  Musculoskeletal:     Cervical back: Normal range of motion.  Lymphadenopathy:     Cervical: No cervical  adenopathy.  Skin:    General: Skin is warm and dry.     Capillary Refill: Capillary refill takes less than 2 seconds.  Neurological:     Mental Status: She is alert and oriented to person, place, and time.  Psychiatric:        Behavior: Behavior normal.      Musculoskeletal Exam: Cervical, thoracic and lumbar spine 1 good range of motion.  Shoulder joints, elbow joints, wrist joints, MCPs PIPs and DIPs with good range of motion.  She had bilateral PIP and DIP thickening.  MCP thickening with no synovitis was noted.  Hip joints and knee joints were in good range of motion without any warmth swelling or effusion.  There was no tenderness over ankles or MTPs.  CDAI Exam: CDAI Score: -- Patient Global: 0 / 100; Provider Global: 0 / 100 Swollen: --; Tender: -- Joint Exam 04/24/2023   No  joint exam has been documented for this visit   There is currently no information documented on the homunculus. Go to the Rheumatology activity and complete the homunculus joint exam.  Investigation: No additional findings.  Imaging: No results found.  Recent Labs: Lab Results  Component Value Date   WBC 6.9 03/10/2023   HGB 12.2 03/10/2023   PLT 250 03/10/2023   NA 141 03/31/2023   K 4.4 03/31/2023   CL 102 03/31/2023   CO2 31 03/31/2023   GLUCOSE 93 03/31/2023   BUN 21 03/31/2023   CREATININE 1.07 (H) 03/31/2023   BILITOT 0.4 03/10/2023   ALKPHOS 77 08/04/2019   AST 22 03/10/2023   ALT 10 03/10/2023   PROT 6.4 03/10/2023   ALBUMIN 4.5 08/04/2019   CALCIUM 9.8 03/31/2023   GFRAA 70 01/02/2021    Speciality Comments: PLQ Eye exam: 05/22/2022 WNL @ [facility].  Arava started in 2017 by Dr. Dedra Skeens started 01/2017 Fosamax 2015-2018 offx 2years then resumed 2020  Procedures:  No procedures performed Allergies: Levaquin [levofloxacin] and Cephalexin   Assessment / Plan:     Visit Diagnoses: Rheumatoid arthritis involving multiple joints (HCC)-her rheumatoid  arthritis is well-controlled with no synovitis on the examination.  She denies any flares since the last visit.  We may consider tapering Plaquenil in the future.  High risk medication use - Arava 10 mg 1 tablet by mouth daily and Plaquenil 200 mg BID M-F.  Labs obtained on March 10, 2023 CBC was normal.  CMP showed creatinine elevated at 1.11.  Repeat creatinine in September was 1.07.  Patient was advised to avoid all NSAIDs and increase water intake.  Will recheck labs in November and every 3 months to monitor for drug toxicity.  PLQ Eye exam: 05/22/2022.  Patient has an eye examination coming up.  History of bronchiectasis - She was evaluated by Dr. Kendrick Fries in 2019.   Mild wheezing noted today.  She had high-resolution CT done on May 02, 2022 by Dr. Kendrick Fries.  Patient denies any flares of bronchiectasis this year.  She will schedule a follow-up appointment with Dr. Kendrick Fries.  Primary osteoarthritis of both hands-she has rheumatoid arthritis and osteoarthritis overlap with the MCP PIP and DIP thickening.  No synovitis was noted.  History of fracture of right hip  Primary osteoarthritis of both knees-she states she has been having some discomfort in her left knee joint due to meniscal tear in the past.  She has been going to physical therapy.  Effusion, right knee - Resolved.  No recurrence.  Primary osteoarthritis of both feet-she denies any discomfort today.  No synovitis was noted.  Osteoporosis of multiple sites - DEXA scan October 16, 2020 showed T score of -2.6.  Patient reports having repeat DEXA scan few weeks ago which showed deterioration of BMD.  Patient states she will be starting Prolia soon per Dr. Nadene Rubins.  She discontinued Fosamax.  She has been taking calcium and vitamin D.  Need for regular exercise was emphasized.  History of pleural effusion - with no recurrence.  Paroxysmal atrial fibrillation (HCC)  History of hyperlipidemia  Orders: No orders of the defined types were  placed in this encounter.  No orders of the defined types were placed in this encounter.    Follow-Up Instructions: Return in about 5 months (around 09/22/2023) for Rheumatoid arthritis, Osteoarthritis, Osteoporosis.   Pollyann Savoy, MD  Note - This record has been created using Animal nutritionist.  Chart creation errors have been sought, but may  not always  have been located. Such creation errors do not reflect on  the standard of medical care.

## 2023-04-13 NOTE — Telephone Encounter (Signed)
Last Fill: 12/15/2022  Eye exam: 05/22/2022 WNL   Labs: 03/10/2023 Creatinine is mildly elevated.  CBC is normal.   03/31/2023 Creatinine is better but is still low.     Next Visit: 04/24/2023  Last Visit: 12/12/2022  LO:[id number] arthritis involving multiple joints   Current Dose per office note 12/12/2022: Plaquenil 200 mg BID M-F   Okay to refill Plaquenil?

## 2023-04-14 DIAGNOSIS — M25569 Pain in unspecified knee: Secondary | ICD-10-CM | POA: Diagnosis not present

## 2023-04-14 DIAGNOSIS — M810 Age-related osteoporosis without current pathological fracture: Secondary | ICD-10-CM | POA: Diagnosis not present

## 2023-04-15 DIAGNOSIS — H2513 Age-related nuclear cataract, bilateral: Secondary | ICD-10-CM | POA: Diagnosis not present

## 2023-04-15 DIAGNOSIS — Z79899 Other long term (current) drug therapy: Secondary | ICD-10-CM | POA: Diagnosis not present

## 2023-04-22 DIAGNOSIS — L814 Other melanin hyperpigmentation: Secondary | ICD-10-CM | POA: Diagnosis not present

## 2023-04-22 DIAGNOSIS — Z85820 Personal history of malignant melanoma of skin: Secondary | ICD-10-CM | POA: Diagnosis not present

## 2023-04-22 DIAGNOSIS — L578 Other skin changes due to chronic exposure to nonionizing radiation: Secondary | ICD-10-CM | POA: Diagnosis not present

## 2023-04-22 DIAGNOSIS — L821 Other seborrheic keratosis: Secondary | ICD-10-CM | POA: Diagnosis not present

## 2023-04-22 DIAGNOSIS — Z85828 Personal history of other malignant neoplasm of skin: Secondary | ICD-10-CM | POA: Diagnosis not present

## 2023-04-22 DIAGNOSIS — L570 Actinic keratosis: Secondary | ICD-10-CM | POA: Diagnosis not present

## 2023-04-22 DIAGNOSIS — D225 Melanocytic nevi of trunk: Secondary | ICD-10-CM | POA: Diagnosis not present

## 2023-04-24 ENCOUNTER — Ambulatory Visit: Payer: PPO | Attending: Rheumatology | Admitting: Rheumatology

## 2023-04-24 ENCOUNTER — Encounter: Payer: Self-pay | Admitting: Rheumatology

## 2023-04-24 VITALS — BP 93/57 | HR 79 | Resp 14 | Ht 66.0 in | Wt 127.0 lb

## 2023-04-24 DIAGNOSIS — Z8639 Personal history of other endocrine, nutritional and metabolic disease: Secondary | ICD-10-CM

## 2023-04-24 DIAGNOSIS — M19071 Primary osteoarthritis, right ankle and foot: Secondary | ICD-10-CM | POA: Diagnosis not present

## 2023-04-24 DIAGNOSIS — M19042 Primary osteoarthritis, left hand: Secondary | ICD-10-CM

## 2023-04-24 DIAGNOSIS — M170 Bilateral primary osteoarthritis of knee: Secondary | ICD-10-CM

## 2023-04-24 DIAGNOSIS — Z8781 Personal history of (healed) traumatic fracture: Secondary | ICD-10-CM

## 2023-04-24 DIAGNOSIS — M810 Age-related osteoporosis without current pathological fracture: Secondary | ICD-10-CM | POA: Diagnosis not present

## 2023-04-24 DIAGNOSIS — Z79899 Other long term (current) drug therapy: Secondary | ICD-10-CM

## 2023-04-24 DIAGNOSIS — Z8709 Personal history of other diseases of the respiratory system: Secondary | ICD-10-CM | POA: Diagnosis not present

## 2023-04-24 DIAGNOSIS — M069 Rheumatoid arthritis, unspecified: Secondary | ICD-10-CM

## 2023-04-24 DIAGNOSIS — M25461 Effusion, right knee: Secondary | ICD-10-CM

## 2023-04-24 DIAGNOSIS — M19041 Primary osteoarthritis, right hand: Secondary | ICD-10-CM

## 2023-04-24 DIAGNOSIS — I480 Paroxysmal atrial fibrillation: Secondary | ICD-10-CM

## 2023-04-24 DIAGNOSIS — M722 Plantar fascial fibromatosis: Secondary | ICD-10-CM

## 2023-04-24 DIAGNOSIS — M19072 Primary osteoarthritis, left ankle and foot: Secondary | ICD-10-CM

## 2023-04-24 NOTE — Patient Instructions (Signed)
Standing Labs We placed an order today for your standing lab work.   Please have your standing labs drawn in November and every 3 months  Please have your labs drawn 2 weeks prior to your appointment so that the provider can discuss your lab results at your appointment, if possible.  Please note that you may see your imaging and lab results in MyChart before we have reviewed them. We will contact you once all results are reviewed. Please allow our office up to 72 hours to thoroughly review all of the results before contacting the office for clarification of your results.  WALK-IN LAB HOURS  Monday through Thursday from 8:00 am -12:30 pm and 1:00 pm-5:00 pm and Friday from 8:00 am-12:00 pm.  Patients with office visits requiring labs will be seen before walk-in labs.  You may encounter longer than normal wait times. Please allow additional time. Wait times may be shorter on  Monday and Thursday afternoons.  We do not book appointments for walk-in labs. We appreciate your patience and understanding with our staff.   Labs are drawn by [facility]. Please bring your co-pay at the time of your lab draw.  You may receive a bill from [facility] for your lab work.  Please note if you are on Hydroxychloroquine and and an order has been placed for a Hydroxychloroquine level,  you will need to have it drawn 4 hours or more after your last dose.  If you wish to have your labs drawn at another location, please call the office 24 hours in advance so we can fax the orders.  The office is located at [address], [place], [place] [address]   If you have any questions regarding directions or hours of operation,  please call [phone].   As a reminder, please drink plenty of water prior to coming for your lab work. Thanks!   Vaccines You are taking a medication(s) that can suppress your immune system.  The following immunizations are recommended: Flu annually Covid-19 RSV  Td/Tdap (tetanus,  diphtheria, pertussis) every 10 years Pneumonia (Prevnar 15 then Pneumovax 23 at least 1 year apart.  Alternatively, can take Prevnar 20 without needing additional dose) Shingrix: 2 doses from 4 weeks to 6 months apart  Please check with your PCP to make sure you are up to date.  If you have signs or symptoms of an infection or start antibiotics: First, call your PCP for workup of your infection. Hold your medication through the infection, until you complete your antibiotics, and until symptoms resolve if you take the following: Injectable medication (Actemra, Benlysta, Cimzia, Cosentyx, Enbrel, Humira, Kevzara, Orencia, Remicade, Simponi, Stelara, Taltz, Tremfya) Methotrexate Leflunomide (Arava) Mycophenolate (Cellcept) Harriette Ohara, Olumiant, or Rinvoq

## 2023-04-27 DIAGNOSIS — M25569 Pain in unspecified knee: Secondary | ICD-10-CM | POA: Diagnosis not present

## 2023-05-02 DIAGNOSIS — M25569 Pain in unspecified knee: Secondary | ICD-10-CM | POA: Diagnosis not present

## 2023-05-11 DIAGNOSIS — M25569 Pain in unspecified knee: Secondary | ICD-10-CM | POA: Diagnosis not present

## 2023-05-21 ENCOUNTER — Other Ambulatory Visit (HOSPITAL_COMMUNITY): Payer: Self-pay

## 2023-05-22 ENCOUNTER — Encounter (HOSPITAL_COMMUNITY)
Admission: RE | Admit: 2023-05-22 | Discharge: 2023-05-22 | Disposition: A | Discharge status: Home / Self Care | Payer: PPO | Source: Ambulatory Visit | Attending: Internal Medicine | Admitting: Internal Medicine

## 2023-05-22 DIAGNOSIS — M810 Age-related osteoporosis without current pathological fracture: Secondary | ICD-10-CM | POA: Insufficient documentation

## 2023-05-22 MED ORDER — DENOSUMAB 60 MG/ML ~~LOC~~ SOSY
60.0000 mg | PREFILLED_SYRINGE | Freq: Once | SUBCUTANEOUS | Status: AC
  Administered 2023-05-22: 60 mg via SUBCUTANEOUS

## 2023-05-22 MED ORDER — DENOSUMAB 60 MG/ML ~~LOC~~ SOSY
PREFILLED_SYRINGE | SUBCUTANEOUS | Status: AC
  Filled 2023-05-22: qty 1

## 2023-05-27 DIAGNOSIS — M25569 Pain in unspecified knee: Secondary | ICD-10-CM | POA: Diagnosis not present

## 2023-06-03 DIAGNOSIS — M25569 Pain in unspecified knee: Secondary | ICD-10-CM | POA: Diagnosis not present

## 2023-06-10 DIAGNOSIS — H903 Sensorineural hearing loss, bilateral: Secondary | ICD-10-CM | POA: Diagnosis not present

## 2023-06-18 DIAGNOSIS — M25569 Pain in unspecified knee: Secondary | ICD-10-CM | POA: Diagnosis not present

## 2023-07-13 ENCOUNTER — Other Ambulatory Visit: Payer: Self-pay | Admitting: *Deleted

## 2023-07-13 DIAGNOSIS — Z79899 Other long term (current) drug therapy: Secondary | ICD-10-CM

## 2023-07-13 DIAGNOSIS — M069 Rheumatoid arthritis, unspecified: Secondary | ICD-10-CM

## 2023-07-13 DIAGNOSIS — Z8639 Personal history of other endocrine, nutritional and metabolic disease: Secondary | ICD-10-CM | POA: Diagnosis not present

## 2023-07-14 ENCOUNTER — Other Ambulatory Visit: Payer: Self-pay | Admitting: *Deleted

## 2023-07-14 DIAGNOSIS — M069 Rheumatoid arthritis, unspecified: Secondary | ICD-10-CM

## 2023-07-14 LAB — LIPID PANEL
Cholesterol: 204 mg/dL — ABNORMAL HIGH (ref <200)
HDL: 86 mg/dL (ref >=50)
LDL Cholesterol (Calc): 103 mg/dL — ABNORMAL HIGH
Non-HDL Cholesterol (Calc): 118 mg/dL (ref <130)
Total CHOL/HDL Ratio: 2.4 (calc) (ref <5.0)
Triglycerides: 64 mg/dL (ref <150)

## 2023-07-14 LAB — COMPLETE METABOLIC PANEL WITH GFR
AG Ratio: 1.9 (calc) (ref 1.0–2.5)
ALT: 6 U/L (ref 6–29)
AST: 19 U/L (ref 10–35)
Albumin: 4.2 g/dL (ref 3.6–5.1)
Alkaline phosphatase (APISO): 77 U/L (ref 37–153)
BUN: 21 mg/dL (ref 7–25)
CO2: 31 mmol/L (ref 20–32)
Calcium: 9.5 mg/dL (ref 8.6–10.4)
Chloride: 104 mmol/L (ref 98–110)
Creat: 0.91 mg/dL (ref 0.60–1.00)
Globulin: 2.2 g/dL (ref 1.9–3.7)
Glucose, Bld: 103 mg/dL — ABNORMAL HIGH (ref 65–99)
Potassium: 4.4 mmol/L (ref 3.5–5.3)
Sodium: 140 mmol/L (ref 135–146)
Total Bilirubin: 0.3 mg/dL (ref 0.2–1.2)
Total Protein: 6.4 g/dL (ref 6.1–8.1)
eGFR: 66 mL/min/{1.73_m2} (ref >=60)

## 2023-07-14 LAB — CBC WITH DIFFERENTIAL/PLATELET
Absolute Lymphocytes: 1628 {cells}/uL (ref 850–3900)
Absolute Monocytes: 625 {cells}/uL (ref 200–950)
Basophils Absolute: 47 {cells}/uL (ref 0–200)
Basophils Relative: 0.8 %
Eosinophils Absolute: 307 {cells}/uL (ref 15–500)
Eosinophils Relative: 5.2 %
HCT: 37 % (ref 35.0–45.0)
Hemoglobin: 12.1 g/dL (ref 11.7–15.5)
MCH: 31.7 pg (ref 27.0–33.0)
MCHC: 32.7 g/dL (ref 32.0–36.0)
MCV: 96.9 fL (ref 80.0–100.0)
MPV: 10.4 fL (ref 7.5–12.5)
Monocytes Relative: 10.6 %
Neutro Abs: 3292 {cells}/uL (ref 1500–7800)
Neutrophils Relative %: 55.8 %
Platelets: 275 10*3/uL (ref 140–400)
RBC: 3.82 10*6/uL (ref 3.80–5.10)
RDW: 11.5 % (ref 11.0–15.0)
Total Lymphocyte: 27.6 %
WBC: 5.9 10*3/uL (ref 3.8–10.8)

## 2023-07-14 MED ORDER — HYDROXYCHLOROQUINE SULFATE 200 MG PO TABS: ORAL_TABLET | ORAL | 0 refills | Status: DC

## 2023-07-14 NOTE — Telephone Encounter (Signed)
 Last Fill: 04/13/2023  Eye exam: 05/22/2022 appt scheduled 10/2023   Labs: 07/13/2023 CBC and CMP WNL Total cholesterol is borderline elevated-204.  LDL is borderline elevated-103. Please notify the patient and forward results to PCP.  Next Visit: 09/23/2023  Last Visit: 04/24/2023  IK:Myzlfjunpi arthritis involving multiple joints   Current Dose per office note 04/24/2023: Plaquenil  200 mg BID M-F.   Okay to refill Plaquenil ?

## 2023-07-22 DIAGNOSIS — M25569 Pain in unspecified knee: Secondary | ICD-10-CM | POA: Diagnosis not present

## 2023-07-28 ENCOUNTER — Other Ambulatory Visit: Payer: Self-pay | Admitting: *Deleted

## 2023-07-28 MED ORDER — LEFLUNOMIDE 10 MG PO TABS: ORAL_TABLET | ORAL | 0 refills | Status: DC

## 2023-07-28 NOTE — Telephone Encounter (Signed)
 Refill request received via fax from [facility] [address]  for Arava   Last Fill: 03/13/2023  Labs: 07/13/2023 CBC and CMP WNL Total cholesterol is borderline elevated-204.  LDL is borderline elevated-103.  Next Visit: 09/23/2023  Last Visit: 04/24/2023  DX: Rheumatoid arthritis involving multiple joints   Current Dose per office note 04/24/2023: Arava  10 mg 1 tablet by mouth daily   Okay to refill Arava  ?

## 2023-08-11 ENCOUNTER — Ambulatory Visit: Payer: PPO | Admitting: Sports Medicine

## 2023-08-11 VITALS — BP 122/80 | Ht 66.0 in | Wt 125.0 lb

## 2023-08-11 DIAGNOSIS — M170 Bilateral primary osteoarthritis of knee: Secondary | ICD-10-CM | POA: Diagnosis not present

## 2023-08-11 NOTE — Progress Notes (Signed)
Chief complaint follow-up of bilateral knee pain left worse than right  Patient is seen Dr. Margaretha Sheffield about a year ago when her left knee gave out She was felt to have a degenerative meniscus and did physical therapy Since that time the knee seems to be working well.  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  She stays physically active walks and exercises regularly  Review of systems regarding her knees reveals no significant swelling no locking or giving way in greater than 1 year  Physical exam pleasant thin white female BP 122/80   Ht 5\' 6"  (1.676 m)   Wt 125 lb (56.7 kg)   LMP 07/14/1993   BMI 20.18 kg/m   Knee: Right and left Normal to inspection with no erythema or effusion or obvious bony abnormalities. Palpation normal with no warmth or joint line tenderness or patellar tenderness or condyle tenderness. ROM normal in flexion and extension and lower leg rotation. Ligaments with solid consistent endpoints including ACL, PCL, LCL, MCL. Negative Mcmurray's and provocative meniscal tests. Non painful patellar compression. Patellar and quadriceps tendons unremarkable. Hamstring and quadriceps strength is normal.  Note of the right knee she has about 10 degrees more flexion than on the left.  The left lacks about 3 degrees of full extension.

## 2023-08-11 NOTE — Assessment & Plan Note (Signed)
I think she is very well with this and the degree of arthritis is truly mild With that in mind I suggested that we keep her on a group of home exercises that she does at least 3 times a week Lateral hip abduction even though her strength is still good Wall slides 1 leg Mini 1 leg knee bends  Note in the office she gets some horizontal translation when she tries 1 leg knee bends Otherwise she can continue her normal activities If she continues to have no pain I do not see any reason she needs to cut back She can recheck with [place] as needed

## 2023-08-12 DIAGNOSIS — L570 Actinic keratosis: Secondary | ICD-10-CM | POA: Diagnosis not present

## 2023-08-12 DIAGNOSIS — D485 Neoplasm of uncertain behavior of skin: Secondary | ICD-10-CM | POA: Diagnosis not present

## 2023-09-09 ENCOUNTER — Other Ambulatory Visit: Payer: Self-pay | Admitting: Physician Assistant

## 2023-09-09 NOTE — Progress Notes (Unsigned)
 Office Visit Note  Patient: [name] [name]             Date of Birth: 07-Mar-1951           MRN: [id number]             PCP: Melida Quitter, MD Referring: Melida Quitter, MD Visit Date: 09/23/2023 Occupation: @GUAROCC @  Subjective:  Medication monitoring   History of Present Illness: [name] [name] is a 73 y.o. female with history of rheumatoid arthritis and osteoarthritis.  Patient is taking  Arava 10 mg 1 tablet by mouth daily and Plaquenil 200 mg BID M-F.  She is tolerating combination therapy without any side effects and has not had any recent gaps in therapy.  Patient denies any signs or symptoms of a rheumatoid arthritis flare.  Patient states that her symptoms have been so well-controlled she does not even feel that she has active rheumatoid arthritis.  Patient remains very active working in the garden as well as walking on a regular basis for exercise.  She denies any joint swelling at this time.  She denies any recent or recurrent infections.    Activities of Daily Living:  Patient reports morning stiffness for 0 minutes  Patient Denies nocturnal pain.  Difficulty dressing/grooming: Denies Difficulty climbing stairs: Denies Difficulty getting out of chair: Denies Difficulty using hands for taps, buttons, cutlery, and/or writing: Denies  Review of Systems  Constitutional:  Negative for fatigue.  HENT:  Negative for mouth sores and mouth dryness.   Eyes:  Negative for dryness.  Respiratory:  Negative for shortness of breath.   Cardiovascular:  Negative for chest pain and palpitations.  Gastrointestinal:  Negative for blood in stool, constipation and diarrhea.  Endocrine: Negative for increased urination.  Genitourinary:  Negative for involuntary urination.  Musculoskeletal:  Negative for joint pain, gait problem, joint pain, joint swelling, myalgias, muscle weakness, morning stiffness, muscle tenderness and myalgias.  Skin:  Positive for sensitivity to sunlight. Negative for color  change, rash and hair loss.  Allergic/Immunologic: Negative for susceptible to infections.  Neurological:  Negative for dizziness and headaches.  Hematological:  Negative for swollen glands.  Psychiatric/Behavioral:  Negative for depressed mood and sleep disturbance. The patient is not nervous/anxious.     PMFS History:  Patient Active Problem List   Diagnosis Date Noted   Acute pain of left knee 01/17/2021   Primary osteoarthritis of both knees 01/08/2017   Primary osteoarthritis of both feet 01/08/2017   History of pleural effusion 12/01/2016   History of recurrent pneumonia 12/01/2016   History of hyperlipidemia 12/01/2016   High risk medication use 12/01/2016   Trigger thumb, left thumb 12/01/2016   History of fracture of right hip 12/01/2016   Primary osteoarthritis of both hands 12/01/2016   Rheumatoid arthritis involving multiple joints (HCC) 04/09/2015   Paroxysmal atrial fibrillation (HCC)    Tendonitis 04/03/2015   Orthostatic syncope    Syncope 03/18/2015   Osteoporosis    Hyperlipidemia 11/14/2013    Past Medical History:  Diagnosis Date   Elevated cholesterol    Migraines    occular   Osteoporosis 2015   based on hip fracture from fall  DEXA 01/2018 T score -2.3   Pleural effusion 9/206   Bilateral effusions, s/p thora on R c/w exudate   Pneumonia 03/2015   R   RA (rheumatoid arthritis) (HCC)    Skin cancer (melanoma) (HCC)    Right Arm, nose.     Family History  Problem  Relation Age of Onset   Heart attack Father 39   Heart disease Father    Osteoporosis Sister    High Cholesterol Sister    Melanoma Daughter    Melanoma Daughter    Past Surgical History:  Procedure Laterality Date   COLONOSCOPY WITH PROPOFOL N/A 11/04/2016   Procedure: COLONOSCOPY WITH PROPOFOL;  Surgeon: Charolett Bumpers, MD;  Location: [facility];  Service: Endoscopy;  Laterality: N/A;   FINGER SURGERY     HIP ARTHROPLASTY Right 11/14/2013   Procedure: RIGHT CANNULATED HIP  PINNING;  Surgeon: Harvie Junior, MD;  Location: [facility];  Service: Orthopedics;  Laterality: Right;  BIOMET   SQUAMOUS CELL CARCINOMA EXCISION  2021   behind knee    SQUAMOUS CELL CARCINOMA EXCISION Left 11/2022   left hand   VIDEO BRONCHOSCOPY Bilateral 04/05/2015   Procedure: VIDEO BRONCHOSCOPY WITHOUT FLUORO;  Surgeon: Lupita Leash, MD;  Location: [facility];  Service: Cardiopulmonary;  Laterality: Bilateral;   Social History   Social History Narrative   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   Two children (32 and 18- both daughters) local and she has one Arboriculturist History  Administered Date(s) Administered   Influenza, High Dose Seasonal PF 04/28/2017, 04/28/2022   Influenza-Unspecified 04/23/2015, 04/08/2023   Moderna Sars-Covid-2 Vaccination 04/08/2023   PFIZER(Purple Top)SARS-COV-2 Vaccination 08/05/2019, 08/26/2019, 04/19/2020, 11/27/2020, 05/16/2021   Pneumococcal Conjugate-13 07/29/2016   Pneumococcal Polysaccharide-23 10/09/2014   Tdap 01/08/2016     Objective: Vital Signs: BP 111/71 (BP Location: Left Arm, Patient Position: Sitting, Cuff Size: Normal)   Pulse 83   Resp 16   Ht 5' 6.5" (1.689 m)   Wt 130 lb (59 kg)   LMP 07/14/1993   BMI 20.67 kg/m    Physical Exam Vitals and nursing note reviewed.  Constitutional:      Appearance: She is well-developed.  HENT:     Head: Normocephalic and atraumatic.  Eyes:     Conjunctiva/sclera: Conjunctivae normal.  Cardiovascular:     Rate and Rhythm: Normal rate and regular rhythm.     Heart sounds: Normal heart sounds.  Pulmonary:     Effort: Pulmonary effort is normal.     Breath sounds: Normal breath sounds.  Abdominal:     General: Bowel sounds are normal.     Palpations: Abdomen is soft.  Musculoskeletal:     Cervical back: Normal range of motion.  Lymphadenopathy:     Cervical: No cervical adenopathy.  Skin:    General: Skin is warm and dry.     Capillary  Refill: Capillary refill takes less than 2 seconds.  Neurological:     Mental Status: She is alert and oriented to person, place, and time.  Psychiatric:        Behavior: Behavior normal.      Musculoskeletal Exam: C-spine has limited range of motion with lateral rotation.  Thoracic and lumbar spine good range of motion.  Shoulder joints, elbow joints, wrist joints and MCPs, PIPs, DIPs have good range of motion with no synovitis.  Thickening of PIP and DIP joints.  Synovial thickening of MCP joints but no tenderness or synovitis.  Hip joints have good range of motion with no groin pain.  Knee joints have good range of motion no warmth or effusion.  Ankle joints have good range of motion with no tenderness or joint swelling.  CDAI Exam: CDAI Score: -- Patient Global: --; Provider Global: -- Swollen: --;  Tender: -- Joint Exam 09/23/2023   No joint exam has been documented for this visit   There is currently no information documented on the homunculus. Go to the Rheumatology activity and complete the homunculus joint exam.  Investigation: No additional findings.  Imaging: No results found.  Recent Labs: Lab Results  Component Value Date   WBC 5.9 07/13/2023   HGB 12.1 07/13/2023   PLT 275 07/13/2023   NA 140 07/13/2023   K 4.4 07/13/2023   CL 104 07/13/2023   CO2 31 07/13/2023   GLUCOSE 103 (H) 07/13/2023   BUN 21 07/13/2023   CREATININE 0.91 07/13/2023   BILITOT 0.3 07/13/2023   ALKPHOS 77 08/04/2019   AST 19 07/13/2023   ALT 6 07/13/2023   PROT 6.4 07/13/2023   ALBUMIN 4.5 08/04/2019   CALCIUM 9.5 07/13/2023   GFRAA 70 01/02/2021    Speciality Comments: PLQ Eye exam: 05/22/2022 WNL @ [facility]. Scheduled 10/2023 per patient. Arava started in 2017 by Dr. Dedra Skeens started 01/2017 Fosamax 2015-2018 offx 2years then resumed 2020  Procedures:  No procedures performed Allergies: Levaquin [levofloxacin] and Cephalexin     Assessment / Plan:      Visit Diagnoses: Rheumatoid arthritis involving multiple joints (HCC) -She has no joint tenderness or synovitis on examination today.  She has not had any signs or symptoms of a rheumatoid arthritis flare.  She has clinically been doing well taking Arava 10 mg 1 tablet daily and Plaquenil 200 mg 1 tablet by mouth twice daily Monday through Friday.  She is tolerating combination therapy without any side effects and has not had any recent gaps in therapy.  Patient remains today performing yard work and gardening on a regular basis without difficulty. Association of heart disease with rheumatoid arthritis was discussed. Need to monitor blood pressure, cholesterol, and to exercise 30-60 minutes on daily basis was discussed.  No medication changes will be made at this time.  She was advised to notify us if she develops signs or symptoms of a flare.  She will follow-up in the office in 5 months or sooner if needed.  Plan: hydroxychloroquine (PLAQUENIL) 200 MG tablet  High risk medication use - Arava 10 mg 1 tablet by mouth daily and Plaquenil 200 mg BID M-F.  CBC and CMP updated on 07/13/23.  Orders for CBC and CMP were released today. Lipid panel updated on 07/13/23.  PLQ Eye exam: 05/22/2022 WNL @ [facility]. Scheduled 10/2023 per patient.  No recent or recurrent infections. Discussed the importance of holding arava if she develops signs or symptoms of an infection and to resume once the infection has completely cleared.  - Plan: CBC with Differential/Platelet, COMPLETE METABOLIC PANEL WITH GFR, COMPLETE METABOLIC PANEL WITH GFR, CBC with Differential/Platelet  History of bronchiectasis - She had high-resolution CT done on May 02, 2022 by Dr. Kendrick Fries.  Patient plans on following back up with Dr. Kendrick Fries for routine evaluation.  No acute exacerbation.  No new or worsening symptoms.  Her lungs were clear to auscultation today.  Primary osteoarthritis of both hands: Synovial thickening of  MCP joints but no tenderness or synovitis noted.  PIP and DIP thickening consistent with osteoarthritis of both hands.  Complete fist formation bilaterally.  Patient remains active gardening and performing yard work on a regular basis without difficulty.  History of hyperlipidemia: Lipid panel updated on 07/13/2023.  History of fracture of right hip: No groin pain currently.    Primary osteoarthritis of both knees: No warmth  or effusion noted.  Effusion, right knee: No recurrence.  No warmth or effusion noted.  Primary osteoarthritis of both feet: Patient experiences intermittent discomfort in her feet but does not feel that it is joint related.  Yesterday she walked 3 miles and then worked on her feet all afternoon and had some increased discomfort in her left foot.  She is not experiencing any paresthesias in her feet but has some increased pain in her feet at night.  No inflammation at this time.  She is wearing proper fitting shoes.  She is vies notify us if her symptoms persist or worsen.  Other medical conditions are listed as follows:  Osteoporosis of multiple sites -She discontinued Fosamax.  Patient is currently on Prolia 60 mg subcutaneous injections every 6 months.  She is taking a calcium supplement on a daily basis.  History of pleural effusion  Paroxysmal atrial fibrillation (HCC)    Orders: Orders Placed This Encounter  Procedures   CBC with Differential/Platelet   COMPLETE METABOLIC PANEL WITH GFR   Meds ordered this encounter  Medications   leflunomide (ARAVA) 10 MG tablet    Sig: TAKE 1 TABLET(10 MG) BY MOUTH DAILY    Dispense:  90 tablet    Refill:  0   hydroxychloroquine (PLAQUENIL) 200 MG tablet    Sig: TAKE 1 TABLET BY MOUTH TWICE DAILY ON MONDAY-FRIDAY    Dispense:  120 tablet    Refill:  0    Follow-Up Instructions: Return in about 5 months (around 02/23/2024) for Rheumatoid arthritis.   Gearldine Bienenstock, PA-C  Note - This record has been created using  Dragon software.  Chart creation errors have been sought, but may not always  have been located. Such creation errors do not reflect on  the standard of medical care.

## 2023-09-22 DIAGNOSIS — L570 Actinic keratosis: Secondary | ICD-10-CM | POA: Diagnosis not present

## 2023-09-23 ENCOUNTER — Encounter: Payer: Self-pay | Admitting: Physician Assistant

## 2023-09-23 ENCOUNTER — Ambulatory Visit: Payer: PPO | Attending: Physician Assistant | Admitting: Physician Assistant

## 2023-09-23 VITALS — BP 111/71 | HR 83 | Resp 16 | Ht 66.5 in | Wt 130.0 lb

## 2023-09-23 DIAGNOSIS — Z8781 Personal history of (healed) traumatic fracture: Secondary | ICD-10-CM

## 2023-09-23 DIAGNOSIS — Z79899 Other long term (current) drug therapy: Secondary | ICD-10-CM | POA: Diagnosis not present

## 2023-09-23 DIAGNOSIS — M19042 Primary osteoarthritis, left hand: Secondary | ICD-10-CM

## 2023-09-23 DIAGNOSIS — Z8709 Personal history of other diseases of the respiratory system: Secondary | ICD-10-CM | POA: Diagnosis not present

## 2023-09-23 DIAGNOSIS — M25461 Effusion, right knee: Secondary | ICD-10-CM | POA: Diagnosis not present

## 2023-09-23 DIAGNOSIS — M19071 Primary osteoarthritis, right ankle and foot: Secondary | ICD-10-CM

## 2023-09-23 DIAGNOSIS — M069 Rheumatoid arthritis, unspecified: Secondary | ICD-10-CM

## 2023-09-23 DIAGNOSIS — M19041 Primary osteoarthritis, right hand: Secondary | ICD-10-CM

## 2023-09-23 DIAGNOSIS — M19072 Primary osteoarthritis, left ankle and foot: Secondary | ICD-10-CM

## 2023-09-23 DIAGNOSIS — M810 Age-related osteoporosis without current pathological fracture: Secondary | ICD-10-CM

## 2023-09-23 DIAGNOSIS — I480 Paroxysmal atrial fibrillation: Secondary | ICD-10-CM | POA: Diagnosis not present

## 2023-09-23 DIAGNOSIS — M170 Bilateral primary osteoarthritis of knee: Secondary | ICD-10-CM

## 2023-09-23 DIAGNOSIS — Z8639 Personal history of other endocrine, nutritional and metabolic disease: Secondary | ICD-10-CM

## 2023-09-23 MED ORDER — LEFLUNOMIDE 10 MG PO TABS: ORAL_TABLET | ORAL | 0 refills | Status: DC

## 2023-09-23 MED ORDER — HYDROXYCHLOROQUINE SULFATE 200 MG PO TABS: ORAL_TABLET | ORAL | 0 refills | Status: DC

## 2023-09-23 NOTE — Progress Notes (Signed)
 CBC WNL

## 2023-09-24 LAB — COMPLETE METABOLIC PANEL WITH GFR
AG Ratio: 2 (calc) (ref 1.0–2.5)
ALT: 12 U/L (ref 6–29)
AST: 22 U/L (ref 10–35)
Albumin: 4.2 g/dL (ref 3.6–5.1)
Alkaline phosphatase (APISO): 64 U/L (ref 37–153)
BUN: 20 mg/dL (ref 7–25)
CO2: 34 mmol/L — ABNORMAL HIGH (ref 20–32)
Calcium: 9.7 mg/dL (ref 8.6–10.4)
Chloride: 105 mmol/L (ref 98–110)
Creat: 0.79 mg/dL (ref 0.60–1.00)
Globulin: 2.1 g/dL (ref 1.9–3.7)
Glucose, Bld: 88 mg/dL (ref 65–99)
Potassium: 4.8 mmol/L (ref 3.5–5.3)
Sodium: 142 mmol/L (ref 135–146)
Total Bilirubin: 0.4 mg/dL (ref 0.2–1.2)
Total Protein: 6.3 g/dL (ref 6.1–8.1)
eGFR: 78 mL/min/{1.73_m2} (ref >=60)

## 2023-09-24 LAB — CBC WITH DIFFERENTIAL/PLATELET
Absolute Lymphocytes: 1253 {cells}/uL (ref 850–3900)
Absolute Monocytes: 610 {cells}/uL (ref 200–950)
Basophils Absolute: 49 {cells}/uL (ref 0–200)
Basophils Relative: 0.9 %
Eosinophils Absolute: 292 {cells}/uL (ref 15–500)
Eosinophils Relative: 5.4 %
HCT: 38.6 % (ref 35.0–45.0)
Hemoglobin: 12.6 g/dL (ref 11.7–15.5)
MCH: 31.3 pg (ref 27.0–33.0)
MCHC: 32.6 g/dL (ref 32.0–36.0)
MCV: 96 fL (ref 80.0–100.0)
MPV: 10.2 fL (ref 7.5–12.5)
Monocytes Relative: 11.3 %
Neutro Abs: 3197 {cells}/uL (ref 1500–7800)
Neutrophils Relative %: 59.2 %
Platelets: 284 10*3/uL (ref 140–400)
RBC: 4.02 10*6/uL (ref 3.80–5.10)
RDW: 11.4 % (ref 11.0–15.0)
Total Lymphocyte: 23.2 %
WBC: 5.4 10*3/uL (ref 3.8–10.8)

## 2023-09-24 NOTE — Progress Notes (Signed)
 CMP WNL

## 2023-10-13 DIAGNOSIS — Z79899 Other long term (current) drug therapy: Secondary | ICD-10-CM | POA: Diagnosis not present

## 2023-10-13 DIAGNOSIS — H2513 Age-related nuclear cataract, bilateral: Secondary | ICD-10-CM | POA: Diagnosis not present

## 2023-10-13 DIAGNOSIS — M069 Rheumatoid arthritis, unspecified: Secondary | ICD-10-CM | POA: Diagnosis not present

## 2023-11-04 DIAGNOSIS — Z85828 Personal history of other malignant neoplasm of skin: Secondary | ICD-10-CM | POA: Diagnosis not present

## 2023-11-04 DIAGNOSIS — D225 Melanocytic nevi of trunk: Secondary | ICD-10-CM | POA: Diagnosis not present

## 2023-11-04 DIAGNOSIS — L578 Other skin changes due to chronic exposure to nonionizing radiation: Secondary | ICD-10-CM | POA: Diagnosis not present

## 2023-11-04 DIAGNOSIS — Z85820 Personal history of malignant melanoma of skin: Secondary | ICD-10-CM | POA: Diagnosis not present

## 2023-11-04 DIAGNOSIS — L570 Actinic keratosis: Secondary | ICD-10-CM | POA: Diagnosis not present

## 2023-11-04 DIAGNOSIS — L821 Other seborrheic keratosis: Secondary | ICD-10-CM | POA: Diagnosis not present

## 2023-11-04 DIAGNOSIS — L820 Inflamed seborrheic keratosis: Secondary | ICD-10-CM | POA: Diagnosis not present

## 2023-11-04 DIAGNOSIS — L814 Other melanin hyperpigmentation: Secondary | ICD-10-CM | POA: Diagnosis not present

## 2023-12-08 ENCOUNTER — Other Ambulatory Visit: Payer: Self-pay

## 2023-12-08 MED ORDER — LEFLUNOMIDE 10 MG PO TABS: ORAL_TABLET | ORAL | 0 refills | Status: DC

## 2023-12-08 NOTE — Telephone Encounter (Signed)
 Last Fill: 09/23/2023  Labs: 09/23/2023  CBC WNL  CMP WNL   Next Visit: 02/23/2024  Last Visit: 09/23/2023  DX: Rheumatoid arthritis involving multiple joints   Current Dose per office note 09/23/2023: Arava  10 mg 1 tablet daily   Okay to refill Arava  ?

## 2023-12-24 ENCOUNTER — Other Ambulatory Visit: Payer: Self-pay | Admitting: *Deleted

## 2023-12-24 DIAGNOSIS — Z79899 Other long term (current) drug therapy: Secondary | ICD-10-CM

## 2023-12-24 LAB — COMPREHENSIVE METABOLIC PANEL WITH GFR
AG Ratio: 1.8 (calc) (ref 1.0–2.5)
ALT: 10 U/L (ref 6–29)
AST: 21 U/L (ref 10–35)
Albumin: 4.1 g/dL (ref 3.6–5.1)
Alkaline phosphatase (APISO): 72 U/L (ref 37–153)
BUN: 19 mg/dL (ref 7–25)
CO2: 30 mmol/L (ref 20–32)
Calcium: 9.7 mg/dL (ref 8.6–10.4)
Chloride: 104 mmol/L (ref 98–110)
Creat: 0.93 mg/dL (ref 0.60–1.00)
Globulin: 2.3 g/dL (ref 1.9–3.7)
Glucose, Bld: 91 mg/dL (ref 65–99)
Potassium: 4.3 mmol/L (ref 3.5–5.3)
Sodium: 143 mmol/L (ref 135–146)
Total Bilirubin: 0.5 mg/dL (ref 0.2–1.2)
Total Protein: 6.4 g/dL (ref 6.1–8.1)
eGFR: 64 mL/min/{1.73_m2} (ref >=60)

## 2023-12-24 LAB — CBC WITH DIFFERENTIAL/PLATELET
Absolute Lymphocytes: 1092 {cells}/uL (ref 850–3900)
Absolute Monocytes: 574 {cells}/uL (ref 200–950)
Basophils Absolute: 49 {cells}/uL (ref 0–200)
Basophils Relative: 0.7 %
Eosinophils Absolute: 210 {cells}/uL (ref 15–500)
Eosinophils Relative: 3 %
HCT: 40.1 % (ref 35.0–45.0)
Hemoglobin: 12.9 g/dL (ref 11.7–15.5)
MCH: 31.7 pg (ref 27.0–33.0)
MCHC: 32.2 g/dL (ref 32.0–36.0)
MCV: 98.5 fL (ref 80.0–100.0)
MPV: 9.6 fL (ref 7.5–12.5)
Monocytes Relative: 8.2 %
Neutro Abs: 5075 {cells}/uL (ref 1500–7800)
Neutrophils Relative %: 72.5 %
Platelets: 319 10*3/uL (ref 140–400)
RBC: 4.07 10*6/uL (ref 3.80–5.10)
RDW: 11.7 % (ref 11.0–15.0)
Total Lymphocyte: 15.6 %
WBC: 7 10*3/uL (ref 3.8–10.8)

## 2023-12-25 ENCOUNTER — Ambulatory Visit: Payer: Self-pay | Admitting: Rheumatology

## 2023-12-25 NOTE — Progress Notes (Signed)
 CBC and CMP are stable.

## 2023-12-28 ENCOUNTER — Other Ambulatory Visit (HOSPITAL_COMMUNITY): Payer: Self-pay | Admitting: *Deleted

## 2023-12-29 ENCOUNTER — Ambulatory Visit (HOSPITAL_COMMUNITY)
Admission: RE | Admit: 2023-12-29 | Discharge: 2023-12-29 | Disposition: A | Discharge status: Home / Self Care | Source: Ambulatory Visit | Attending: Internal Medicine | Admitting: Internal Medicine

## 2023-12-29 DIAGNOSIS — M810 Age-related osteoporosis without current pathological fracture: Secondary | ICD-10-CM | POA: Diagnosis not present

## 2023-12-29 MED ORDER — DENOSUMAB 60 MG/ML ~~LOC~~ SOSY
PREFILLED_SYRINGE | SUBCUTANEOUS | Status: AC
  Filled 2023-12-29: qty 1

## 2023-12-29 MED ORDER — DENOSUMAB 60 MG/ML ~~LOC~~ SOSY
60.0000 mg | PREFILLED_SYRINGE | Freq: Once | SUBCUTANEOUS | Status: AC
  Administered 2023-12-29: 60 mg via SUBCUTANEOUS

## 2024-01-04 ENCOUNTER — Other Ambulatory Visit: Payer: Self-pay | Admitting: Physician Assistant

## 2024-01-04 DIAGNOSIS — M069 Rheumatoid arthritis, unspecified: Secondary | ICD-10-CM

## 2024-01-04 NOTE — Telephone Encounter (Signed)
 Last Fill: 09/23/2023  Eye exam: 10/13/2023 WNL    Labs: 12/24/2023 CBC and CMP are stable.   Next Visit: 02/23/2024  Last Visit: 09/23/2023  DX: Rheumatoid arthritis involving multiple joints   Current Dose per office note 09/23/2023: Plaquenil  200 mg BID M-F.   Okay to refill Plaquenil ?

## 2024-01-13 DIAGNOSIS — L820 Inflamed seborrheic keratosis: Secondary | ICD-10-CM | POA: Diagnosis not present

## 2024-01-13 DIAGNOSIS — L570 Actinic keratosis: Secondary | ICD-10-CM | POA: Diagnosis not present

## 2024-02-09 NOTE — Progress Notes (Signed)
 Office Visit Note  Patient: [name] [name]             Date of Birth: [birth year]           MRN: [id number]             PCP: Stephane Leita DEL, MD Referring: Stephane Leita DEL, MD Visit Date: 02/23/2024 Occupation: @GUAROCC @  Subjective:  Medication management  History of Present Illness: [name] [name] is a 73 y.o. female with rheumatoid arthritis and osteoarthritis overlap.  She states she has been having some discomfort in her toes especially her bilateral big toes due to bunions.  None of the other joints are painful.  She has not noticed any joint swelling.  She continues to be on leflunomide  10 mg p.o. daily and Plaquenil  200 mg twice a day Monday to Friday.  Her Plaquenil  eye examination was in April 2025.  She has been getting Prolia  injections every 6 months through her PCP.  She has been gardening and walking and exercising on a regular basis.    Activities of Daily Living:  Patient reports morning stiffness for 0 minutes.   Patient Denies nocturnal pain.  Difficulty dressing/grooming: Denies Difficulty climbing stairs: Denies Difficulty getting out of chair: Denies Difficulty using hands for taps, buttons, cutlery, and/or writing: Denies  Review of Systems  Constitutional:  Negative for fatigue.  HENT:  Negative for mouth sores and mouth dryness.   Eyes:  Negative for dryness.  Respiratory:  Negative for shortness of breath.   Cardiovascular:  Negative for chest pain and palpitations.  Gastrointestinal:  Negative for blood in stool, constipation and diarrhea.  Endocrine: Negative for increased urination.  Genitourinary:  Negative for involuntary urination.  Musculoskeletal:  Negative for joint pain, gait problem, joint pain, joint swelling, myalgias, muscle weakness, morning stiffness, muscle tenderness and myalgias.  Skin:  Positive for color change. Negative for rash, hair loss and sensitivity to sunlight.  Allergic/Immunologic: Negative for susceptible to infections.   Neurological:  Negative for dizziness and headaches.  Hematological:  Negative for swollen glands.  Psychiatric/Behavioral:  Negative for depressed mood and sleep disturbance. The patient is not nervous/anxious.     PMFS History:  Patient Active Problem List   Diagnosis Date Noted   Acute pain of left knee 01/17/2021   Primary osteoarthritis of both knees 01/08/2017   Primary osteoarthritis of both feet 01/08/2017   History of pleural effusion 12/01/2016   History of recurrent pneumonia 12/01/2016   History of hyperlipidemia 12/01/2016   High risk medication use 12/01/2016   Trigger thumb, left thumb 12/01/2016   History of fracture of right hip 12/01/2016   Primary osteoarthritis of both hands 12/01/2016   Rheumatoid arthritis involving multiple joints (HCC) 04/09/2015   Paroxysmal atrial fibrillation (HCC)    Tendonitis 04/03/2015   Orthostatic syncope    Syncope 03/18/2015   Osteoporosis    Hyperlipidemia 11/14/2013    Past Medical History:  Diagnosis Date   Elevated cholesterol    Migraines    occular   Osteoporosis 2015   based on hip fracture from fall  DEXA 01/2018 T score -2.3   Pleural effusion 9/206   Bilateral effusions, s/p thora on R c/w exudate   Pneumonia 03/2015   R   RA (rheumatoid arthritis) (HCC)    Skin cancer (melanoma) (HCC)    Right Arm, nose.     Family History  Problem Relation Age of Onset   Heart attack Father 41   Heart disease Father  Osteoporosis Sister    High Cholesterol Sister    Melanoma Daughter    Melanoma Daughter    Past Surgical History:  Procedure Laterality Date   COLONOSCOPY WITH PROPOFOL  N/A 11/04/2016   Procedure: COLONOSCOPY WITH PROPOFOL ;  Surgeon: Gladis MARLA Louder, MD;  Location: [facility];  Service: Endoscopy;  Laterality: N/A;   FINGER SURGERY     HIP ARTHROPLASTY Right 11/14/2013   Procedure: RIGHT CANNULATED HIP PINNING;  Surgeon: Norleen LITTIE Gavel, MD;  Location: [facility];  Service: Orthopedics;  Laterality:  Right;  BIOMET   SQUAMOUS CELL CARCINOMA EXCISION  2021   behind knee    SQUAMOUS CELL CARCINOMA EXCISION Left 11/2022   left hand   VIDEO BRONCHOSCOPY Bilateral 04/05/2015   Procedure: VIDEO BRONCHOSCOPY WITHOUT FLUORO;  Surgeon: Vicenta KATHEE Lennert, MD;  Location: [facility];  Service: Cardiopulmonary;  Laterality: Bilateral;   Social History   Social History Narrative   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   Two children (32 and 30- both daughters) local and she has one Arboriculturist History  Administered Date(s) Administered   Influenza, High Dose Seasonal PF 04/28/2017, 04/28/2022   Influenza-Unspecified 04/23/2015, 04/08/2023   Moderna Sars-Covid-2 Vaccination 04/08/2023   PFIZER(Purple Top)SARS-COV-2 Vaccination 08/05/2019, 08/26/2019, 04/19/2020, 11/27/2020, 05/16/2021   Pneumococcal Conjugate-13 07/29/2016   Pneumococcal Polysaccharide-23 10/09/2014   Tdap 01/08/2016     Objective: Vital Signs: BP 124/81 (BP Location: Left Arm, Patient Position: Sitting, Cuff Size: Normal)   Pulse 71   Resp 14   Ht 5' 6 (1.676 m)   Wt 129 lb (58.5 kg)   LMP 07/14/1993   BMI 20.82 kg/m    Physical Exam Vitals and nursing note reviewed.  Constitutional:      Appearance: She is well-developed.  HENT:     Head: Normocephalic and atraumatic.  Eyes:     Conjunctiva/sclera: Conjunctivae normal.  Cardiovascular:     Rate and Rhythm: Normal rate and regular rhythm.     Heart sounds: Normal heart sounds.  Pulmonary:     Effort: Pulmonary effort is normal.     Breath sounds: Normal breath sounds.  Abdominal:     General: Bowel sounds are normal.     Palpations: Abdomen is soft.  Musculoskeletal:     Cervical back: Normal range of motion.  Lymphadenopathy:     Cervical: No cervical adenopathy.  Skin:    General: Skin is warm and dry.     Capillary Refill: Capillary refill takes less than 2 seconds.  Neurological:     Mental Status: She is  alert and oriented to person, place, and time.  Psychiatric:        Behavior: Behavior normal.      Musculoskeletal Exam: She had limited range of motion of the cervical spine without discomfort.  There was no tenderness over thoracic or lumbar spine.  Shoulder joints, elbow joints, wrist joints, MCPs PIPs and DIPs with good range of motion.  Bilateral MCP, PIP and DIP thickening was noted with no synovitis.  Hip joints and knee joints were in good range of motion without any warmth swelling or effusion.  There was no tenderness over her ankles.  Bilateral bunions were noted.  CDAI Exam: CDAI Score: -- Patient Global: --; Provider Global: -- Swollen: --; Tender: -- Joint Exam 02/23/2024   No joint exam has been documented for this visit   There is currently no information documented on the homunculus. Go to the  Rheumatology activity and complete the homunculus joint exam.  Investigation: No additional findings.  Imaging: No results found.  Recent Labs: Lab Results  Component Value Date   WBC 7.0 12/24/2023   HGB 12.9 12/24/2023   PLT 319 12/24/2023   NA 143 12/24/2023   K 4.3 12/24/2023   CL 104 12/24/2023   CO2 30 12/24/2023   GLUCOSE 91 12/24/2023   BUN 19 12/24/2023   CREATININE 0.93 12/24/2023   BILITOT 0.5 12/24/2023   ALKPHOS 77 08/04/2019   AST 21 12/24/2023   ALT 10 12/24/2023   PROT 6.4 12/24/2023   ALBUMIN 4.5 08/04/2019   CALCIUM 9.7 12/24/2023   GFRAA 70 01/02/2021    Speciality Comments: PLQ Eye exam: 10/13/2023 WNL @ [facility]. Scheduled 10/2023 per patient. Arava  started in 2017 by Dr. Everlean BAKER started 01/2017 Fosamax  2015-2018 offx 2years then resumed 2020  Procedures:  No procedures performed Allergies: Levaquin  [levofloxacin ] and Cephalexin   Assessment / Plan:     Visit Diagnoses: Rheumatoid arthritis involving multiple joints (HCC) - Positive RF, positive anti-CCP, negative ANA, elevated sed rate: Patient denies having a  flare of rheumatoid arthritis.  No synovitis was noted on the examination.  She has been experiencing some discomfort in her feet especially over the bunions.  No synovitis was noted over the MTPs.  High risk medication use - Leflunomide  10 mg p.o. daily, Plaquenil  200 mg twice daily Monday to Friday.  Eye exam October 13, 2023.  December 24, 2023 CBC and CMP were normal.  She was advised to get repeat labs in September and then every 3 months.  Information reimmunization was placed in the AVS.  She was advised to hold leflunomide  if she develops an infection resume after the infection resolves.  Primary osteoarthritis of both hands-she had no synovitis on the examination.  Bilateral PIP and DIP thickening was noted.  Joint protection was discussed.  History of fracture of right hip-she had good range of motion without any discomfort.  Effusion, right knee-no effusion noted today.  Primary osteoarthritis of both knees-she continues to have some discomfort in her knee joints.  Primary osteoarthritis of both feet-she had bilateral bunions.  Use of toe separator and tight fitting shoes were advised.  Plantar fasciitis, left-currently not symptomatic.  Osteoporosis of multiple sites - Treated with Prolia  in the past.  Now she is on Prolia  60 mg subcu every 6 months.  Last Prolia  injection was on December 29, 2023.  Other medical problems are listed as follows:  History of pleural effusion  History of bronchiectasis - HRCT October 2023 by Dr. Alaine.  Patient will follow-up with Dr. Alaine.  History of hyperlipidemia-she is on rosuvastatin.  Paroxysmal atrial fibrillation (HCC)  Orders: No orders of the defined types were placed in this encounter.  No orders of the defined types were placed in this encounter.    Follow-Up Instructions: Return in about 5 months (around 07/25/2024) for Rheumatoid arthritis, Osteoarthritis.   Maya Nash, MD  Note - This record has been created using  Animal nutritionist.  Chart creation errors have been sought, but may not always  have been located. Such creation errors do not reflect on  the standard of medical care.

## 2024-02-23 ENCOUNTER — Ambulatory Visit: Attending: Rheumatology | Admitting: Rheumatology

## 2024-02-23 ENCOUNTER — Encounter: Payer: Self-pay | Admitting: Rheumatology

## 2024-02-23 VITALS — BP 124/81 | HR 71 | Resp 14 | Ht 66.0 in | Wt 129.0 lb

## 2024-02-23 DIAGNOSIS — M19042 Primary osteoarthritis, left hand: Secondary | ICD-10-CM

## 2024-02-23 DIAGNOSIS — M19071 Primary osteoarthritis, right ankle and foot: Secondary | ICD-10-CM

## 2024-02-23 DIAGNOSIS — Z8781 Personal history of (healed) traumatic fracture: Secondary | ICD-10-CM

## 2024-02-23 DIAGNOSIS — Z79899 Other long term (current) drug therapy: Secondary | ICD-10-CM | POA: Diagnosis not present

## 2024-02-23 DIAGNOSIS — M069 Rheumatoid arthritis, unspecified: Secondary | ICD-10-CM | POA: Diagnosis not present

## 2024-02-23 DIAGNOSIS — M19041 Primary osteoarthritis, right hand: Secondary | ICD-10-CM | POA: Diagnosis not present

## 2024-02-23 DIAGNOSIS — Z8639 Personal history of other endocrine, nutritional and metabolic disease: Secondary | ICD-10-CM

## 2024-02-23 DIAGNOSIS — M722 Plantar fascial fibromatosis: Secondary | ICD-10-CM

## 2024-02-23 DIAGNOSIS — M170 Bilateral primary osteoarthritis of knee: Secondary | ICD-10-CM | POA: Diagnosis not present

## 2024-02-23 DIAGNOSIS — M810 Age-related osteoporosis without current pathological fracture: Secondary | ICD-10-CM

## 2024-02-23 DIAGNOSIS — I480 Paroxysmal atrial fibrillation: Secondary | ICD-10-CM

## 2024-02-23 DIAGNOSIS — M25461 Effusion, right knee: Secondary | ICD-10-CM

## 2024-02-23 DIAGNOSIS — Z8709 Personal history of other diseases of the respiratory system: Secondary | ICD-10-CM

## 2024-02-23 DIAGNOSIS — Z1231 Encounter for screening mammogram for malignant neoplasm of breast: Secondary | ICD-10-CM | POA: Diagnosis not present

## 2024-02-23 DIAGNOSIS — M19072 Primary osteoarthritis, left ankle and foot: Secondary | ICD-10-CM | POA: Diagnosis not present

## 2024-02-23 NOTE — Patient Instructions (Addendum)
 Standing Labs We placed an order today for your standing lab work.   Please have your standing labs drawn in September and every 3 months  Please have your labs drawn 2 weeks prior to your appointment so that the provider can discuss your lab results at your appointment, if possible.  Please note that you may see your imaging and lab results in MyChart before we have reviewed them. We will contact you once all results are reviewed. Please allow our office up to 72 hours to thoroughly review all of the results before contacting the office for clarification of your results.  WALK-IN LAB HOURS  Monday through Thursday from 8:00 am -12:30 pm and 1:00 pm-4:30 pm and Friday from 8:00 am-12:00 pm.  Patients with office visits requiring labs will be seen before walk-in labs.  You may encounter longer than normal wait times. Please allow additional time. Wait times may be shorter on  Monday and Thursday afternoons.  We do not book appointments for walk-in labs. We appreciate your patience and understanding with our staff.   Labs are drawn by [facility]. Please bring your co-pay at the time of your lab draw.  You may receive a bill from [facility] for your lab work.  Please note if you are on Hydroxychloroquine  and and an order has been placed for a Hydroxychloroquine  level,  you will need to have it drawn 4 hours or more after your last dose.  If you wish to have your labs drawn at another location, please call the office 24 hours in advance so we can fax the orders.  The office is located at [address], [place], [place] [address]   If you have any questions regarding directions or hours of operation,  please call [phone].   As a reminder, please drink plenty of water prior to coming for your lab work. Thanks!   Vaccines You are taking a medication(s) that can suppress your immune system.  The following immunizations are recommended: Flu annually Covid-19  Td/Tdap (tetanus,  diphtheria, pertussis) every 10 years Pneumonia (Prevnar 15 then Pneumovax 23 at least 1 year apart.  Alternatively, can take Prevnar 20 without needing additional dose) Shingrix: 2 doses from 4 weeks to 6 months apart  Please check with your PCP to make sure you are up to date.   If you have signs or symptoms of an infection or start antibiotics: First, call your PCP for workup of your infection. Hold your medication through the infection, until you complete your antibiotics, and until symptoms resolve if you take the following: Injectable medication (Actemra, Benlysta, Cimzia, Cosentyx, Enbrel, Humira, Kevzara, Orencia, Remicade, Simponi, Stelara, Taltz, Tremfya) Methotrexate Leflunomide  (Arava ) Mycophenolate (Cellcept) Earma Jewel, or Rinvoq   Cervical Strain and Sprain Rehab Ask your health care provider which exercises are safe for you. Do exercises exactly as told by your health care provider and adjust them as directed. It is normal to feel mild stretching, pulling, tightness, or discomfort as you do these exercises. Stop right away if you feel sudden pain or your pain gets worse. Do not begin these exercises until told by your health care provider. Stretching and range-of-motion exercises Cervical side bending  Using good posture, sit on a stable chair or stand up. Without moving your shoulders, slowly tilt your left / right ear to your shoulder until you feel a stretch in the neck muscles on the opposite side. You should be looking straight ahead. Hold for __________ seconds. Repeat with the other  side of your neck. Repeat __________ times. Complete this exercise __________ times a day. Cervical rotation  Using good posture, sit on a stable chair or stand up. Slowly turn your head to the side as if you are looking over your left / right shoulder. Keep your eyes level with the ground. Stop when you feel a stretch along the side and the back of your neck. Hold for  __________ seconds. Repeat this by turning to your other side. Repeat __________ times. Complete this exercise __________ times a day. Thoracic extension and pectoral stretch  Roll a towel or a small blanket so it is about 4 inches (10 cm) in diameter. Lie down on your back on a firm surface. Put the towel in the middle of your back across your spine. It should not be under your shoulder blades. Put your hands behind your head and let your elbows fall out to your sides. Hold for __________ seconds. Repeat __________ times. Complete this exercise __________ times a day. Strengthening exercises Upper cervical flexion  Lie on your back with a thin pillow behind your head or a small, rolled-up towel under your neck. Gently tuck your chin toward your chest and nod your head down to look toward your feet. Do not lift your head off the pillow. Hold for __________ seconds. Release the tension slowly. Relax your neck muscles completely before you repeat this exercise. Repeat __________ times. Complete this exercise __________ times a day. Cervical extension  Stand about 6 inches (15 cm) away from a wall, with your back facing the wall. Place a soft object, about 6-8 inches (15-20 cm) in diameter, between the back of your head and the wall. A soft object could be a small pillow, a ball, or a folded towel. Gently tilt your head back and press into the soft object. Keep your jaw and forehead relaxed. Hold for __________ seconds. Release the tension slowly. Relax your neck muscles completely before you repeat this exercise. Repeat __________ times. Complete this exercise __________ times a day. Posture and body mechanics Body mechanics refer to the movements and positions of your body while you do your daily activities. Posture is part of body mechanics. Good posture and healthy body mechanics can help to relieve stress in your body's tissues and joints. Good posture means that your spine is in its  natural S-curve position (your spine is neutral), your shoulders are pulled back slightly, and your head is not tipped forward. The following are general guidelines for using improved posture and body mechanics in your everyday activities. Sitting  When sitting, keep your spine neutral and keep your feet flat on the floor. Use a footrest, if needed, and keep your thighs parallel to the floor. Avoid rounding your shoulders. Avoid tilting your head forward. When working at a desk or a computer, keep your desk at a height where your hands are slightly lower than your elbows. Slide your chair under your desk so you are close enough to maintain good posture. When working at a computer, place your monitor at a height where you are looking straight ahead and you do not have to tilt your head forward or downward to look at the screen. Standing  When standing, keep your spine neutral and keep your feet about hip-width apart. Keep a slight bend in your knees. Your ears, shoulders, and hips should line up. When you do a task in which you stand in one place for a long time, place one foot up on a  stable object that is 2-4 inches (5-10 cm) high, such as a footstool. This helps keep your spine neutral. Resting When lying down and resting, avoid positions that are most painful for you. Try to support your neck in a neutral position. You can use a contour pillow or a small rolled-up towel. Your pillow should support your neck but not push on it. This information is not intended to replace advice given to you by your health care provider. Make sure you discuss any questions you have with your health care provider. Document Revised: 11/03/2022 Document Reviewed: 01/20/2022 Elsevier Patient Education  2024 [employer].

## 2024-02-29 DIAGNOSIS — M059 Rheumatoid arthritis with rheumatoid factor, unspecified: Secondary | ICD-10-CM | POA: Diagnosis not present

## 2024-02-29 DIAGNOSIS — J479 Bronchiectasis, uncomplicated: Secondary | ICD-10-CM | POA: Diagnosis not present

## 2024-04-01 ENCOUNTER — Other Ambulatory Visit: Payer: Self-pay | Admitting: *Deleted

## 2024-04-01 DIAGNOSIS — Z79899 Other long term (current) drug therapy: Secondary | ICD-10-CM

## 2024-04-02 LAB — CBC WITH DIFFERENTIAL/PLATELET
Absolute Lymphocytes: 1172 {cells}/uL (ref 850–3900)
Absolute Monocytes: 490 {cells}/uL (ref 200–950)
Basophils Absolute: 61 {cells}/uL (ref 0–200)
Basophils Relative: 1.1 %
Eosinophils Absolute: 182 {cells}/uL (ref 15–500)
Eosinophils Relative: 3.3 %
HCT: 37.6 % (ref 35.0–45.0)
Hemoglobin: 12.3 g/dL (ref 11.7–15.5)
MCH: 32.1 pg (ref 27.0–33.0)
MCHC: 32.7 g/dL (ref 32.0–36.0)
MCV: 98.2 fL (ref 80.0–100.0)
MPV: 10 fL (ref 7.5–12.5)
Monocytes Relative: 8.9 %
Neutro Abs: 3597 {cells}/uL (ref 1500–7800)
Neutrophils Relative %: 65.4 %
Platelets: 346 Thousand/uL (ref 140–400)
RBC: 3.83 Million/uL (ref 3.80–5.10)
RDW: 11.7 % (ref 11.0–15.0)
Total Lymphocyte: 21.3 %
WBC: 5.5 Thousand/uL (ref 3.8–10.8)

## 2024-04-02 LAB — COMPREHENSIVE METABOLIC PANEL WITH GFR
AG Ratio: 1.5 (calc) (ref 1.0–2.5)
ALT: 7 U/L (ref 6–29)
AST: 20 U/L (ref 10–35)
Albumin: 4 g/dL (ref 3.6–5.1)
Alkaline phosphatase (APISO): 65 U/L (ref 37–153)
BUN: 15 mg/dL (ref 7–25)
CO2: 30 mmol/L (ref 20–32)
Calcium: 9.6 mg/dL (ref 8.6–10.4)
Chloride: 105 mmol/L (ref 98–110)
Creat: 0.79 mg/dL (ref 0.60–1.00)
Globulin: 2.6 g/dL (ref 1.9–3.7)
Glucose, Bld: 103 mg/dL — ABNORMAL HIGH (ref 65–99)
Potassium: 4.9 mmol/L (ref 3.5–5.3)
Sodium: 144 mmol/L (ref 135–146)
Total Bilirubin: 0.4 mg/dL (ref 0.2–1.2)
Total Protein: 6.6 g/dL (ref 6.1–8.1)
eGFR: 78 mL/min/1.73m2 (ref >=60)

## 2024-04-04 ENCOUNTER — Other Ambulatory Visit: Payer: Self-pay | Admitting: Physician Assistant

## 2024-04-04 ENCOUNTER — Ambulatory Visit: Payer: Self-pay | Admitting: Rheumatology

## 2024-04-04 DIAGNOSIS — M069 Rheumatoid arthritis, unspecified: Secondary | ICD-10-CM

## 2024-04-04 NOTE — Telephone Encounter (Signed)
 Please schedule patient a follow up visit. Patient due 07/25/2024. Thanks!

## 2024-04-04 NOTE — Telephone Encounter (Signed)
 Last Fill: 01/04/2024  Eye exam: 10/13/2023 WNL   Labs: 04/01/2024  Glucose 103  Next Visit: Due 07/26/2023. Message sent to the front to schedule.   Last Visit: 02/23/2024  IK:Myzlfjunpi arthritis involving multiple joints (HCC)   Current Dose per office note 02/23/2024: Plaquenil  200 mg twice daily Monday to Friday.   Okay to refill Plaquenil ?

## 2024-04-04 NOTE — Telephone Encounter (Signed)
 Pt is already scheduled for 07/25/24

## 2024-04-05 DIAGNOSIS — Z23 Encounter for immunization: Secondary | ICD-10-CM | POA: Diagnosis not present

## 2024-04-05 DIAGNOSIS — M810 Age-related osteoporosis without current pathological fracture: Secondary | ICD-10-CM | POA: Diagnosis not present

## 2024-04-05 DIAGNOSIS — E785 Hyperlipidemia, unspecified: Secondary | ICD-10-CM | POA: Diagnosis not present

## 2024-04-12 DIAGNOSIS — C439 Malignant melanoma of skin, unspecified: Secondary | ICD-10-CM | POA: Diagnosis not present

## 2024-04-12 DIAGNOSIS — E785 Hyperlipidemia, unspecified: Secondary | ICD-10-CM | POA: Diagnosis not present

## 2024-04-12 DIAGNOSIS — M810 Age-related osteoporosis without current pathological fracture: Secondary | ICD-10-CM | POA: Diagnosis not present

## 2024-04-12 DIAGNOSIS — M0510 Rheumatoid lung disease with rheumatoid arthritis of unspecified site: Secondary | ICD-10-CM | POA: Diagnosis not present

## 2024-04-12 DIAGNOSIS — Z1339 Encounter for screening examination for other mental health and behavioral disorders: Secondary | ICD-10-CM | POA: Diagnosis not present

## 2024-04-12 DIAGNOSIS — J479 Bronchiectasis, uncomplicated: Secondary | ICD-10-CM | POA: Diagnosis not present

## 2024-04-12 DIAGNOSIS — Z0000 Encounter for general adult medical examination without abnormal findings: Secondary | ICD-10-CM | POA: Diagnosis not present

## 2024-04-12 DIAGNOSIS — Z1331 Encounter for screening for depression: Secondary | ICD-10-CM | POA: Diagnosis not present

## 2024-04-12 DIAGNOSIS — L03113 Cellulitis of right upper limb: Secondary | ICD-10-CM | POA: Diagnosis not present

## 2024-04-12 DIAGNOSIS — M069 Rheumatoid arthritis, unspecified: Secondary | ICD-10-CM | POA: Diagnosis not present

## 2024-04-12 DIAGNOSIS — Z85828 Personal history of other malignant neoplasm of skin: Secondary | ICD-10-CM | POA: Diagnosis not present

## 2024-05-17 ENCOUNTER — Other Ambulatory Visit: Payer: Self-pay | Admitting: Physician Assistant

## 2024-05-17 DIAGNOSIS — M069 Rheumatoid arthritis, unspecified: Secondary | ICD-10-CM

## 2024-05-18 ENCOUNTER — Other Ambulatory Visit: Payer: Self-pay

## 2024-05-18 MED ORDER — LEFLUNOMIDE 10 MG PO TABS: ORAL_TABLET | ORAL | 0 refills | Status: DC

## 2024-05-18 NOTE — Telephone Encounter (Signed)
 Refill request received via fax from [facility] [address] for Arava    Last Fill: 12/08/2023  Labs: 04/01/2024 Glucose 103  Next Visit: 07/25/2024  Last Visit: 02/23/2024  DX: Rheumatoid arthritis involving multiple joints (HCC)   Current Dose per office note 02/23/2024: Leflunomide  10 mg p.o. daily   Okay to refill Arava  ?

## 2024-05-30 DIAGNOSIS — M0510 Rheumatoid lung disease with rheumatoid arthritis of unspecified site: Secondary | ICD-10-CM | POA: Diagnosis not present

## 2024-05-30 DIAGNOSIS — R911 Solitary pulmonary nodule: Secondary | ICD-10-CM | POA: Diagnosis not present

## 2024-05-30 DIAGNOSIS — I2510 Atherosclerotic heart disease of native coronary artery without angina pectoris: Secondary | ICD-10-CM | POA: Diagnosis not present

## 2024-05-30 DIAGNOSIS — J479 Bronchiectasis, uncomplicated: Secondary | ICD-10-CM | POA: Diagnosis not present

## 2024-05-30 DIAGNOSIS — M069 Rheumatoid arthritis, unspecified: Secondary | ICD-10-CM | POA: Diagnosis not present

## 2024-05-30 DIAGNOSIS — E785 Hyperlipidemia, unspecified: Secondary | ICD-10-CM | POA: Diagnosis not present

## 2024-06-29 ENCOUNTER — Other Ambulatory Visit: Payer: Self-pay | Admitting: *Deleted

## 2024-06-29 DIAGNOSIS — Z79899 Other long term (current) drug therapy: Secondary | ICD-10-CM

## 2024-06-29 LAB — COMPREHENSIVE METABOLIC PANEL WITH GFR
AG Ratio: 2 (calc) (ref 1.0–2.5)
ALT: 10 U/L (ref 6–29)
AST: 23 U/L (ref 10–35)
Albumin: 4.4 g/dL (ref 3.6–5.1)
Alkaline phosphatase (APISO): 66 U/L (ref 37–153)
BUN/Creatinine Ratio: 16 (calc) (ref 6–22)
BUN: 18 mg/dL (ref 7–25)
CO2: 31 mmol/L (ref 20–32)
Calcium: 10.1 mg/dL (ref 8.6–10.4)
Chloride: 101 mmol/L (ref 98–110)
Creat: 1.14 mg/dL — ABNORMAL HIGH (ref 0.60–1.00)
Globulin: 2.2 g/dL (ref 1.9–3.7)
Glucose, Bld: 99 mg/dL (ref 65–99)
Potassium: 4.7 mmol/L (ref 3.5–5.3)
Sodium: 140 mmol/L (ref 135–146)
Total Bilirubin: 0.5 mg/dL (ref 0.2–1.2)
Total Protein: 6.6 g/dL (ref 6.1–8.1)
eGFR: 50 mL/min/1.73m2 — ABNORMAL LOW (ref >=60)

## 2024-06-29 LAB — CBC WITH DIFFERENTIAL/PLATELET
Absolute Lymphocytes: 1598 {cells}/uL (ref 850–3900)
Absolute Monocytes: 576 {cells}/uL (ref 200–950)
Basophils Absolute: 101 {cells}/uL (ref 0–200)
Basophils Relative: 1.4 %
Eosinophils Absolute: 238 {cells}/uL (ref 15–500)
Eosinophils Relative: 3.3 %
HCT: 40.7 % (ref 35.9–46.0)
Hemoglobin: 13.1 g/dL (ref 11.7–15.5)
MCH: 31.7 pg (ref 27.0–33.0)
MCHC: 32.2 g/dL (ref 31.6–35.4)
MCV: 98.5 fL (ref 81.4–101.7)
MPV: 10.2 fL (ref 7.5–12.5)
Monocytes Relative: 8 %
Neutro Abs: 4687 {cells}/uL (ref 1500–7800)
Neutrophils Relative %: 65.1 %
Platelets: 288 Thousand/uL (ref 140–400)
RBC: 4.13 Million/uL (ref 3.80–5.10)
RDW: 11.7 % (ref 11.0–15.0)
Total Lymphocyte: 22.2 %
WBC: 7.2 Thousand/uL (ref 3.8–10.8)

## 2024-06-30 ENCOUNTER — Ambulatory Visit: Payer: Self-pay | Admitting: Rheumatology

## 2024-06-30 DIAGNOSIS — Z79899 Other long term (current) drug therapy: Secondary | ICD-10-CM

## 2024-06-30 NOTE — Progress Notes (Signed)
 CBC and CMP are stable except creatinine is elevated.  Patient should avoid all NSAIDs and increase water intake.  I would recommend repeat BMP in 1 month.

## 2024-07-04 ENCOUNTER — Other Ambulatory Visit: Payer: Self-pay | Admitting: Physician Assistant

## 2024-07-04 ENCOUNTER — Other Ambulatory Visit: Payer: Self-pay | Admitting: Rheumatology

## 2024-07-04 DIAGNOSIS — M069 Rheumatoid arthritis, unspecified: Secondary | ICD-10-CM

## 2024-07-04 NOTE — Telephone Encounter (Signed)
 ref

## 2024-07-04 NOTE — Telephone Encounter (Signed)
 Last Fill: 04/04/2024  Eye exam: 10/13/2023 WNL   Labs: 06/29/2024 CBC and CMP are stable except creatinine is elevated. Patient should avoid all NSAIDs and increase water intake. I would recommend repeat BMP in 1 month.   Next Visit: 07/25/2024  Last Visit: 02/23/2024  DX: Rheumatoid arthritis involving multiple joints   Current Dose per office note 02/23/2024: Plaquenil  200 mg twice daily Monday to Friday   Okay to refill Plaquenil ?

## 2024-07-11 NOTE — Progress Notes (Signed)
 "  Office Visit Note  Patient: [name] [name]             Date of Birth: [birth year]           MRN: [id number]             PCP: Stephane Leita DEL, MD Referring: Stephane Leita DEL, MD Visit Date: 07/25/2024 Occupation: Data Unavailable  Subjective:  Elevated LP(a)-Follows with cardiology   History of Present Illness: [name] [name] is a 73 y.o. female with history of rheumatoid arthritis and osteoarthritis.  Patient remains on Leflunomide  10 mg p.o. daily and Plaquenil  200 mg twice daily Monday to Friday.  She is tolerating combination therapy without any side effects and has not had any gaps in therapy.  She denies any signs or symptoms of a rheumatoid arthritis flare.  She has not had any morning stiffness, nocturnal pain, or difficulty performing ADLs. She denies any recurrent infections. Patient states that she had previously been on low-dose statins for many years.  Patient states that she had a calcium score calculated and additional lab work obtained. She was found to have an elevated Lpa. She has established care with Dr. Francyne and then Dr. Mona most recently.  She will be starting on repatha  and has been taking aspirin  81 mg daily as advised.    Activities of Daily Living:  Patient reports morning stiffness for 0 minutes Patient Denies nocturnal pain.  Difficulty dressing/grooming: Denies Difficulty climbing stairs: Denies Difficulty getting out of chair: Denies Difficulty using hands for taps, buttons, cutlery, and/or writing: Denies  Review of Systems  Constitutional:  Negative for fatigue.  HENT:  Negative for mouth sores, mouth dryness and nose dryness.   Eyes:  Negative for pain, visual disturbance and dryness.  Respiratory:  Negative for cough, hemoptysis, shortness of breath and difficulty breathing.   Cardiovascular:  Negative for chest pain, palpitations, hypertension and swelling in legs/feet.  Gastrointestinal:  Negative for blood in stool, constipation and diarrhea.   Endocrine: Negative for increased urination.  Genitourinary:  Negative for painful urination.  Musculoskeletal:  Negative for joint pain, joint pain, joint swelling, myalgias, muscle weakness, morning stiffness, muscle tenderness and myalgias.  Skin:  Negative for color change, pallor, rash, hair loss, nodules/bumps, skin tightness, ulcers and sensitivity to sunlight.  Allergic/Immunologic: Negative for susceptible to infections.  Neurological:  Negative for dizziness, numbness, headaches and weakness.  Hematological:  Negative for swollen glands.  Psychiatric/Behavioral:  Negative for depressed mood and sleep disturbance. The patient is nervous/anxious.     PMFS History:  Patient Active Problem List   Diagnosis Date Noted   Acute pain of left knee 01/17/2021   Primary osteoarthritis of both knees 01/08/2017   Primary osteoarthritis of both feet 01/08/2017   History of pleural effusion 12/01/2016   History of recurrent pneumonia 12/01/2016   History of hyperlipidemia 12/01/2016   High risk medication use 12/01/2016   Trigger thumb, left thumb 12/01/2016   History of fracture of right hip 12/01/2016   Primary osteoarthritis of both hands 12/01/2016   Rheumatoid arthritis involving multiple joints (HCC) 04/09/2015   Paroxysmal atrial fibrillation (HCC)    Tendonitis 04/03/2015   Orthostatic syncope    Syncope 03/18/2015   Osteoporosis    Hyperlipidemia 11/14/2013    Past Medical History:  Diagnosis Date   Elevated cholesterol    Migraines    occular   Osteoporosis 2015   based on hip fracture from fall  DEXA 01/2018 T score -2.3   Pleural  effusion 9/206   Bilateral effusions, s/p thora on R c/w exudate   Pneumonia 03/2015   R   RA (rheumatoid arthritis) (HCC)    Skin cancer (melanoma) (HCC)    Right Arm, nose.     Family History  Problem Relation Age of Onset   Heart attack Father 35   Heart disease Father    Osteoporosis Sister    High Cholesterol Sister    Melanoma  Daughter    Melanoma Daughter    Past Surgical History:  Procedure Laterality Date   COLONOSCOPY WITH PROPOFOL  N/A 11/04/2016   Procedure: COLONOSCOPY WITH PROPOFOL ;  Surgeon: Gladis MARLA Louder, MD;  Location: [facility];  Service: Endoscopy;  Laterality: N/A;   FINGER SURGERY     HIP ARTHROPLASTY Right 11/14/2013   Procedure: RIGHT CANNULATED HIP PINNING;  Surgeon: Norleen LITTIE Gavel, MD;  Location: [facility];  Service: Orthopedics;  Laterality: Right;  BIOMET   SQUAMOUS CELL CARCINOMA EXCISION  2021   behind knee    SQUAMOUS CELL CARCINOMA EXCISION Left 11/2022   left hand   VIDEO BRONCHOSCOPY Bilateral 04/05/2015   Procedure: VIDEO BRONCHOSCOPY WITHOUT FLUORO;  Surgeon: Vicenta KATHEE Lennert, MD;  Location: [facility];  Service: Cardiopulmonary;  Laterality: Bilateral;   Social History[1] Social History   Social History Narrative   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   Two children (32 and 58- both daughters) local and she has one Sports Coach History  Administered Date(s) Administered   INFLUENZA, HIGH DOSE SEASONAL PF 04/28/2017, 04/28/2022   Influenza-Unspecified 04/23/2015, 04/08/2023   Moderna Sars-Covid-2 Vaccination 04/08/2023   PFIZER(Purple Top)SARS-COV-2 Vaccination 08/05/2019, 08/26/2019, 04/19/2020, 11/27/2020, 05/16/2021   Pneumococcal Conjugate-13 07/29/2016   Pneumococcal Polysaccharide-23 10/09/2014   Tdap 01/08/2016     Objective: Vital Signs: BP 131/80   Pulse 87   Temp 98.3 F (36.8 C)   Resp 14   Ht 5' 6 (1.676 m)   Wt 123 lb 9.6 oz (56.1 kg)   LMP 07/14/1993   BMI 19.95 kg/m    Physical Exam Vitals and nursing note reviewed.  Constitutional:      Appearance: She is well-developed.  HENT:     Head: Normocephalic and atraumatic.  Eyes:     Conjunctiva/sclera: Conjunctivae normal.  Cardiovascular:     Rate and Rhythm: Normal rate and regular rhythm.     Heart sounds: Normal heart sounds.  Pulmonary:      Effort: Pulmonary effort is normal.     Breath sounds: Normal breath sounds.  Abdominal:     General: Bowel sounds are normal.     Palpations: Abdomen is soft.  Musculoskeletal:     Cervical back: Normal range of motion.  Lymphadenopathy:     Cervical: No cervical adenopathy.  Skin:    General: Skin is warm and dry.     Capillary Refill: Capillary refill takes less than 2 seconds.  Neurological:     Mental Status: She is alert and oriented to person, place, and time.  Psychiatric:        Behavior: Behavior normal.      Musculoskeletal Exam: C-spine, thoracic spine, lumbar spine have good range of motion.  No midline spinal tenderness.  No SI joint tenderness.  Shoulder joints, elbow joints, wrist joints, MCPs, PIPs, DIPs have good range of motion with no synovitis.  PIP and DIP thickening consistent with osteoarthritis of both hands.  Complete fist formation bilaterally.  Hip joints have  good range of motion with no groin pain.  Knee joints have good range of motion no warmth or effusion.  Ankle joints have good range of motion no tenderness or joint swelling.  No evidence of Achilles tendinitis or plantar fasciitis.   CDAI Exam: CDAI Score: -- Patient Global: --; Provider Global: -- Swollen: --; Tender: -- Joint Exam 07/25/2024   No joint exam has been documented for this visit   There is currently no information documented on the homunculus. Go to the Rheumatology activity and complete the homunculus joint exam.  Investigation: No additional findings.  Imaging: No results found.  Recent Labs: Lab Results  Component Value Date   WBC 7.2 06/29/2024   HGB 13.1 06/29/2024   PLT 288 06/29/2024   NA 140 06/29/2024   K 4.7 06/29/2024   CL 101 06/29/2024   CO2 31 06/29/2024   GLUCOSE 99 06/29/2024   BUN 18 06/29/2024   CREATININE 1.14 (H) 06/29/2024   BILITOT 0.5 06/29/2024   ALKPHOS 77 08/04/2019   AST 23 06/29/2024   ALT 10 06/29/2024   PROT 6.6 06/29/2024    ALBUMIN 4.5 08/04/2019   CALCIUM 10.1 06/29/2024   GFRAA 70 01/02/2021    Speciality Comments: PLQ Eye exam: 10/13/2023 WNL @ [place]   Arava  started in 2017 by Dr. Everlean BAKER started 01/2017 Fosamax  2015-2018 offx 2years then resumed 2020  Procedures:  No procedures performed Allergies: Levaquin  [levofloxacin ] and Cephalexin   Assessment / Plan:     Visit Diagnoses: Rheumatoid arthritis involving multiple joints (HCC) - Positive RF, positive anti-CCP, negative ANA, elevated sed rate: She has no synovitis on examination today.  She has not had any signs or symptoms of a rheumatoid arthritis flare.  She has clinically been doing well taking Arava  10 mg 1 tablet by mouth daily and Plaquenil  200 mg 1 tablet by mouth twice daily Monday through Friday.  She is tolerating combination therapy without any side effects and has not had any gaps in therapy.  No medication changes will be made at this time.  She was advised to notify us  if she develops signs or symptoms of a flare. Association of heart disease with rheumatoid arthritis was discussed. Need to monitor blood pressure, cholesterol, and to exercise 30-60 minutes on daily basis was discussed.  Patient has established care with cardiology and will be starting on Repatha .  She has also been taking aspirin  81 mg daily. She will follow-up in the office in 5 months or sooner if needed.  High risk medication use - Arava  10 mg p.o. daily, Plaquenil  200 mg twice daily Monday to Friday.   CBC and CMP updated on 06/29/24.  No recurrent infections.  Discussed the importance of holding arava  if she develops signs or symptoms of an infection and to resume once the infection has completely cleared.   PLQ Eye exam: 10/13/2023 WNL.   - Plan: Basic Metabolic Panel (BMET)  Elevated serum creatinine -Creatinine was 1.14 and GFR was 50 on 06/29/2024.  Plan to update BMP with GFR today.  Plan: Basic Metabolic Panel  Mixed hyperlipidemia: Elevated  LPA--Evaluated by Dr. Francyne and Dr. Mona.  She will be starting on Repatha .  She is also now taking aspirin  81 mg daily.   Primary osteoarthritis of both hands: She has PIP and DIP thickening consistent with osteoarthritis of both hands.  No synovitis noted.  Complete fist formation bilaterally.  History of fracture of right hip:  Not currently symptomatic.   Primary osteoarthritis of  both knees: She has good range of motion of both knee joints on examination today.  No warmth or effusion noted.  Primary osteoarthritis of both feet: She has good range of motion of both ankle joints with no tenderness or joint swelling. She is wearing proper fitting shoes.   Osteoporosis of multiple sites - She remains on Prolia  60 mg sq injections once every 6 months.  She is taking a calcium supplement daily.  Other medical conditions are listed as follows:   History of pleural effusion  History of bronchiectasis - HRCT October 2023 by Dr. Alaine.  Patient will follow-up with Dr. Alaine.  Paroxysmal atrial fibrillation (HCC)  Orders: Orders Placed This Encounter  Procedures   Basic Metabolic Panel (BMET)   No orders of the defined types were placed in this encounter.    Follow-Up Instructions: Return in about 5 months (around 12/23/2024) for Rheumatoid arthritis.   Waddell CHRISTELLA Craze, PA-C  Note - This record has been created using Dragon software.  Chart creation errors have been sought, but may not always  have been located. Such creation errors do not reflect on  the standard of medical care.     [1]  Social History Tobacco Use   Smoking status: Never    Passive exposure: Past   Smokeless tobacco: Never  Vaping Use   Vaping status: Never Used  Substance Use Topics   Alcohol  use: No    Alcohol /week: 0.0 standard drinks of alcohol    Drug use: No   "

## 2024-07-12 ENCOUNTER — Encounter: Payer: Self-pay | Admitting: Cardiovascular Disease

## 2024-07-12 ENCOUNTER — Ambulatory Visit: Attending: Cardiovascular Disease | Admitting: Cardiovascular Disease

## 2024-07-12 VITALS — BP 150/84 | HR 74 | Ht 66.0 in | Wt 126.0 lb

## 2024-07-12 DIAGNOSIS — I480 Paroxysmal atrial fibrillation: Secondary | ICD-10-CM

## 2024-07-12 DIAGNOSIS — I491 Atrial premature depolarization: Secondary | ICD-10-CM

## 2024-07-12 DIAGNOSIS — E782 Mixed hyperlipidemia: Secondary | ICD-10-CM | POA: Diagnosis not present

## 2024-07-12 NOTE — Patient Instructions (Addendum)
 Medication Instructions:  No changes *If you need a refill on your cardiac medications before your next appointment, please call your pharmacy*  Lab Work: None ordered If you have labs (blood work) drawn today and your tests are completely normal, you will receive your results only by: MyChart Message (if you have MyChart) OR A paper copy in the mail If you have any lab test that is abnormal or we need to change your treatment, we will call you to review the results.  Testing/Procedures: None ordered  Follow-Up: At [facility], you and your health needs are our priority.  As part of our continuing mission to provide you with exceptional heart care, our providers are all part of one team.  This team includes your primary Cardiologist (physician) and Advanced Practice Providers or APPs (Physician Assistants and Nurse Practitioners) who all work together to provide you with the care you need, when you need it.  Your next appointment:    Dr Mona- Needs appointment for elevated LPa  We recommend signing up for the patient portal called MyChart.  Sign up information is provided on this After Visit Summary.  MyChart is used to connect with patients for Virtual Visits (Telemedicine).  Patients are able to view lab/test results, encounter notes, upcoming appointments, etc.  Non-urgent messages can be sent to your provider as well.   To learn more about what you can do with MyChart, go to [email or web address].

## 2024-07-16 NOTE — Progress Notes (Signed)
 " Cardiology Office Note   Date:  07/16/2024  ID:  [name] [name], DOB 02-Mar-1951, MRN [id number] PCP: Stephane Leita DEL, MD  [facility] Providers Cardiologist:  None     History of Present Illness [name] [name] is a 74 y.o. female who presents for evaluation of her heart health and elevated lipoprotein(a) levels.  She is concerned about her heart health due to her family history of heart disease, particularly her father's early death at age 80. She has a history of rheumatoid arthritis diagnosed in 2016 and high cholesterol. During her recent annual physical, she expressed concerns about her heart health, leading to further testing.  She underwent a CT scan to measure coronary artery calcification, which showed a calcium score of 5, placing her in the 32nd percentile for age and gender. Blood tests to evaluate her cholesterol levels showed an LDL of 102 and a total cholesterol of 197 in October, with a recent test showing an LDL of 103 and total cholesterol of 204. Her lipoprotein(a) levels were found to be significantly elevated.  She is aware of her heart at times but has no chest pain or trouble breathing. She describes occasional sensations in her chest that do not hurt.  She has been on statins, which she tolerates well without side effects. She maintains an active lifestyle, walking her dogs daily and engaging in gardening activities. She previously rode her bicycle regularly but reduced this activity after a hip fracture from a fall in 2016, which was treated with pins.  Family history is significant for her father's early death from heart disease. Her sister has had a calcium score test with all zeros. She is concerned about her children, who have a 50% chance of inheriting elevated lipoprotein(a) levels.  In 2016 her echocardiogram showed normal left ventricular systolic function with EF 60-65% and no significant valvular abnormalities.  That same year she had a carotid duplex ultrasound  that demonstrated mild plaque and did not show any evidence of significant carotid lesions.   Studies Reviewed EKG Interpretation Date/Time:  Tuesday July 12 2024 11:54:16 EST Ventricular Rate:  74 PR Interval:  148 QRS Duration:  84 QT Interval:  376 QTC Calculation: 417 R Axis:   59  Text Interpretation: Sinus rhythm with Premature atrial complexes Nonspecific ST abnormality When compared with ECG of 03-Apr-2015 19:31, PREVIOUS ECG IS PRESENT Confirmed by Baylon Santelli [phone]) on 07/12/2024 12:05:34 PM     Risk Assessment/Calculations        Physical Exam VS:  BP 134/78   Pulse 74   Ht 5' 6 (1.676 m)   Wt 126 lb (57.2 kg)   LMP 07/14/1993   SpO2 98%   BMI 20.34 kg/m        Wt Readings from Last 3 Encounters:  07/12/24 126 lb (57.2 kg)  02/23/24 129 lb (58.5 kg)  09/23/23 130 lb (59 kg)    GEN: Well nourished, well developed in no acute distress NECK: No JVD; No carotid bruits CARDIAC: RRR, no murmurs, rubs, gallops RESPIRATORY:  Clear to auscultation without rales, wheezing or rhonchi  ABDOMEN: Soft, non-tender, non-distended EXTREMITIES:  No edema; No deformity   ASSESSMENT AND PLAN  Lipoprotein(a) levels are significantly elevated, posing a risk for atherosclerosis and clotting. Despite this, her coronary calcium score is low, indicating a lower risk of heart attack than expected for her age and risk factors. Current statin therapy does not affect lipoprotein(a) levels. Newer drugs like Repatha and Praluent can reduce lipoprotein(a) by  about 25%, but upcoming clinical trials may offer more significant reductions, apparently with minimal side effects. Referred to Dr. Mona for further evaluation and potential participation in clinical trials for new lipoprotein(a) lowering drugs.  On the other hand, her coronary calcium score was quite low and her LDL is in acceptable range on statins.  Recommend that she continue current statin therapy until further  recommendations from Dr. Mona.  Continue healthy diet and exercise. PACs: Incidentally noted on ECG.  Asymptomatic.  No specific treatment necessary.     Dispo: Referred to lipid clinic.  Signed, Jerel Balding, MD   "

## 2024-07-21 ENCOUNTER — Ambulatory Visit (HOSPITAL_BASED_OUTPATIENT_CLINIC_OR_DEPARTMENT_OTHER): Admitting: Internal Medicine

## 2024-07-21 ENCOUNTER — Encounter (HOSPITAL_BASED_OUTPATIENT_CLINIC_OR_DEPARTMENT_OTHER): Payer: Self-pay | Admitting: Internal Medicine

## 2024-07-21 VITALS — BP 140/70 | HR 94 | Ht 66.0 in | Wt 126.1 lb

## 2024-07-21 DIAGNOSIS — I2510 Atherosclerotic heart disease of native coronary artery without angina pectoris: Secondary | ICD-10-CM

## 2024-07-21 DIAGNOSIS — E785 Hyperlipidemia, unspecified: Secondary | ICD-10-CM

## 2024-07-21 DIAGNOSIS — E7841 Elevated Lipoprotein(a): Secondary | ICD-10-CM

## 2024-07-21 MED ORDER — ASPIRIN 81 MG PO TBEC: 81.0000 mg | DELAYED_RELEASE_TABLET | Freq: Every day | ORAL | Status: AC

## 2024-07-21 NOTE — Progress Notes (Signed)
 "   LIPID CLINIC CONSULT NOTE  Chief Complaint:  Manage dyslipidemia  Primary Care Physician: [name] Leita DEL, MD  Primary Cardiologist:  None  HPI:  [name] [name] is a 74 y.o. female who is being seen today for the evaluation of dyslipidemia at the request of [name] [name], Jerel, MD. this is a pleasant 74 year old female kindly referred for evaluation management of dyslipidemia.  She has a history of mildly elevated cholesterol with recent LDL of 102 on rosuvastatin 20 mg daily but was subsequently found to have a high LP(a) of 572 nmol/L.  Additionally, she had a coronary calcium score which was low at 5, 32nd percentile for age and sex matched controls.  Her cholesterol actually would seem fairly well treated based on the low-level coronary calcium however LP(a) is a risk enhancer and therefore would I would recommend a much lower LDL target of less than 55.  Based on that she needs additional lipid-lowering and would be a good candidate for PCSK9 inhibitor therapy.  We also discussed the procoagulant properties of LP(a) and suggestion that aspirin  may mitigate this.  PMHx:  Past Medical History:  Diagnosis Date   Elevated cholesterol    Migraines    occular   Osteoporosis 2015   based on hip fracture from fall  DEXA 01/2018 T score -2.3   Pleural effusion 9/206   Bilateral effusions, s/p thora on R c/w exudate   Pneumonia 03/2015   R   RA (rheumatoid arthritis) (HCC)    Skin cancer (melanoma) (HCC)    Right Arm, nose.     Past Surgical History:  Procedure Laterality Date   COLONOSCOPY WITH PROPOFOL  N/A 11/04/2016   Procedure: COLONOSCOPY WITH PROPOFOL ;  Surgeon: Gladis MARLA Louder, MD;  Location: [facility];  Service: Endoscopy;  Laterality: N/A;   FINGER SURGERY     HIP ARTHROPLASTY Right 11/14/2013   Procedure: RIGHT CANNULATED HIP PINNING;  Surgeon: [name] LITTIE Gavel, MD;  Location: [facility];  Service: Orthopedics;  Laterality: Right;  BIOMET   SQUAMOUS CELL CARCINOMA EXCISION  2021    behind knee    SQUAMOUS CELL CARCINOMA EXCISION Left 11/2022   left hand   VIDEO BRONCHOSCOPY Bilateral 04/05/2015   Procedure: VIDEO BRONCHOSCOPY WITHOUT FLUORO;  Surgeon: [name] KATHEE Lennert, MD;  Location: [facility];  Service: Cardiopulmonary;  Laterality: Bilateral;    FAMHx:  Family History  Problem Relation Age of Onset   Heart attack Father 44   Heart disease Father    Osteoporosis Sister    High Cholesterol Sister    Melanoma Daughter    Melanoma Daughter     SOCHx:   reports that she has never smoked. She has been exposed to tobacco smoke. She has never used smokeless tobacco. She reports that she does not drink alcohol  and does not use drugs.  ALLERGIES:  Allergies[1]  ROS: Pertinent items noted in HPI and remainder of comprehensive ROS otherwise negative.  HOME MEDS: Medications Ordered Prior to Encounter[2]  LABS/IMAGING: No results found for this or any previous visit (from the past 48 hours). No results found.  LIPID PANEL:    Component Value Date/Time   CHOL 204 (H) 07/13/2023 1558   TRIG 64 07/13/2023 1558   HDL 86 07/13/2023 1558   CHOLHDL 2.4 07/13/2023 1558   VLDL 20.6 05/25/2015 0828   LDLCALC 103 (H) 07/13/2023 1558    No results found for: LIPOA   WEIGHTS: Wt Readings from Last 3 Encounters:  07/21/24 126 lb 1.6 oz (57.2  kg)  07/12/24 126 lb (57.2 kg)  02/23/24 129 lb (58.5 kg)    VITALS: BP (!) 140/70   Pulse 94   Ht 5' 6 (1.676 m)   Wt 126 lb 1.6 oz (57.2 kg)   LMP 07/14/1993   SpO2 97%   BMI 20.35 kg/m   EXAM: Deferred  EKG: Deferred  ASSESSMENT: Dyslipidemia, goal LDL less than 55 CAC score of 5, 32nd percentile for age and sex matched controls Elevated LP(a) at 572 nmol/L  PLAN: 1.   [name] [name] has a dyslipidemia with a target LDL less than 55 due to significant LP(a) elevation.  Because of procoagulant properties of the molecule I would advise adding a aspirin  81 mg daily.  I had also recommend starting Repatha   in addition to her statin for additional cholesterol lowering.  This may also reduce LP(a).  Will check an NMR and LP(a) in about 3 months and she can follow-up with Dr. JAYSON afterwards.  Unfortunately, I do not believe that she would qualify for our preventative LP(a) lowering trial due to her relatively low level of coronary calcium however I will forward this on to our research nurse to see if she might qualify.  She would need to be on therapy for at least a month with a PCSK9 here before she could qualify.  Thanks as always for the kind referral.  [name] KYM Maxcy, MD, [facility], FNLA, FACP  [facility]  Medical Director of the Advanced Lipid Disorders &  Cardiovascular Risk Reduction Clinic Diplomate of the American Board of Clinical Lipidology Attending Cardiologist  Direct Dial: [phone]  Fax: [phone]  Website:  [email or web address]  [name] [name] 07/21/2024, 3:31 PM      [1]  Allergies Allergen Reactions   Levaquin  [Levofloxacin ]     Joint pain/soreness, pain in hands, calf's.   Cephalexin Rash  [2]  Current Outpatient Medications on File Prior to Visit  Medication Sig Dispense Refill   Calcium Carbonate (CALTRATE 600 PO) Take by mouth daily.     denosumab  (PROLIA ) 60 MG/ML SOSY injection as directed Subcutaneous     hydroxychloroquine  (PLAQUENIL ) 200 MG tablet TAKE 1 TABLET BY MOUTH TWICE DAILY ON MONDAY THROUGH FRIDAY 120 tablet 0   leflunomide  (ARAVA ) 10 MG tablet TAKE 1 TABLET(10 MG) BY MOUTH DAILY 90 tablet 0   rosuvastatin (CRESTOR) 20 MG tablet Take 20 mg by mouth at bedtime.     Turmeric 400 MG CAPS Take 1 capsule by mouth daily.     No current facility-administered medications on file prior to visit.   "

## 2024-07-21 NOTE — Patient Instructions (Addendum)
 Medication Instructions:  Aspirin  81 mg - one tablet daily, and  Dr. Mona recommends Repatha  (PCSK9). This is an injectable cholesterol medication self-administered once every 14 days. This medication will likely need prior approval with your insurance company, which we will work on. If the medication is not approved initially, we may need to do an appeal with your insurance. If approved, we will provide you with copay and cost information. We'll then send the prescription to your pharmacy. We would have you complete another set of fasting labs between 3-4 months to reassess cholesterol.   Repatha  is self-injected once every 14 days in subcutaneous or fatty tissue - such as belly or side/outer/upper thigh. It is best stored in the refrigerator but is stable at room temp up to 28 days. Please take the pen-injector out of fridge about 30 minutes - 1 hour prior to injection, to allow it to warm closer to room temperature.  Repatha , a monoclonal antibody therapy, lowers LDL by an average of 55-63%. It can also lower LP(a). It has also been studied and demonstrates cardiovascular risk reduction in persons with and without a prior cardiac history (such as heart attack or stroke). Here is more info: https://www.repatha .com/what-is-repatha   PCSK9 (a protein) binds to LDL receptors in the liver which results in breakdown of the LDL receptor. This prevents the liver from clearing out LDL (bad cholesterol). Repatha  is a PCSK9 inhibitor, meaning it blocks this pathway.   Most common side effects:  Injection site reaction (similar findings in study group with medication and placebo group without medication) Runny nose, sore throat or symptoms of common cold which are often self-limiting and improve after subsequent injections as your body gets accustomed to the medication.    Here is a demo video: https://www.repatha .com/how-to-start-repatha -injection   If you need a co-pay card for Repatha :  https://www.repatha .com/repatha -cost   Patient Assistance:    These foundations have funds at various times.   The [employer] Foundation: https://www.panfoundation.org/disease-funds/hypercholesterolemia/ -- can sign up for wait list  The [facility] offers assistance to help pay for medication copays.  They will cover copays for all cholesterol lowering meds, including statins, fibrates, omega-3 fish oils like Vascepa, ezetimibe, Repatha , Praluent, Nexletol, Nexlizet.  The cards are usually good for $2,500 or 12 months, whichever comes first. Our fax # is [phone] (you will need this to apply) Go to healthwellfoundation.org Click on Apply Now Answer questions as to whom is applying (patient or representative) Your disease fund will be hypercholesterolemia - Medicare access They will ask questions about finances and which medications you are taking for cholesterol When you submit, the approval is usually within minutes.  You will need to print the card information from the site You will need to show this information to your pharmacy, they will bill your Medicare Part D plan first -then bill [employer] --for the copay.   You can also call them at [phone], although the hold times can be quite long.    *If you need a refill on your cardiac medications before your next appointment, please call your pharmacy*  Lab Work: Your physician recommends that you return for lab work in: 3 months   NMR Lipoprofile and Lp(a)   Testing/Procedures: none  Follow-Up: As needed   Addendum: From pharmacy PA team:  Received notification from [facility] [employer] that Prior Authorization for repatha  has been APPROVED from 07/22/24 to 01/18/25. Ran test claim, Copay is $112.07- one month. This test claim was processed through [facility]- copay  amounts may vary at other pharmacies due to pharmacy/plan contracts, or as the patient moves through the different  stages of their insurance plan. PA #/Case ID/Reference #: [id number]   Patient notified via MyChart and medication sent to pharmacy.   Tanda Petri, RN  07/22/2024 11:28 AM

## 2024-07-22 ENCOUNTER — Telehealth: Payer: Self-pay | Admitting: Pharmacy Technician

## 2024-07-22 ENCOUNTER — Other Ambulatory Visit (HOSPITAL_COMMUNITY): Payer: Self-pay

## 2024-07-22 MED ORDER — REPATHA SURECLICK 140 MG/ML ~~LOC~~ SOAJ
140.0000 mg | SUBCUTANEOUS | 3 refills | Status: AC
  Filled 2024-08-14: qty 6, 84d supply, fill #0

## 2024-07-22 NOTE — Telephone Encounter (Signed)
 Patient notified via MyChart.   Medication sent.

## 2024-07-22 NOTE — Telephone Encounter (Signed)
 Pharmacy Patient Advocate Encounter   Received notification from Physician's Office michalene that prior authorization for repatha  is required/requested.   Insurance verification completed.   The patient is insured through [facility] ADVANTAGE/RX ADVANCE.   Per test claim: PA required; PA submitted to above mentioned insurance via Latent Key/confirmation #/EOC [id number] Status is pending

## 2024-07-22 NOTE — Telephone Encounter (Signed)
 Pharmacy Patient Advocate Encounter  Received notification from [employer] that Prior Authorization for repatha  has been APPROVED from 07/22/24 to 01/18/25. Ran test claim, Copay is $112.07- one month. This test claim was processed through [facility]- copay amounts may vary at other pharmacies due to pharmacy/plan contracts, or as the patient moves through the different stages of their insurance plan.   PA #/Case ID/Reference #: [id number]

## 2024-07-22 NOTE — Addendum Note (Signed)
 Addended by: Marlee Trentman on: 07/22/2024 11:35 AM   Modules accepted: Orders

## 2024-07-25 ENCOUNTER — Ambulatory Visit: Attending: Physician Assistant | Admitting: Physician Assistant

## 2024-07-25 ENCOUNTER — Encounter: Payer: Self-pay | Admitting: Physician Assistant

## 2024-07-25 VITALS — BP 131/80 | HR 87 | Temp 98.3°F | Resp 14 | Ht 66.0 in | Wt 123.6 lb

## 2024-07-25 DIAGNOSIS — E782 Mixed hyperlipidemia: Secondary | ICD-10-CM | POA: Diagnosis not present

## 2024-07-25 DIAGNOSIS — Z8781 Personal history of (healed) traumatic fracture: Secondary | ICD-10-CM | POA: Diagnosis not present

## 2024-07-25 DIAGNOSIS — Z8709 Personal history of other diseases of the respiratory system: Secondary | ICD-10-CM

## 2024-07-25 DIAGNOSIS — M19042 Primary osteoarthritis, left hand: Secondary | ICD-10-CM

## 2024-07-25 DIAGNOSIS — R7989 Other specified abnormal findings of blood chemistry: Secondary | ICD-10-CM | POA: Diagnosis not present

## 2024-07-25 DIAGNOSIS — M810 Age-related osteoporosis without current pathological fracture: Secondary | ICD-10-CM

## 2024-07-25 DIAGNOSIS — M170 Bilateral primary osteoarthritis of knee: Secondary | ICD-10-CM

## 2024-07-25 DIAGNOSIS — M19041 Primary osteoarthritis, right hand: Secondary | ICD-10-CM | POA: Diagnosis not present

## 2024-07-25 DIAGNOSIS — Z79899 Other long term (current) drug therapy: Secondary | ICD-10-CM | POA: Diagnosis not present

## 2024-07-25 DIAGNOSIS — I480 Paroxysmal atrial fibrillation: Secondary | ICD-10-CM | POA: Diagnosis not present

## 2024-07-25 DIAGNOSIS — M19072 Primary osteoarthritis, left ankle and foot: Secondary | ICD-10-CM

## 2024-07-25 DIAGNOSIS — M19071 Primary osteoarthritis, right ankle and foot: Secondary | ICD-10-CM

## 2024-07-25 DIAGNOSIS — Z8639 Personal history of other endocrine, nutritional and metabolic disease: Secondary | ICD-10-CM

## 2024-07-25 DIAGNOSIS — M069 Rheumatoid arthritis, unspecified: Secondary | ICD-10-CM | POA: Diagnosis not present

## 2024-07-26 ENCOUNTER — Ambulatory Visit: Payer: Self-pay | Admitting: Physician Assistant

## 2024-07-26 LAB — BASIC METABOLIC PANEL WITH GFR
BUN: 16 mg/dL (ref 7–25)
CO2: 32 mmol/L (ref 20–32)
Calcium: 9.8 mg/dL (ref 8.6–10.4)
Chloride: 103 mmol/L (ref 98–110)
Creat: 0.66 mg/dL (ref 0.60–1.00)
Glucose, Bld: 128 mg/dL — ABNORMAL HIGH (ref 65–99)
Potassium: 4.5 mmol/L (ref 3.5–5.3)
Sodium: 142 mmol/L (ref 135–146)
eGFR: 91 mL/min/1.73m2

## 2024-07-26 NOTE — Progress Notes (Signed)
 Glucose is 128, rest of BMP WNL

## 2024-08-09 ENCOUNTER — Other Ambulatory Visit (HOSPITAL_COMMUNITY): Payer: Self-pay

## 2024-08-14 ENCOUNTER — Other Ambulatory Visit (HOSPITAL_COMMUNITY): Payer: Self-pay

## 2024-08-14 ENCOUNTER — Other Ambulatory Visit: Payer: Self-pay

## 2024-08-14 MED ORDER — ROSUVASTATIN CALCIUM 20 MG PO TABS
20.0000 mg | ORAL_TABLET | Freq: Every day | ORAL | 1 refills | Status: AC
  Filled 2024-08-14: qty 90, 90d supply, fill #0

## 2024-08-15 ENCOUNTER — Other Ambulatory Visit (HOSPITAL_COMMUNITY): Payer: Self-pay

## 2024-08-15 ENCOUNTER — Other Ambulatory Visit: Payer: Self-pay

## 2024-08-17 ENCOUNTER — Other Ambulatory Visit: Payer: Self-pay

## 2024-08-17 ENCOUNTER — Other Ambulatory Visit (HOSPITAL_COMMUNITY): Payer: Self-pay

## 2024-08-17 DIAGNOSIS — M069 Rheumatoid arthritis, unspecified: Secondary | ICD-10-CM

## 2024-08-17 MED ORDER — HYDROXYCHLOROQUINE SULFATE 200 MG PO TABS
ORAL_TABLET | ORAL | 0 refills | Status: AC
  Filled 2024-08-17: qty 120, 84d supply, fill #0

## 2024-08-17 MED ORDER — LEFLUNOMIDE 10 MG PO TABS
10.0000 mg | ORAL_TABLET | Freq: Every day | ORAL | 0 refills | Status: AC
  Filled 2024-08-17: qty 90, 90d supply, fill #0

## 2024-08-17 NOTE — Telephone Encounter (Signed)
 Patient left a voicemail stating she is changing her pharmacy to [facility] and needs new prescriptions sent in.   Last Fill: 05/18/2024 (arava ) and 07/04/2024 (PLQ)  Eye exam: 10/13/2023   Labs: 07/25/2024 BMP: glucose 128 06/29/2024 CBC and CMP are stable except creatinine is elevated. Patient should avoid all NSAIDs and increase water intake. I would recommend repeat BMP in 1 month.   Next Visit: 12/26/2024  Last Visit: 07/25/2024  IK:Myzlfjunpi arthritis involving multiple joints   Current Dose per office note on 07/25/2024: Arava  10 mg p.o. daily, Plaquenil  200 mg twice daily Monday to Friday.   Okay to refill Plaquenil  and Arava ?

## 2024-08-30 ENCOUNTER — Institutional Professional Consult (permissible substitution) (HOSPITAL_BASED_OUTPATIENT_CLINIC_OR_DEPARTMENT_OTHER): Admitting: Internal Medicine

## 2024-12-26 ENCOUNTER — Ambulatory Visit: Admitting: Physician Assistant
